# Patient Record
Sex: Male | Born: 1955 | Race: Black or African American | Hispanic: No | Marital: Married
Health system: Southern US, Community
[De-identification: ages and names within clinical notes are randomized; demographics above are authoritative.]

## PROBLEM LIST (undated history)

## (undated) DIAGNOSIS — I639 Cerebral infarction, unspecified: Secondary | ICD-10-CM

## (undated) DIAGNOSIS — D649 Anemia, unspecified: Secondary | ICD-10-CM

## (undated) DIAGNOSIS — Z720 Tobacco use: Secondary | ICD-10-CM

## (undated) DIAGNOSIS — E119 Type 2 diabetes mellitus without complications: Secondary | ICD-10-CM

## (undated) DIAGNOSIS — I509 Heart failure, unspecified: Secondary | ICD-10-CM

## (undated) DIAGNOSIS — N186 End stage renal disease: Secondary | ICD-10-CM

## (undated) DIAGNOSIS — E1143 Type 2 diabetes mellitus with diabetic autonomic (poly)neuropathy: Secondary | ICD-10-CM

## (undated) DIAGNOSIS — J90 Pleural effusion, not elsewhere classified: Secondary | ICD-10-CM

## (undated) DIAGNOSIS — G35 Multiple sclerosis: Secondary | ICD-10-CM

## (undated) HISTORY — PX: VIDEO ASSISTED THORACOSCOPY (VATS)/DECORTICATION: SHX6171

---

## 2019-01-28 ENCOUNTER — Inpatient Hospital Stay (HOSPITAL_COMMUNITY)
Admission: AD | Admit: 2019-01-28 | Discharge: 2019-02-17 | DRG: 186 | Disposition: A | Discharge status: Skilled Nursing Facility | Payer: Medicare PPO | Source: Other Acute Inpatient Hospital | Attending: Internal Medicine | Admitting: Internal Medicine

## 2019-01-28 ENCOUNTER — Encounter (HOSPITAL_COMMUNITY): Payer: Self-pay | Admitting: General Surgery

## 2019-01-28 ENCOUNTER — Inpatient Hospital Stay (HOSPITAL_COMMUNITY): Payer: Medicare PPO

## 2019-01-28 DIAGNOSIS — J948 Other specified pleural conditions: Secondary | ICD-10-CM | POA: Diagnosis present

## 2019-01-28 DIAGNOSIS — J9611 Chronic respiratory failure with hypoxia: Secondary | ICD-10-CM | POA: Diagnosis present

## 2019-01-28 DIAGNOSIS — I5032 Chronic diastolic (congestive) heart failure: Secondary | ICD-10-CM | POA: Diagnosis present

## 2019-01-28 DIAGNOSIS — R6510 Systemic inflammatory response syndrome (SIRS) of non-infectious origin without acute organ dysfunction: Secondary | ICD-10-CM | POA: Diagnosis present

## 2019-01-28 DIAGNOSIS — R4182 Altered mental status, unspecified: Secondary | ICD-10-CM | POA: Diagnosis not present

## 2019-01-28 DIAGNOSIS — Z79899 Other long term (current) drug therapy: Secondary | ICD-10-CM

## 2019-01-28 DIAGNOSIS — R933 Abnormal findings on diagnostic imaging of other parts of digestive tract: Secondary | ICD-10-CM

## 2019-01-28 DIAGNOSIS — K253 Acute gastric ulcer without hemorrhage or perforation: Secondary | ICD-10-CM

## 2019-01-28 DIAGNOSIS — E43 Unspecified severe protein-calorie malnutrition: Secondary | ICD-10-CM | POA: Insufficient documentation

## 2019-01-28 DIAGNOSIS — D509 Iron deficiency anemia, unspecified: Secondary | ICD-10-CM | POA: Diagnosis present

## 2019-01-28 DIAGNOSIS — I160 Hypertensive urgency: Secondary | ICD-10-CM | POA: Diagnosis not present

## 2019-01-28 DIAGNOSIS — K7460 Unspecified cirrhosis of liver: Secondary | ICD-10-CM | POA: Diagnosis present

## 2019-01-28 DIAGNOSIS — G35 Multiple sclerosis: Secondary | ICD-10-CM | POA: Diagnosis present

## 2019-01-28 DIAGNOSIS — J9601 Acute respiratory failure with hypoxia: Secondary | ICD-10-CM | POA: Diagnosis not present

## 2019-01-28 DIAGNOSIS — Z6821 Body mass index (BMI) 21.0-21.9, adult: Secondary | ICD-10-CM | POA: Diagnosis not present

## 2019-01-28 DIAGNOSIS — K5730 Diverticulosis of large intestine without perforation or abscess without bleeding: Secondary | ICD-10-CM | POA: Diagnosis present

## 2019-01-28 DIAGNOSIS — G9340 Encephalopathy, unspecified: Secondary | ICD-10-CM | POA: Diagnosis present

## 2019-01-28 DIAGNOSIS — N2581 Secondary hyperparathyroidism of renal origin: Secondary | ICD-10-CM | POA: Diagnosis present

## 2019-01-28 DIAGNOSIS — G9341 Metabolic encephalopathy: Secondary | ICD-10-CM | POA: Diagnosis present

## 2019-01-28 DIAGNOSIS — E1122 Type 2 diabetes mellitus with diabetic chronic kidney disease: Secondary | ICD-10-CM | POA: Diagnosis present

## 2019-01-28 DIAGNOSIS — J90 Pleural effusion, not elsewhere classified: Secondary | ICD-10-CM

## 2019-01-28 DIAGNOSIS — I132 Hypertensive heart and chronic kidney disease with heart failure and with stage 5 chronic kidney disease, or end stage renal disease: Secondary | ICD-10-CM | POA: Diagnosis present

## 2019-01-28 DIAGNOSIS — E785 Hyperlipidemia, unspecified: Secondary | ICD-10-CM | POA: Diagnosis present

## 2019-01-28 DIAGNOSIS — E876 Hypokalemia: Secondary | ICD-10-CM | POA: Diagnosis present

## 2019-01-28 DIAGNOSIS — K259 Gastric ulcer, unspecified as acute or chronic, without hemorrhage or perforation: Secondary | ICD-10-CM | POA: Diagnosis present

## 2019-01-28 DIAGNOSIS — D631 Anemia in chronic kidney disease: Secondary | ICD-10-CM | POA: Diagnosis present

## 2019-01-28 DIAGNOSIS — A419 Sepsis, unspecified organism: Principal | ICD-10-CM | POA: Diagnosis present

## 2019-01-28 DIAGNOSIS — I10 Essential (primary) hypertension: Secondary | ICD-10-CM | POA: Diagnosis not present

## 2019-01-28 DIAGNOSIS — F17200 Nicotine dependence, unspecified, uncomplicated: Secondary | ICD-10-CM | POA: Diagnosis present

## 2019-01-28 DIAGNOSIS — K3184 Gastroparesis: Secondary | ICD-10-CM | POA: Diagnosis present

## 2019-01-28 DIAGNOSIS — K219 Gastro-esophageal reflux disease without esophagitis: Secondary | ICD-10-CM | POA: Diagnosis present

## 2019-01-28 DIAGNOSIS — K56609 Unspecified intestinal obstruction, unspecified as to partial versus complete obstruction: Secondary | ICD-10-CM | POA: Diagnosis not present

## 2019-01-28 DIAGNOSIS — F0390 Unspecified dementia without behavioral disturbance: Secondary | ICD-10-CM | POA: Diagnosis present

## 2019-01-28 DIAGNOSIS — K551 Chronic vascular disorders of intestine: Secondary | ICD-10-CM | POA: Diagnosis not present

## 2019-01-28 DIAGNOSIS — E1143 Type 2 diabetes mellitus with diabetic autonomic (poly)neuropathy: Secondary | ICD-10-CM | POA: Diagnosis present

## 2019-01-28 DIAGNOSIS — Z20828 Contact with and (suspected) exposure to other viral communicable diseases: Secondary | ICD-10-CM | POA: Diagnosis present

## 2019-01-28 DIAGNOSIS — N186 End stage renal disease: Secondary | ICD-10-CM | POA: Diagnosis not present

## 2019-01-28 DIAGNOSIS — E162 Hypoglycemia, unspecified: Secondary | ICD-10-CM | POA: Diagnosis not present

## 2019-01-28 DIAGNOSIS — E11649 Type 2 diabetes mellitus with hypoglycemia without coma: Secondary | ICD-10-CM | POA: Diagnosis not present

## 2019-01-28 DIAGNOSIS — R112 Nausea with vomiting, unspecified: Secondary | ICD-10-CM | POA: Diagnosis not present

## 2019-01-28 DIAGNOSIS — F329 Major depressive disorder, single episode, unspecified: Secondary | ICD-10-CM | POA: Diagnosis present

## 2019-01-28 DIAGNOSIS — Z992 Dependence on renal dialysis: Secondary | ICD-10-CM

## 2019-01-28 DIAGNOSIS — I69354 Hemiplegia and hemiparesis following cerebral infarction affecting left non-dominant side: Secondary | ICD-10-CM | POA: Diagnosis not present

## 2019-01-28 DIAGNOSIS — Z888 Allergy status to other drugs, medicaments and biological substances status: Secondary | ICD-10-CM

## 2019-01-28 DIAGNOSIS — L8990 Pressure ulcer of unspecified site, unspecified stage: Secondary | ICD-10-CM | POA: Insufficient documentation

## 2019-01-28 DIAGNOSIS — R579 Shock, unspecified: Secondary | ICD-10-CM | POA: Diagnosis not present

## 2019-01-28 DIAGNOSIS — I959 Hypotension, unspecified: Secondary | ICD-10-CM | POA: Diagnosis not present

## 2019-01-28 DIAGNOSIS — Z794 Long term (current) use of insulin: Secondary | ICD-10-CM

## 2019-01-28 DIAGNOSIS — R5381 Other malaise: Secondary | ICD-10-CM | POA: Diagnosis present

## 2019-01-28 DIAGNOSIS — E875 Hyperkalemia: Secondary | ICD-10-CM | POA: Diagnosis present

## 2019-01-28 DIAGNOSIS — Z8711 Personal history of peptic ulcer disease: Secondary | ICD-10-CM

## 2019-01-28 DIAGNOSIS — Z880 Allergy status to penicillin: Secondary | ICD-10-CM

## 2019-01-28 DIAGNOSIS — Z539 Procedure and treatment not carried out, unspecified reason: Secondary | ICD-10-CM | POA: Diagnosis not present

## 2019-01-28 DIAGNOSIS — L89312 Pressure ulcer of right buttock, stage 2: Secondary | ICD-10-CM | POA: Diagnosis present

## 2019-01-28 DIAGNOSIS — E860 Dehydration: Secondary | ICD-10-CM | POA: Diagnosis present

## 2019-01-28 DIAGNOSIS — Z7189 Other specified counseling: Secondary | ICD-10-CM | POA: Diagnosis not present

## 2019-01-28 DIAGNOSIS — Z515 Encounter for palliative care: Secondary | ICD-10-CM | POA: Diagnosis not present

## 2019-01-28 HISTORY — DX: Type 2 diabetes mellitus with diabetic autonomic (poly)neuropathy: E11.43

## 2019-01-28 HISTORY — DX: Tobacco use: Z72.0

## 2019-01-28 HISTORY — DX: Type 2 diabetes mellitus without complications: E11.9

## 2019-01-28 HISTORY — DX: Multiple sclerosis: G35

## 2019-01-28 HISTORY — DX: Pleural effusion, not elsewhere classified: J90

## 2019-01-28 HISTORY — DX: End stage renal disease: N18.6

## 2019-01-28 HISTORY — DX: Cerebral infarction, unspecified: I63.9

## 2019-01-28 HISTORY — DX: Heart failure, unspecified: I50.9

## 2019-01-28 HISTORY — DX: Anemia, unspecified: D64.9

## 2019-01-28 LAB — CBC WITH DIFFERENTIAL/PLATELET
Abs Immature Granulocytes: 0.09 10*3/uL — ABNORMAL HIGH (ref 0.00–0.07)
Basophils Absolute: 0 10*3/uL (ref 0.0–0.1)
Basophils Relative: 0 %
Eosinophils Absolute: 0.3 10*3/uL (ref 0.0–0.5)
Eosinophils Relative: 2 %
HCT: 35 % — ABNORMAL LOW (ref 39.0–52.0)
Hemoglobin: 11 g/dL — ABNORMAL LOW (ref 13.0–17.0)
Immature Granulocytes: 1 %
Lymphocytes Relative: 5 %
Lymphs Abs: 0.7 10*3/uL (ref 0.7–4.0)
MCH: 24.6 pg — ABNORMAL LOW (ref 26.0–34.0)
MCHC: 31.4 g/dL (ref 30.0–36.0)
MCV: 78.3 fL — ABNORMAL LOW (ref 80.0–100.0)
Monocytes Absolute: 1.8 10*3/uL — ABNORMAL HIGH (ref 0.1–1.0)
Monocytes Relative: 11 %
Neutro Abs: 12.6 10*3/uL — ABNORMAL HIGH (ref 1.7–7.7)
Neutrophils Relative %: 81 %
Platelets: 263 10*3/uL (ref 150–400)
RBC: 4.47 MIL/uL (ref 4.22–5.81)
RDW: 13.3 % (ref 11.5–15.5)
WBC: 15.5 10*3/uL — ABNORMAL HIGH (ref 4.0–10.5)
nRBC: 0.1 % (ref 0.0–0.2)

## 2019-01-28 LAB — GLUCOSE, CAPILLARY
Glucose-Capillary: 129 mg/dL — ABNORMAL HIGH (ref 70–99)
Glucose-Capillary: 124 mg/dL — ABNORMAL HIGH (ref 70–99)
Glucose-Capillary: 104 mg/dL — ABNORMAL HIGH (ref 70–99)
Glucose-Capillary: 146 mg/dL — ABNORMAL HIGH (ref 70–99)
Glucose-Capillary: 162 mg/dL — ABNORMAL HIGH (ref 70–99)

## 2019-01-28 LAB — COMPREHENSIVE METABOLIC PANEL
ALT: 20 U/L (ref 0–44)
AST: 52 U/L — ABNORMAL HIGH (ref 15–41)
Albumin: 2.7 g/dL — ABNORMAL LOW (ref 3.5–5.0)
Alkaline Phosphatase: 68 U/L (ref 38–126)
Anion gap: 23 — ABNORMAL HIGH (ref 5–15)
BUN: 76 mg/dL — ABNORMAL HIGH (ref 8–23)
CO2: 28 mmol/L (ref 22–32)
Calcium: 9 mg/dL (ref 8.9–10.3)
Chloride: 85 mmol/L — ABNORMAL LOW (ref 98–111)
Creatinine, Ser: 9.24 mg/dL — ABNORMAL HIGH (ref 0.61–1.24)
GFR calc Af Amer: 6 mL/min — ABNORMAL LOW (ref >60)
GFR calc non Af Amer: 5 mL/min — ABNORMAL LOW (ref >60)
Glucose, Bld: 132 mg/dL — ABNORMAL HIGH (ref 70–99)
Potassium: 5.3 mmol/L — ABNORMAL HIGH (ref 3.5–5.1)
Sodium: 136 mmol/L (ref 135–145)
Total Bilirubin: 1.5 mg/dL — ABNORMAL HIGH (ref 0.3–1.2)
Total Protein: 7.1 g/dL (ref 6.5–8.1)

## 2019-01-28 LAB — CORTISOL: Cortisol, Plasma: 21.9 ug/dL

## 2019-01-28 LAB — MRSA PCR SCREENING: MRSA by PCR: NEGATIVE

## 2019-01-28 LAB — MAGNESIUM: Magnesium: 2.2 mg/dL (ref 1.7–2.4)

## 2019-01-28 LAB — SARS CORONAVIRUS 2 BY RT PCR (HOSPITAL ORDER, PERFORMED IN ~~LOC~~ HOSPITAL LAB): SARS Coronavirus 2: NEGATIVE

## 2019-01-28 LAB — ECHOCARDIOGRAM COMPLETE
Height: 71 in
Weight: 2024.7 oz

## 2019-01-28 LAB — PHOSPHORUS: Phosphorus: 8.8 mg/dL — ABNORMAL HIGH (ref 2.5–4.6)

## 2019-01-28 LAB — PROTIME-INR
INR: 1.6 — ABNORMAL HIGH (ref 0.8–1.2)
Prothrombin Time: 18.4 seconds — ABNORMAL HIGH (ref 11.4–15.2)

## 2019-01-28 LAB — PROCALCITONIN: Procalcitonin: 7.08 ng/mL

## 2019-01-28 LAB — VANCOMYCIN, RANDOM: Vancomycin Rm: 30

## 2019-01-28 LAB — LACTIC ACID, PLASMA: Lactic Acid, Venous: 1.1 mmol/L (ref 0.5–1.9)

## 2019-01-28 MED ORDER — INSULIN ASPART 100 UNIT/ML ~~LOC~~ SOLN
0.0000 [IU] | SUBCUTANEOUS | Status: DC
  Administered 2019-01-28: 2 [IU] via SUBCUTANEOUS
  Administered 2019-01-28 (×2): 1 [IU] via SUBCUTANEOUS
  Administered 2019-01-28: 2 [IU] via SUBCUTANEOUS
  Administered 2019-01-29 (×4): 1 [IU] via SUBCUTANEOUS
  Administered 2019-01-30: 2 [IU] via SUBCUTANEOUS
  Administered 2019-01-30 (×2): 1 [IU] via SUBCUTANEOUS
  Administered 2019-01-31 (×2): 2 [IU] via SUBCUTANEOUS
  Administered 2019-01-31: 3 [IU] via SUBCUTANEOUS
  Administered 2019-02-01 (×5): 1 [IU] via SUBCUTANEOUS
  Administered 2019-02-02: 3 [IU] via SUBCUTANEOUS
  Administered 2019-02-02: 1 [IU] via SUBCUTANEOUS
  Administered 2019-02-02: 2 [IU] via SUBCUTANEOUS
  Administered 2019-02-03: 5 [IU] via SUBCUTANEOUS
  Administered 2019-02-03: 2 [IU] via SUBCUTANEOUS
  Administered 2019-02-03: 1 [IU] via SUBCUTANEOUS
  Administered 2019-02-04: 3 [IU] via SUBCUTANEOUS
  Administered 2019-02-04 – 2019-02-05 (×2): 1 [IU] via SUBCUTANEOUS
  Administered 2019-02-05: 2 [IU] via SUBCUTANEOUS
  Administered 2019-02-05: 3 [IU] via SUBCUTANEOUS
  Administered 2019-02-06 – 2019-02-07 (×4): 2 [IU] via SUBCUTANEOUS
  Administered 2019-02-07: 1 [IU] via SUBCUTANEOUS
  Administered 2019-02-07: 2 [IU] via SUBCUTANEOUS
  Administered 2019-02-08: 1 [IU] via SUBCUTANEOUS
  Administered 2019-02-08: 2 [IU] via SUBCUTANEOUS
  Administered 2019-02-08 – 2019-02-09 (×2): 1 [IU] via SUBCUTANEOUS
  Administered 2019-02-09 – 2019-02-10 (×4): 2 [IU] via SUBCUTANEOUS
  Administered 2019-02-10 (×2): 1 [IU] via SUBCUTANEOUS
  Administered 2019-02-10: 2 [IU] via SUBCUTANEOUS
  Administered 2019-02-10: 1 [IU] via SUBCUTANEOUS
  Administered 2019-02-11 – 2019-02-12 (×4): 2 [IU] via SUBCUTANEOUS
  Administered 2019-02-12: 1 [IU] via SUBCUTANEOUS
  Administered 2019-02-12: 2 [IU] via SUBCUTANEOUS
  Administered 2019-02-13 (×2): 1 [IU] via SUBCUTANEOUS
  Administered 2019-02-13: 2 [IU] via SUBCUTANEOUS
  Administered 2019-02-13: 1 [IU] via SUBCUTANEOUS
  Administered 2019-02-14: 2 [IU] via SUBCUTANEOUS
  Administered 2019-02-14: 1 [IU] via SUBCUTANEOUS

## 2019-01-28 MED ORDER — ACETAMINOPHEN 650 MG RE SUPP: 650.0000 mg | Freq: Four times a day (QID) | RECTAL | Status: DC | PRN

## 2019-01-28 MED ORDER — CHLORHEXIDINE GLUCONATE CLOTH 2 % EX PADS
6.0000 | MEDICATED_PAD | Freq: Every day | CUTANEOUS | Status: DC
  Administered 2019-01-29 – 2019-02-07 (×9): 6 via TOPICAL

## 2019-01-28 MED ORDER — PANTOPRAZOLE SODIUM 40 MG IV SOLR
40.0000 mg | INTRAVENOUS | Status: DC
  Administered 2019-01-28 – 2019-01-31 (×4): 40 mg via INTRAVENOUS
  Filled 2019-01-28 (×5): qty 40

## 2019-01-28 MED ORDER — VANCOMYCIN VARIABLE DOSE PER UNSTABLE RENAL FUNCTION (PHARMACIST DOSING): Status: DC

## 2019-01-28 MED ORDER — DEXTROSE-NACL 5-0.9 % IV SOLN
INTRAVENOUS | Status: DC
  Administered 2019-01-28: 1 mL via INTRAVENOUS
  Administered 2019-01-28 – 2019-01-29 (×3): via INTRAVENOUS

## 2019-01-28 MED ORDER — HEPARIN SODIUM (PORCINE) 5000 UNIT/ML IJ SOLN
5000.0000 [IU] | Freq: Three times a day (TID) | INTRAMUSCULAR | Status: DC
  Administered 2019-01-28 – 2019-02-17 (×60): 5000 [IU] via SUBCUTANEOUS
  Filled 2019-01-28 (×59): qty 1

## 2019-01-28 MED ORDER — PROMETHAZINE HCL 25 MG/ML IJ SOLN: 12.5000 mg | Freq: Four times a day (QID) | INTRAMUSCULAR | Status: DC | PRN

## 2019-01-28 MED ORDER — CHLORHEXIDINE GLUCONATE CLOTH 2 % EX PADS
6.0000 | MEDICATED_PAD | Freq: Every day | CUTANEOUS | Status: DC
  Administered 2019-01-29: 6 via TOPICAL

## 2019-01-28 MED ORDER — ORAL CARE MOUTH RINSE
15.0000 mL | Freq: Two times a day (BID) | OROMUCOSAL | Status: DC
  Administered 2019-01-28 – 2019-02-16 (×35): 15 mL via OROMUCOSAL

## 2019-01-28 MED ORDER — ONDANSETRON HCL 4 MG/2ML IJ SOLN
4.0000 mg | Freq: Four times a day (QID) | INTRAMUSCULAR | Status: DC | PRN
  Administered 2019-02-17: 4 mg via INTRAVENOUS
  Filled 2019-01-28: qty 2

## 2019-01-28 MED ORDER — METOCLOPRAMIDE HCL 5 MG/ML IJ SOLN
5.0000 mg | Freq: Four times a day (QID) | INTRAMUSCULAR | Status: DC
  Administered 2019-01-28 – 2019-01-31 (×12): 5 mg via INTRAVENOUS
  Filled 2019-01-28 (×11): qty 2

## 2019-01-28 MED ORDER — LEVOFLOXACIN IN D5W 500 MG/100ML IV SOLN
500.0000 mg | INTRAVENOUS | Status: AC
  Administered 2019-01-28 – 2019-02-03 (×4): 500 mg via INTRAVENOUS
  Filled 2019-01-28 (×5): qty 100

## 2019-01-28 MED ORDER — IOHEXOL 300 MG/ML  SOLN
100.0000 mL | Freq: Once | INTRAMUSCULAR | Status: AC | PRN
  Administered 2019-01-28: 100 mL via INTRAVENOUS

## 2019-01-28 MED ORDER — NYSTATIN 100000 UNIT/ML MT SUSP
5.0000 mL | Freq: Four times a day (QID) | OROMUCOSAL | Status: DC
  Administered 2019-01-28 – 2019-02-17 (×70): 500000 [IU] via ORAL
  Filled 2019-01-28 (×70): qty 5

## 2019-01-28 NOTE — Progress Notes (Signed)
Initial Nutrition Assessment  DOCUMENTATION CODES:   Underweight, Severe malnutrition in context of chronic illness  INTERVENTION:   - If SMA is confirmed, recommend placement of a Cortrak tube with tube tip terminating in the jejunum/at the Ligament of Treitz and initiation of enteral nutrition  - If unable to start enteral nutrition, recommend TPN as pt is severely malnourished  Recommend: - Vital 1.5 @ 55 ml/hr (1320 ml/day)  Tube feeding regimen provides 1980 kcal, 89 grams of protein, and 1008 ml of H2O (100% of needs).  NUTRITION DIAGNOSIS:   Severe Malnutrition related to chronic illness (ESRD on HD, CHF) as evidenced by severe muscle depletion, severe fat depletion.  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 PMH of ESRD on HD, DM, diabetic gastroparesis, GERD, HTN, CHF, MS, CVA with residual left-sided weakness, HLD, anemia.   Discussed pt with RN and during ICU rounds.  Pt currently with NG tube which is clamped at this time.  Per Vascular Surgery note, treatment for SMA would be an attempt at post-pyloric feeding and general surgery evaluation for possible gastrojejunostomy or duodenojejunostomy.  Reviewed Surgery note. CT A/P has been ordered to evaluate for SMA.  Pt lethargic at RD time of visit and was not able to provide any history. Per chart review, pt experiences emesis 3-4 times monthly PTA and had been experiencing emesis at outside hospital.  No weight history in chart.  BP (cuff): 94/44 MAP (cuff):  60  Medications reviewed and include: SSI q 4 hours, Reglan 5 mg q 6 hours, Protonix IVF: D5 in NS @ 75 ml/hr (provides 306 kcal daily from dextrose)  Labs reviewed: potassium 5.3, phosphorus 8.8 CBG's: 104, 124, 129  NUTRITION - FOCUSED PHYSICAL EXAM:    Most Recent Value  Orbital Region  Severe depletion  Upper Arm Region  Severe depletion  Thoracic and Lumbar Region  Severe depletion  Buccal Region  Moderate depletion  Temple Region  Severe depletion  Clavicle Bone Region  Severe depletion  Clavicle and Acromion Bone Region  Severe depletion  Scapular Bone Region  Severe depletion  Dorsal Hand  Moderate depletion  Patellar Region  Moderate depletion  Anterior Thigh Region  Severe depletion  Posterior Calf Region  Severe depletion  Edema (RD Assessment)  None  Hair  Reviewed  Eyes  Reviewed  Mouth  Reviewed  Skin  Reviewed  Nails  Reviewed       Diet Order:   Diet Order            Diet NPO time specified  Diet effective now              EDUCATION NEEDS:   Not appropriate for education at this time  Skin:  Skin Assessment: Skin Integrity Issues: Stage II: right buttocks  Last BM:  no documented BM  Height:   Ht Readings from Last 1 Encounters:  01/28/19 5\' 11"  (1.803 m)    Weight:   Wt Readings from Last 1 Encounters:  01/28/19 57.4 kg    Ideal Body Weight:  78.2 kg  BMI:  Body mass index is 17.65 kg/m.  Estimated Nutritional Needs:   Kcal:  1800-2000  Protein:  80-95 grams  Fluid:  >/= 1.8 L    Gaynell Face, MS, RD, LDN Inpatient Clinical Dietitian Pager: [phone] Weekend/After Hours: [phone]

## 2019-01-28 NOTE — Progress Notes (Signed)
Pt transported to/from CT without complications.   Pt comfortable & resting in bed.

## 2019-01-28 NOTE — Progress Notes (Signed)
eLink Physician-Brief Progress Note Patient Name: Cole Nelson DOB: 03/07/1956 MRN: [id number]   Date of Service  01/28/2019  HPI/Events of Note  A 63 year old male transferred to the ICU because of acute respiratory failure.   eICU Interventions  Respiratory failure secondary to lung mass and recurrent pleural effusion.  Evaluate the mass further.  Notified critical care nurse practitioner Kennieth Rad.      Intervention Category Major Interventions: Respiratory failure - evaluation and management Intermediate Interventions: Communication with other healthcare providers and/or family  Mady Gemma 01/28/2019, 12:15 AM

## 2019-01-28 NOTE — Progress Notes (Signed)
Pharmacy Antibiotic Note  Cole Nelson is a 63 y.o. male admitted on 01/28/2019 with PNA from OSH. Pharmacy has been consulted for levofloxacin and vancomycin dosing. Pt has hx ESRD on HD TTS, unclear last HD session.  Plans noted for HD on 7/24 -pre HD Vancomycin level elevated at 30 mcg/ml -.WBC= 15, PCT= 7   Plan: -Continue Levofloxacin 500mg  IV q48h  -No vancomycin now -Will plan to check a vancomycin level post HD on 7/24    Height: 5\' 11"  (180.3 cm) Weight: 126 lb 8.7 oz (57.4 kg) IBW/kg (Calculated) : 75.3  Temp (24hrs), Avg:98.8 F (37.1 C), Min:98.2 F (36.8 C), Max:99.5 F (37.5 C)  Recent Labs  Lab 01/28/19 0546 01/28/19 0604 01/28/19 0948  WBC 15.5*  --   --   CREATININE 9.24*  --   --   LATICACIDVEN  --   --  1.1  VANCORANDOM  --  30  --     Estimated Creatinine Clearance: 6.7 mL/min (A) (by C-G formula based on SCr of 9.24 mg/dL (H)).    Allergies  Allergen Reactions  . Baclofen     confusion  . Cefepime     Confusion, hives   . Penicillins       Thank you for allowing pharmacy to be a part of this patient's care.  Hildred Laser, PharmD Clinical Pharmacist **Pharmacist phone directory can now be found on [email or web address] ([email or web address]).  Listed under [place].

## 2019-01-28 NOTE — Consult Note (Signed)
Hospital Consult    Reason for Consult:  SMA syndrome Referring Physician:  ICU, critical care MRN #:  [id number]  History of Present Illness: This is a 63 y.o. male that presents as a transfer from [facility] where he has been hospitalized since 7/11 for chronic pleural effusion status post multiple VATS with possible concern for underlying neoplasia.  During his hospitalization has had reportedly recurrent bouts of nausea and vomiting.  He has NG tube in place at this time.  CT scan was obtained with concern for marked gastric distention and dilation of the second third portions of the duodenum.  There is concern for narrowing at the hiatus of the aorta and SMA.  Vascular surgery consulted for SMA syndrome.  On my evaluation patient reports at least 1 week of nausea and vomiting particularly worse when he tried to eat or drink anything.  He states he did not have any issues prior to his hospitalization.  He does report significant weight loss over the last week but cannot tell me what that would be.  Currently has an NG tube and denying any other abdominal pain at baseline when he is resting in his room.  He is on dialysis.  Denies any abdominal surgery.  PMH: ESRD on HD, left arm brachiocephalic AVF  PSH: VATS  Allergies  Allergen Reactions  . Baclofen     confusion  . Cefepime     Confusion, hives   . Penicillins     Prior to Admission medications   Not on File    Social History   Socioeconomic History  . Marital status: Married    Spouse name: Not on file  . Number of children: Not on file  . Years of education: Not on file  . Highest education level: Not on file  Occupational History  . Not on file  Social Needs  . Financial resource strain: Not on file  . Food insecurity    Worry: Not on file    Inability: Not on file  . Transportation needs    Medical: Not on file    Non-medical: Not on file  Tobacco Use  . Smoking status: Not on file  Substance  and Sexual Activity  . Alcohol use: Not on file  . Drug use: Not on file  . Sexual activity: Not on file  Lifestyle  . Physical activity    Days per week: Not on file    Minutes per session: Not on file  . Stress: Not on file  Relationships  . Social Herbalist on phone: Not on file    Gets together: Not on file    Attends religious service: Not on file    Active member of club or organization: Not on file    Attends meetings of clubs or organizations: Not on file    Relationship status: Not on file  . Intimate partner violence    Fear of current or ex partner: Not on file    Emotionally abused: Not on file    Physically abused: Not on file    Forced sexual activity: Not on file  Other Topics Concern  . Not on file  Social History Narrative  . Not on file     No family history on file.  ROS: [x]  Positive   [ ]  Negative   [ ]  All sytems reviewed and are negative  Cardiovascular: []  chest pain/pressure []  palpitations []  SOB lying flat []  DOE []  pain in  legs while walking []  pain in legs at rest []  pain in legs at night []  non-healing ulcers []  hx of DVT []  swelling in legs  Pulmonary: []  productive cough []  asthma/wheezing []  home O2  Neurologic: []  weakness in []  arms []  legs []  numbness in []  arms []  legs []  hx of CVA []  mini stroke [] difficulty speaking or slurred speech []  temporary loss of vision in one eye []  dizziness  Hematologic: []  hx of cancer []  bleeding problems []  problems with blood clotting easily  Endocrine:   []  diabetes []  thyroid disease  GI []  vomiting blood []  blood in stool xabdominal pain, vomiting GU: []  CKD/renal failure []  HD--[]  M/W/F or []  T/T/S []  burning with urination []  blood in urine  Psychiatric: []  anxiety []  depression  Musculoskeletal: []  arthritis []  joint pain  Integumentary: []  rashes []  ulcers  Constitutional: []  fever []  chills   Physical Examination  Vitals:   01/28/19  0700 01/28/19 0821  BP: (!) 114/52   Pulse: (!) 102   Resp: 18   Temp:  99.5 F (37.5 C)  SpO2: 100%    Body mass index is 17.65 kg/m.  General:  WDWN in NAD Gait: Not observed HENT: WNL, normocephalic, NG tube in place Pulmonary: normal non-labored breathing, without Rales, rhonchi,  wheezing Cardiac: regular, without  Murmurs, rubs or gallops Abdomen: soft, NT/ND, no masses; no rebound or guarding Skin: without rashes Vascular Exam/Pulses:  Right Left  Radial 2+ (normal) 2+ (normal)  Ulnar    Femoral 2+ (normal) 2+ (normal)  Popliteal    DP Not palpable Not palpable  PT Not palpable Not palpable   Extremities: without ischemic changes, without Gangrene , without cellulitis; without open wounds;  Musculoskeletal: no muscle wasting or atrophy  Neurologic: A&O X 3; Appropriate Affect ; SENSATION: normal; MOTOR FUNCTION:  moving all extremities equally. Speech is fluent/normal   CBC    Component Value Date/Time   WBC 15.5 (H) 01/28/2019 0546   RBC 4.47 01/28/2019 0546   HGB 11.0 (L) 01/28/2019 0546   HCT 35.0 (L) 01/28/2019 0546   PLT 263 01/28/2019 0546   MCV 78.3 (L) 01/28/2019 0546   MCH 24.6 (L) 01/28/2019 0546   MCHC 31.4 01/28/2019 0546   RDW 13.3 01/28/2019 0546   LYMPHSABS 0.7 01/28/2019 0546   MONOABS 1.8 (H) 01/28/2019 0546   EOSABS 0.3 01/28/2019 0546   BASOSABS 0.0 01/28/2019 0546    BMET    Component Value Date/Time   NA 136 01/28/2019 0546   K 5.3 (H) 01/28/2019 0546   CL 85 (L) 01/28/2019 0546   CO2 28 01/28/2019 0546   GLUCOSE 132 (H) 01/28/2019 0546   BUN 76 (H) 01/28/2019 0546   CREATININE 9.24 (H) 01/28/2019 0546   CALCIUM 9.0 01/28/2019 0546   GFRNONAA 5 (L) 01/28/2019 0546   GFRAA 6 (L) 01/28/2019 0546    COAGS: No results found for: INR, PROTIME   Non-Invasive Vascular Imaging:    There are no CT images in I-site.  There is no disc in the patient's chart from transfer.  There is a CT abdomen pelvis IV contrast report from  01/23/2019 done at [facility] that states market gastric distention with dilation of the second and proximal third portions of the duodenum with what appears to be narrowing at the level of the hiatus between the aorta and superior mesenteric artery.   ASSESSMENT/PLAN: This is a 63 y.o. male that presents as a transfer from [place] where he was admitted  with shortness of breath/hypoxia/chronic pleural effusions requiring multiple VATS and possible concern for underlying neoplasia.    Vascular surgery was called for SMA syndrome given CT findings at OSH and nausea/vomiting.  Discussed with patient as well as critical care that this is not typically a vascular surgery problem.  The treatment would be an attempt at transpyloric feeding with a transplyoric tube.  If he tolerates this he would benefit from general surgery evaluation and options would include gastrojejunostomy or duodenojejunostomy.  We do not stent the SMA or move the SMA as part of the treatment plan for SMA syndrome.  Marty Heck, MD [facility] Office: [phone] Pager: [phone]

## 2019-01-28 NOTE — H&P (Signed)
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  Transferred to [facility] for further tertiary evaluation and management.   History of present illness   HPI obtained from medical chart review as patient is encephalopathic.   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  At baseline lives with wife, independent of ADLs, and occasionally uses walker/ wheelchair for mobility.   Admitted on 7/11 to [facility] with acute shortness of breath and hypertensive urgency.  Found on admit to have complete opacification of left hemithorax.  Previous thoracentesis in September 02 2018 was transudative.  Underwent left thoracentesis on July 11 with 2300 ml of dark red fluid removed, found to be exudative with cytology negative for neoplasm.  Underwent left VATS on 7/13 with placement of chest tube found to have pleural pulsatile masses.  CT chest then performed which showed consolidation or mass along the pleural surface of the anterior lateral left upper lobe.  The case was discussed with cardiothoracic at [facility] with some question that this was acute consolidation verses rather than a mass and recommended further outpatient follow up after completion of antibiotic therapy.  Patient was continued on levofloxacin.    During his hospitalization,  patient with recurrent bouts of nausea and vomiting in which wife had reported that patient has been having 3 to 4 times a month over the last year.  CT abdomen obtained showed marked gastric distention with dilation of the 2nd and proximal 3rd portions of the duodenum possible narrowing at the level of the hiatus between the aorta and superior mesenteric artery consistent with SMA syndrome.  An nasal gastric tube was placed, pulled out by patient and since replaced.  No nasal jejunostomy tubes available there and has remained NPO. Additionally, patient with with SIRS and fever with worsening confusion vs delirum.  Vancomycin was added. Some recent bouts of hypotension improved after unclear amount of fluid boluses.  Given his chronic and acute issues, it was recommended he be transferred to a tertiary center for further evaluation and management.  PCCM accepted to ICU given recent hemodynamic instability.   Past Medical History  ESRD on TTS- iHD- LUE AVF, DM, tobacco abuse, diabetic gastroparesis, GERD, HTN, diastolic HF, multiple sclerosis, CVA w/residual left sided weakness, chronic left pleural effusion, hx cdiff, diabetic neuropathy, anemia, HLD, depression  Significant Hospital Events   7/11 Admitted to [facility] left thoracentesis  7/13 left VATS/ chest tube  7/19 left CT removed  7/23 transferred to [facility]  Consults:   Procedures:   Significant Diagnostic Tests:   Micro Data:  OSH micro see paperwork  7/23 BC x 2 >>  Antimicrobials:  levaquin >> 7/20 vancomycin >>  Interim history/subjective:  No complaints from patient except for sore throat  Objective   Blood pressure 127/65, pulse (!) 103, temperature 98.2 F (36.8 C), temperature source Axillary, resp. rate 18, SpO2 100 %.  No intake or output data in the 24 hours ending 01/28/19 0225 There were no vitals filed for this visit.  Examination: General:  Thin older appearing male in NAD HEENT: MM  pink/moist- heavy film coating to tongue-wipes off, pupils 4/reactive, anicteric, R NGT clamped Neuro: Awake, confused, oriented to self and hospital, MAE- generalized weakness CV: rr ir, no murmur, +2 pulses PULM: even/non-labored, lungs bilaterally diminished GI: flat, ND/ NT, +bs Extremities: warm/dry, no edema, LUE AVF +b/t Skin: no rashes, sacral pressure ulcer  Resolved Hospital Problem list    Assessment & Plan:   Chronic pleural effusions, s/p possible ex vacuo at left base s/p thoracentesis Chronic Hypoxic respiratory failure Possible pulsatile pleural mass vs consolidation  - s/p left thora 09/02/2018 and 01/16/2019 w/ CT placement 7/13- 7/19 P:  Supplemental O2 for goal sat 88-94% CXR now Aggressive pulm hygiene  Continue advair, singulair Consider TCTS eval vs repeat imaging w/completion of abx; some concern with vascularity of mass like lesion at OHS.    Fevers P:  Follow cultures from OSH Send BCx 2 here and UC (if able) CBC / trend WBC Continue levaquin (unclear when started)/ vanc empirically (will need to clarify MAR and culture results at OHS)   Hx HTN with recent hypotension episodes Diastolic HF- currently not volume overloaded  P: Holding home norvasc, imdur, coreg, clonidine, losartan, and hydralazine for now, monitor MAP and then restart some in am if stable Daily ASA, lipitor when MAR clarified    ESRD on TTS P:  Consult nephrology in am CMP now    Acute encephalopathy- unclear etiology of delirium vs other etiology/ ?sepsis P:  Monitor See above Pending MAR review  Recurrent nausea/ vomiting thought secondary to SMA syndrome P:  Continue NGT clamped NPO Consult general surgery in am for further recommendations   Chronic anemia P:  CBC now   DM P:  CBG q 4 SSI sensitive while NPO  Multiple sclerosis P:  Possibly on copaxone MWF- will need to clarify MAR  GERD P:  PPI   Best practice:  Diet: NPO Pain/Anxiety/Delirium  protocol (if indicated): n/a VAP protocol (if indicated): n/a DVT prophylaxis: heparin SQ GI prophylaxis: PPI Glucose control: SSI Mobility: PT/ OT Code Status: full  Family Communication: not called at night Disposition: ICU, likely can transfer out in am if stable  Labs   CBC: No results for input(s): WBC, NEUTROABS, HGB, HCT, MCV, PLT in the last 168 hours.  Basic Metabolic Panel: No results for input(s): NA, K, CL, CO2, GLUCOSE, BUN, CREATININE, CALCIUM, MG, PHOS in the last 168 hours. GFR: CrCl cannot be calculated (No successful lab value found.). No results for input(s): PROCALCITON, WBC, LATICACIDVEN in the last 168 hours.  Liver Function Tests: No results for input(s): AST, ALT, ALKPHOS, BILITOT, PROT, ALBUMIN in the last 168 hours. No results for input(s): LIPASE, AMYLASE in the last 168 hours. No results for input(s): AMMONIA in the last 168 hours.  ABG No results found for: PHART, PCO2ART, PO2ART, HCO3, TCO2, ACIDBASEDEF, O2SAT   Coagulation Profile: No results for input(s): INR, PROTIME in the last 168 hours.  Cardiac Enzymes: No results for input(s): CKTOTAL, CKMB, CKMBINDEX, TROPONINI in the last 168 hours.  HbA1C: No results found for: HGBA1C  CBG: No results for input(s): GLUCAP in the last 168 hours.  Review of Systems:   Limited given patient's confusion.  Currently only complaint is throat pain.   Past Medical History  He,  has no past medical history on  file.   Surgical History   unable  Social History    unable  Family History   His family history is not on file.   Allergies Allergies  Allergen Reactions   Baclofen     confusion   Cefepime     Confusion, hives    Penicillins      Home Medications  Prior to Admission medications   Not on File          Kennieth Rad, MSN, AGACNP-BC [facility] Pgr: [phone] or if no answer [phone] 01/28/2019, 3:32 AM

## 2019-01-28 NOTE — Progress Notes (Signed)
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  Transferred to [facility] for further tertiary evaluation and management.   Examination: General:  Lying in bed, no distress  HEENT: MMM, NG in place Neuro: opens eyes to verbal/physical stimulation, oriented x 2, follows commands  CV: Tachy, no MRG  PULM: Diminished breath sounds, no wheeze/crackles  GI: non-tender, hypoactive bowel sounds  Extremities: warm/dry, no edema, LUE AVF +b/t Skin: clean, dry, intact   Recurrent N/V concern for SMA syndrome, CT A/P with marked gastric distention and dilation of the 2nd and proximal 3rd portions of the duodenum possible narrowing at the level of the hiatus between the aorta and superior mesenteric artery consistent with SMA syndrome Plan -Maintain NPO -NG to LWS  -General Surgery and Vascular Consulted  -Continue scheduled Reglan and Protonix   Chronic Pleural Effusions s/p Left VATS, Concern for Pleural Mass vs Infectious Consolidation  -Pleural Fluid cytology with no evidence of neoplasia  Plan  -Continue Vancomycin/Levaquin >> Follow Culture Data > Plans to continue antibiotic course follow up with repeat imaging  -Currently on 3L [place], Titrate to Maintain Oxygen Saturation >92 -Pulmonary Hygiene   ESRD, HD TRS Plan -Consulted Nephrology   If patient remains stable will transfer to progressive care with [facility] this afternoon   Hayden Pedro, AGACNP-BC [facility] Pgr: [phone]

## 2019-01-28 NOTE — Plan of Care (Signed)
  Problem: Clinical Measurements: ?Goal: Will remain free from infection ?Outcome: Progressing ?Goal: Diagnostic test results will improve ?Outcome: Progressing ?Goal: Respiratory complications will improve ?Outcome: Progressing ?Goal: Cardiovascular complication will be avoided ?Outcome: Progressing ?  ?Problem: Coping: ?Goal: Level of anxiety will decrease ?Outcome: Progressing ?  ?Problem: Elimination: ?Goal: Will not experience complications related to urinary retention ?Outcome: Progressing ?  ?Problem: Pain Managment: ?Goal: General experience of comfort will improve ?Outcome: Progressing ?  ?Problem: Safety: ?Goal: Ability to remain free from injury will improve ?Outcome: Progressing ?  ?Problem: Skin Integrity: ?Goal: Risk for impaired skin integrity will decrease ?Outcome: Progressing ?  ?

## 2019-01-28 NOTE — Progress Notes (Addendum)
Pharmacy Antibiotic Note  Cole Nelson is a 63 y.o. male admitted on 01/28/2019 with PNA from OSH. Pharmacy has been consulted for levofloxacin and vancomycin dosing. Pt has hx ESRD on HD TTS, unclear last HD session.   Plan: -Levofloxacin 500mg  IV q48h  -Will hold vancomycin for now until RRT plans established - check stat vancomycin level   ADDENDUM: Vancomycin level elevated at 30 mcg/ml.  Plan: -Hold vancomycin for now, F/U HD plans      Temp (24hrs), Avg:98.2 F (36.8 C), Min:98.2 F (36.8 C), Max:98.2 F (36.8 C)  No results for input(s): WBC, CREATININE, LATICACIDVEN, VANCOTROUGH, VANCOPEAK, VANCORANDOM, GENTTROUGH, GENTPEAK, GENTRANDOM, TOBRATROUGH, TOBRAPEAK, TOBRARND, AMIKACINPEAK, AMIKACINTROU, AMIKACIN in the last 168 hours.  CrCl cannot be calculated (No successful lab value found.).    Allergies  Allergen Reactions  . Baclofen     confusion  . Cefepime     Confusion, hives   . Penicillins       Thank you for allowing pharmacy to be a part of this patient's care.   Arrie Senate, PharmD, BCPS Clinical Pharmacist Please check AMION for all [facility] Pharmacy numbers 01/28/2019

## 2019-01-28 NOTE — Consult Note (Signed)
Cole Nelson [birth year]  [id number].    Requesting MD: Dr. Londell Moh Chief Complaint/Reason for Consult: N/V ? SMA syndrome  HPI:  This is a 63 yo black male with significant medical history for which he has been in [place] for the last several weeks secondary to dyspnea secondary to a left pleural effusion.  All history is from the chart as the patient is unable to provide any significant history himself.  He ultimately underwent a VATS and was found to likely have a pulsatile mass on the left side.  He has had a chest tube and subsequently removed.  Apparently at some point during his stay, he developed N/V.  He had an NGT placed for one day and then removed.  N/V returned and another NGT was placed.  This remains in place upon arrival here but is not to suction.  He currently denies N/V.  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  In the chart, it states his wife has said he has had episodes of emesis 3-4x/month for the last year.  He was transferred here to our facility for further evaluation of both problems.  ROS: ROS: unable to obtain secondary to AMS, encephalopathy  History reviewed. No pertinent family history.  Past Medical History:  Diagnosis Date  . Anemia   . CHF (congestive heart failure) ([place])   . CVA (cerebral vascular accident) ([place])   . Diabetes mellitus without complication ([place])   . ESRD (end stage renal disease) on dialysis ([place])   . Gastroparesis due to DM ([place])   . Multiple sclerosis ([place])   . Recurrent left pleural effusion   . Tobacco abuse     Past Surgical History:  Procedure Laterality Date  . VIDEO ASSISTED THORACOSCOPY (VATS)/DECORTICATION      Social History:  has no history on file for tobacco, alcohol, and drug.  Allergies:  Allergies  Allergen Reactions  . Baclofen     confusion  . Cefepime     Confusion, hives    . Penicillins     No medications prior to admission.     Physical Exam: Blood pressure (!) 108/40, pulse (!) 106, temperature 99 F (37.2 C), temperature source Axillary, resp. rate 18, height 5\' 11"  (1.803 m), weight 57.4 kg, SpO2 100 %. General: sleepy, lethargic, frail appearing black male who is laying in bed in NAD HEENT: head is normocephalic, atraumatic.  Sclera are noninjected.  PERRL.  Ears and nose without any masses or lesions.  NGT in place but clamped off.  Mouth is pink  Heart: regular, rate, and rhythm, but mildly tachy.  Normal s1,s2. No obvious murmurs, gallops, or rubs noted.  Palpable radial and pedal pulses bilaterally Lungs: CTAB but bilaterally diminished, no wheezes, rhonchi, or rales noted.  Respiratory effort nonlabored Abd: soft, NT, ND, +BS, no masses, hernias, or organomegaly Skin: warm and dry with no masses, lesions, or rashes Psych: lethargic.  Difficult to fully assess mentation as he mostly falls back to sleep   Results for orders placed or performed during the hospital encounter of 01/28/19 (from the past 48 hour(s))  MRSA PCR Screening     Status: None   Collection Time: 01/28/19 12:22 AM   Specimen: Nasopharyngeal  Result Value Ref Range   MRSA by PCR NEGATIVE NEGATIVE    Comment:        The GeneXpert  MRSA Assay (FDA approved for NASAL specimens only), is one component of a comprehensive MRSA colonization surveillance program. It is not intended to diagnose MRSA infection nor to guide or monitor treatment for MRSA infections. Performed at [facility], [place] [address] [place], [place] [address]   Glucose, capillary     Status: Abnormal   Collection Time: 01/28/19  2:30 AM  Result Value Ref Range   Glucose-Capillary 129 (H) 70 - 99 mg/dL  Comprehensive metabolic panel     Status: Abnormal   Collection Time: 01/28/19  5:46 AM  Result Value Ref Range   Sodium 136 135 - 145 mmol/L   Potassium 5.3 (H) 3.5 - 5.1 mmol/L   Chloride 85  (L) 98 - 111 mmol/L   CO2 28 22 - 32 mmol/L   Glucose, Bld 132 (H) 70 - 99 mg/dL   BUN 76 (H) 8 - 23 mg/dL   Creatinine, Ser 9.24 (H) 0.61 - 1.24 mg/dL   Calcium 9.0 8.9 - 10.3 mg/dL   Total Protein 7.1 6.5 - 8.1 g/dL   Albumin 2.7 (L) 3.5 - 5.0 g/dL   AST 52 (H) 15 - 41 U/L   ALT 20 0 - 44 U/L   Alkaline Phosphatase 68 38 - 126 U/L   Total Bilirubin 1.5 (H) 0.3 - 1.2 mg/dL   GFR calc non Af Amer 5 (L) >60 mL/min   GFR calc Af Amer 6 (L) >60 mL/min   Anion gap 23 (H) 5 - 15    Comment: RESULT CHECKED Performed at [facility], [place] [address], [place], [place] [address]   Magnesium     Status: None   Collection Time: 01/28/19  5:46 AM  Result Value Ref Range   Magnesium 2.2 1.7 - 2.4 mg/dL    Comment: Performed at [facility] [address], [place], [place] [address]  Phosphorus     Status: Abnormal   Collection Time: 01/28/19  5:46 AM  Result Value Ref Range   Phosphorus 8.8 (H) 2.5 - 4.6 mg/dL    Comment: Performed at [facility] [address] [place], [place] [address]  Procalcitonin     Status: None   Collection Time: 01/28/19  5:46 AM  Result Value Ref Range   Procalcitonin 7.08 ng/mL    Comment:        Interpretation: PCT > 2 ng/mL: Systemic infection (sepsis) is likely, unless other causes are known. (NOTE)       Sepsis PCT Algorithm           Lower Respiratory Tract                                      Infection PCT Algorithm    ----------------------------     ----------------------------         PCT < 0.25 ng/mL                PCT < 0.10 ng/mL         Strongly encourage             Strongly discourage   discontinuation of antibiotics    initiation of antibiotics    ----------------------------     -----------------------------       PCT 0.25 - 0.50 ng/mL            PCT 0.10 - 0.25 ng/mL  OR       >80% decrease in PCT            Discourage initiation of                                            antibiotics      Encourage  discontinuation           of antibiotics    ----------------------------     -----------------------------         PCT >= 0.50 ng/mL              PCT 0.26 - 0.50 ng/mL               AND       <80% decrease in PCT              Encourage initiation of                                             antibiotics       Encourage continuation           of antibiotics    ----------------------------     -----------------------------        PCT >= 0.50 ng/mL                  PCT > 0.50 ng/mL               AND         increase in PCT                  Strongly encourage                                      initiation of antibiotics    Strongly encourage escalation           of antibiotics                                     -----------------------------                                           PCT <= 0.25 ng/mL                                                 OR                                        > 80% decrease in PCT                                     Discontinue / Do not initiate  antibiotics Performed at [facility], [place] [address], [place], [place] [address]   CBC WITH DIFFERENTIAL     Status: Abnormal   Collection Time: 01/28/19  5:46 AM  Result Value Ref Range   WBC 15.5 (H) 4.0 - 10.5 K/uL   RBC 4.47 4.22 - 5.81 MIL/uL   Hemoglobin 11.0 (L) 13.0 - 17.0 g/dL   HCT 35.0 (L) 39.0 - 52.0 %   MCV 78.3 (L) 80.0 - 100.0 fL   MCH 24.6 (L) 26.0 - 34.0 pg   MCHC 31.4 30.0 - 36.0 g/dL   RDW 13.3 11.5 - 15.5 %   Platelets 263 150 - 400 K/uL   nRBC 0.1 0.0 - 0.2 %   Neutrophils Relative % 81 %   Neutro Abs 12.6 (H) 1.7 - 7.7 K/uL   Lymphocytes Relative 5 %   Lymphs Abs 0.7 0.7 - 4.0 K/uL   Monocytes Relative 11 %   Monocytes Absolute 1.8 (H) 0.1 - 1.0 K/uL   Eosinophils Relative 2 %   Eosinophils Absolute 0.3 0.0 - 0.5 K/uL   Basophils Relative 0 %   Basophils Absolute 0.0 0.0 - 0.1 K/uL   Immature Granulocytes 1 %   Abs Immature  Granulocytes 0.09 (H) 0.00 - 0.07 K/uL    Comment: Performed at [facility], [address], [place], [place] [address]  Cortisol     Status: None   Collection Time: 01/28/19  6:04 AM  Result Value Ref Range   Cortisol, Plasma 21.9 ug/dL    Comment: (NOTE) AM    6.7 - 22.6 ug/dL PM   <10.0       ug/dL Performed at [facility] [address], [place], [place] [address]   Vancomycin, random     Status: None   Collection Time: 01/28/19  6:04 AM  Result Value Ref Range   Vancomycin Rm 30     Comment:        Random Vancomycin therapeutic range is dependent on dosage and time of specimen collection. A peak range is 20.0-40.0 ug/mL A trough range is 5.0-15.0 ug/mL        Performed at [facility] [address], [place],  [address]   Glucose, capillary     Status: Abnormal   Collection Time: 01/28/19  8:18 AM  Result Value Ref Range   Glucose-Capillary 124 (H) 70 - 99 mg/dL  SARS Coronavirus 2 (CEPHEID - Performed in [place] hospital lab), Hosp Order     Status: None   Collection Time: 01/28/19  8:58 AM   Specimen: Nasopharyngeal Swab  Result Value Ref Range   SARS Coronavirus 2 NEGATIVE NEGATIVE    Comment: (NOTE) If result is NEGATIVE SARS-CoV-2 target nucleic acids are NOT DETECTED. The SARS-CoV-2 RNA is generally detectable in upper and lower  respiratory specimens during the acute phase of infection. The lowest  concentration of SARS-CoV-2 viral copies this assay can detect is 250  copies / mL. A negative result does not preclude SARS-CoV-2 infection  and should not be used as the sole basis for treatment or other  patient management decisions.  A negative result may occur with  improper specimen collection / handling, submission of specimen other  than nasopharyngeal swab, presence of viral mutation(s) within the  areas targeted by this assay, and inadequate number of viral copies  (<250 copies / mL). A negative result must be combined  with clinical  observations, patient history, and epidemiological  information. If result is POSITIVE SARS-CoV-2 target nucleic acids are DETECTED. The SARS-CoV-2 RNA is generally detectable in upper and lower  respiratory specimens dur ing the acute phase of infection.  Positive  results are indicative of active infection with SARS-CoV-2.  Clinical  correlation with patient history and other diagnostic information is  necessary to determine patient infection status.  Positive results do  not rule out bacterial infection or co-infection with other viruses. If result is PRESUMPTIVE POSTIVE SARS-CoV-2 nucleic acids MAY BE PRESENT.   A presumptive positive result was obtained on the submitted specimen  and confirmed on repeat testing.  While 2019 novel coronavirus  (SARS-CoV-2) nucleic acids may be present in the submitted sample  additional confirmatory testing may be necessary for epidemiological  and / or clinical management purposes  to differentiate between  SARS-CoV-2 and other Sarbecovirus currently known to infect humans.  If clinically indicated additional testing with an alternate test  methodology [phone]) is advised. The SARS-CoV-2 RNA is generally  detectable in upper and lower respiratory sp ecimens during the acute  phase of infection. The expected result is Negative. Fact Sheet for Patients:  StrictlyIdeas.no Fact Sheet for Healthcare Providers: BankingDealers.co.za This test is not yet approved or cleared by the [place] FDA and has been authorized for detection and/or diagnosis of SARS-CoV-2 by FDA under an Emergency Use Authorization (EUA).  This EUA will remain in effect (meaning this test can be used) for the duration of the COVID-19 declaration under Section 564(b)(1) of the Act, 21 U.S.C. section 360bbb-3(b)(1), unless the authorization is terminated or revoked sooner. Performed at [facility], [place] [address], [place], [place] [address]   Protime-INR     Status: Abnormal   Collection Time: 01/28/19  9:48 AM  Result Value Ref Range   Prothrombin Time 18.4 (H) 11.4 - 15.2 seconds   INR 1.6 (H) 0.8 - 1.2    Comment: (NOTE) INR goal varies based on device and disease states. Performed at [facility], [place] [address] [place], [place] [address]   Lactic acid, plasma     Status: None   Collection Time: 01/28/19  9:48 AM  Result Value Ref Range   Lactic Acid, Venous 1.1 0.5 - 1.9 mmol/L    Comment: Performed at [facility] [address] [place], [place] [address]  Glucose, capillary     Status: Abnormal   Collection Time: 01/28/19 11:59 AM  Result Value Ref Range   Glucose-Capillary 104 (H) 70 - 99 mg/dL   Dg Chest Port 1 View  Result Date: 01/28/2019 CLINICAL DATA:  Pleural effusion and left basilar pneumothorax EXAM: PORTABLE CHEST 1 VIEW COMPARISON:  None available FINDINGS: Enteric tube tip at the stomach with side port at the GE junction. Gas collection at the left base. There is history of recent VATs with no comparison imaging available in this patient who is a recent transfer. Mild left-sided lung opacity apically. The right lung is clear. Normal heart size. Left axillary stenting. Mild left axillary soft tissue emphysema IMPRESSION: 1. Moderate gas collection at the left base in this patient with history of basal pneumothorax. 2. Nonspecific left pulmonary opacity. Electronically Signed   By: Monte Fantasia M.D.   On: 01/28/2019 08:05      Assessment/Plan MMP  N/V, ? SMA syndrome The patient has had a CT scan at [facility] questioning possible SMA syndrome.  We do not have any imaging here to review.  CT A/P will  be ordered so we can have imaging to review.  If it does appear he may have SMA syndrome appearance, we will likely start with a GI consult for an endoscopy to rule out an intramural source of obstruction prior to proceeding with a potential bypass.   The other issue is he may have a pulmonary malignancy and other medical issues that make him a poor candidate for surgery at this time.  We will proceed with work up and may further recommendations as more information is obtained.   FEN - NPO/NGT clamped fow now VTE - heparin ID - vanc/levaquin  Henreitta Cea, [facility] 01/28/2019, 12:34 PM Pager: [phone]

## 2019-01-28 NOTE — Progress Notes (Signed)
Assisted tele visit to patient with daughter.  Neyland Pettengill Anderson, RN   

## 2019-01-28 NOTE — Consult Note (Addendum)
Reason for Consult: Continuity of ESRD care Referring Physician: Baltazar Apo, MD (CCM)  HPI:  62 year old African-American man with past medical history significant for congestive heart failure, CVA with left-sided hemiparesis, diabetes mellitus, multiple sclerosis, baseline dementia (per his wife) and end-stage renal disease on hemodialysis on a TTS schedule at the [facility] unit in [place].  He was transferred to [facility] from [place] with tertiary evaluation and management of chronic left pleural effusion status post VATS with questionable pulsating mass and concerns of SMA syndrome for which he has an NG tube for gastric decompression.  Earlier during his hospitalization at [facility], he had hypertensive urgency however more recently following recurrent episodes of nausea/vomiting, has been found to be hypotensive for which he was admitted to the ICU.  Unfortunately the patient is not able to contribute to his history and information is obtained from review of chart and calling his wife.    Dialysis Prescription: TTS, Optiflux 180, EDW 77kg, 3hr 21min, BFR 450/DFR 800, 2K/2.5Ca, Epogen 10,000 units TIW, Hectorol 20mcg TIW, ONS, LUA AVF 15G, Standard Sodium, Heparin 2000units load and 700units/hr  Past Medical History:  Diagnosis Date  . Anemia   . CHF (congestive heart failure) ([place])   . CVA (cerebral vascular accident) ([place])   . Diabetes mellitus without complication ([place])   . ESRD (end stage renal disease) on dialysis ([place])   . Gastroparesis due to DM ([place])   . Multiple sclerosis ([place])   . Recurrent left pleural effusion   . Tobacco abuse     Past Surgical History:  Procedure Laterality Date  . VIDEO ASSISTED THORACOSCOPY (VATS)/DECORTICATION      History reviewed. No pertinent family history.  Social History:  has no history on file for tobacco, alcohol, and drug.  Allergies:  Allergies  Allergen Reactions  . Baclofen     confusion  . Cefepime     Confusion,  hives   . Penicillins     Medications:  Scheduled: . Chlorhexidine Gluconate Cloth  6 each Topical Daily  . heparin  5,000 Units Subcutaneous Q8H  . insulin aspart  0-9 Units Subcutaneous Q4H  . mouth rinse  15 mL Mouth Rinse BID  . metoCLOPramide (REGLAN) injection  5 mg Intravenous Q6H  . pantoprazole (PROTONIX) IV  40 mg Intravenous Q24H  . vancomycin variable dose per unstable renal function (pharmacist dosing)   Does not apply See admin instructions    BMP Latest Ref Rng & Units 01/28/2019  Glucose 70 - 99 mg/dL 132(H)  BUN 8 - 23 mg/dL 76(H)  Creatinine 0.61 - 1.24 mg/dL 9.24(H)  Sodium 135 - 145 mmol/L 136  Potassium 3.5 - 5.1 mmol/L 5.3(H)  Chloride 98 - 111 mmol/L 85(L)  CO2 22 - 32 mmol/L 28  Calcium 8.9 - 10.3 mg/dL 9.0   CBC    Component Value Date/Time   WBC 15.5 (H) 01/28/2019 0546   RBC 4.47 01/28/2019 0546   HGB 11.0 (L) 01/28/2019 0546   HCT 35.0 (L) 01/28/2019 0546   PLT 263 01/28/2019 0546   MCV 78.3 (L) 01/28/2019 0546   MCH 24.6 (L) 01/28/2019 0546   MCHC 31.4 01/28/2019 0546   RDW 13.3 01/28/2019 0546   LYMPHSABS 0.7 01/28/2019 0546   MONOABS 1.8 (H) 01/28/2019 0546   EOSABS 0.3 01/28/2019 0546   BASOSABS 0.0 01/28/2019 0546     Dg Chest Port 1 View  Result Date: 01/28/2019 CLINICAL DATA:  Pleural effusion and left basilar pneumothorax EXAM: PORTABLE CHEST 1 VIEW COMPARISON:  None available FINDINGS: Enteric tube tip at the stomach with side port at the GE junction. Gas collection at the left base. There is history of recent VATs with no comparison imaging available in this patient who is a recent transfer. Mild left-sided lung opacity apically. The right lung is clear. Normal heart size. Left axillary stenting. Mild left axillary soft tissue emphysema IMPRESSION: 1. Moderate gas collection at the left base in this patient with history of basal pneumothorax. 2. Nonspecific left pulmonary opacity. Electronically Signed   By: Monte Fantasia M.D.   On:  01/28/2019 08:05    Review of Systems  Unable to perform ROS: Mental acuity   Blood pressure (!) 94/44, pulse (!) 104, temperature 99 F (37.2 C), temperature source Axillary, resp. rate 17, height 5\' 11"  (1.803 m), weight 57.4 kg, SpO2 100 %. Physical Exam  Nursing note and vitals reviewed. Constitutional: He appears well-developed. No distress.  HENT:  Head: Normocephalic and atraumatic.  Mouth/Throat: Oropharynx is clear and moist.  Eyes: Pupils are equal, round, and reactive to light. Conjunctivae are normal. No scleral icterus.  Neck: Normal range of motion. Neck supple. No JVD present.  Cardiovascular: Regular rhythm.  Murmur heard. 2/6 HSM  Respiratory: Effort normal.  Decreased breath sounds over bases-poor inspiratory effort  GI: Soft. Bowel sounds are normal. There is no abdominal tenderness. There is no rebound.  NG tube in situ  Musculoskeletal:        General: No edema.     Comments: Left brachiocephalic fistula-palpable thrill and bruit  Neurological: He is alert.  Skin: Skin is warm and dry.    Assessment/Plan: 1.  End-stage renal disease on hemodialysis: Usually on a Tuesday/Thursday/Saturday schedule.  Mildly hypotensive today with borderline hyperkalemia of 5.3.  Will evaluate for the possibility of dialysis today or tomorrow based on staffing/ability to run here in ICU.  His volume status is euvolemic to possibly hypovolemic from recent compromised oral intake.  Agree with maintenance IV fluids 50-75 cc an hour based on NG tube output. 2.  Hypotension: We will obtain records to review usual outpatient blood pressures at dialysis to determine safety for continued hemodialysis today versus postpone until tomorrow. 3.  Hyperkalemia: Mild, without oral intake and will manage with dialysis. 4.  Anemia of chronic kidney disease: Hemoglobin/hematocrit within acceptable range, no overt loss. 5.  Secondary hyperparathyroidism: Significant hyperphosphatemia noted with  hypercalcemia when corrected for his albuminemia.  Reevaluate use of binders based on disposition with NG tube/diet. 6.  Protein calorie malnutrition: Awaiting additional recommendations by surgery to determine source of nutrition. 7.  SMA syndrome with recurrent nausea/vomiting 8.  Recurrent pleural effusion   Taryn Shellhammer K. 01/28/2019, 1:10 PM

## 2019-01-28 NOTE — Progress Notes (Signed)
Patient pulled out NGT.  Attempted x2 to reinsert.

## 2019-01-28 NOTE — Progress Notes (Signed)
  Echocardiogram 2D Echocardiogram has been performed.  Cole Nelson 01/28/2019, 9:53 AM

## 2019-01-29 DIAGNOSIS — R933 Abnormal findings on diagnostic imaging of other parts of digestive tract: Secondary | ICD-10-CM

## 2019-01-29 DIAGNOSIS — R112 Nausea with vomiting, unspecified: Secondary | ICD-10-CM

## 2019-01-29 DIAGNOSIS — E43 Unspecified severe protein-calorie malnutrition: Secondary | ICD-10-CM | POA: Insufficient documentation

## 2019-01-29 LAB — BASIC METABOLIC PANEL
Anion gap: 21 — ABNORMAL HIGH (ref 5–15)
BUN: 84 mg/dL — ABNORMAL HIGH (ref 8–23)
CO2: 28 mmol/L (ref 22–32)
Calcium: 9 mg/dL (ref 8.9–10.3)
Chloride: 86 mmol/L — ABNORMAL LOW (ref 98–111)
Creatinine, Ser: 10.08 mg/dL — ABNORMAL HIGH (ref 0.61–1.24)
GFR calc Af Amer: 6 mL/min — ABNORMAL LOW (ref >60)
GFR calc non Af Amer: 5 mL/min — ABNORMAL LOW (ref >60)
Glucose, Bld: 84 mg/dL (ref 70–99)
Potassium: 3.1 mmol/L — ABNORMAL LOW (ref 3.5–5.1)
Sodium: 135 mmol/L (ref 135–145)

## 2019-01-29 LAB — CBC
HCT: 25.6 % — ABNORMAL LOW (ref 39.0–52.0)
Hemoglobin: 8.2 g/dL — ABNORMAL LOW (ref 13.0–17.0)
MCH: 24.7 pg — ABNORMAL LOW (ref 26.0–34.0)
MCHC: 32 g/dL (ref 30.0–36.0)
MCV: 77.1 fL — ABNORMAL LOW (ref 80.0–100.0)
Platelets: 311 10*3/uL (ref 150–400)
RBC: 3.32 MIL/uL — ABNORMAL LOW (ref 4.22–5.81)
RDW: 13.2 % (ref 11.5–15.5)
WBC: 15.2 10*3/uL — ABNORMAL HIGH (ref 4.0–10.5)
nRBC: 0 % (ref 0.0–0.2)

## 2019-01-29 LAB — PHOSPHORUS: Phosphorus: 6.8 mg/dL — ABNORMAL HIGH (ref 2.5–4.6)

## 2019-01-29 LAB — GLUCOSE, CAPILLARY
Glucose-Capillary: 125 mg/dL — ABNORMAL HIGH (ref 70–99)
Glucose-Capillary: 134 mg/dL — ABNORMAL HIGH (ref 70–99)
Glucose-Capillary: 149 mg/dL — ABNORMAL HIGH (ref 70–99)
Glucose-Capillary: 150 mg/dL — ABNORMAL HIGH (ref 70–99)
Glucose-Capillary: 81 mg/dL (ref 70–99)
Glucose-Capillary: 89 mg/dL (ref 70–99)

## 2019-01-29 LAB — HIV ANTIBODY (ROUTINE TESTING W REFLEX): HIV Screen 4th Generation wRfx: NONREACTIVE

## 2019-01-29 LAB — MAGNESIUM: Magnesium: 2.1 mg/dL (ref 1.7–2.4)

## 2019-01-29 MED ORDER — POTASSIUM CHLORIDE 10 MEQ/100ML IV SOLN
10.0000 meq | Freq: Once | INTRAVENOUS | Status: AC
  Administered 2019-01-29: 10 meq via INTRAVENOUS
  Filled 2019-01-29: qty 100

## 2019-01-29 MED ORDER — PRO-STAT SUGAR FREE PO LIQD
30.0000 mL | Freq: Two times a day (BID) | ORAL | Status: DC
  Administered 2019-01-29 – 2019-02-16 (×37): 30 mL via ORAL
  Filled 2019-01-29 (×37): qty 30

## 2019-01-29 MED ORDER — BOOST / RESOURCE BREEZE PO LIQD CUSTOM
1.0000 | Freq: Three times a day (TID) | ORAL | Status: DC
  Administered 2019-01-29 – 2019-02-03 (×14): 1 via ORAL
  Filled 2019-01-29 (×13): qty 1

## 2019-01-29 MED ORDER — SODIUM CHLORIDE 0.9 % IV BOLUS: 500.0000 mL | Freq: Once | INTRAVENOUS | Status: DC

## 2019-01-29 MED ORDER — SODIUM CHLORIDE 0.9 % IV BOLUS: 1000.0000 mL | Freq: Once | INTRAVENOUS | Status: AC

## 2019-01-29 NOTE — H&P (View-Only) (Signed)
[facility] Consult: 11:50 AM 01/29/2019  LOS: 1 day    Referring Provider: Dr Tarri Abernethy CCM resident.    Primary Care Physician:  Sammuel Bailiff.  Primary Gastroenterologist: Unassigned patient transferred from [facility].  General Surgeon, Dr Sherryl Manges, is "GI" there.   Wife is [name] Leggitt cell phone [phone].  Home phone [phone]. Daughter Sunny Schlein phone [phone], she is a physical therapist.    Reason for Consultation: Nausea, vomiting.   HPI: Malike Foglio is a 63 y.o. male.  Hx CVA.  CHF.  Multiple sclerosis.    Multiple recurrences of hemorrhagic pleural effusion, transudate of effusion on thoracentesis 08/2018.  ESRD on dialysis TTS.  IDDM.  Diabetic neuropathy.  Diastolic heart failure Anemia.  C. difficile colitis.  GERD.   S/p Fundoplication  ~9244.  Ccolonoscopy and EGD ~ 06/2018 vs 07/2018, unremarkable per wife.   Hospitalized starting 7/11 through 7/23 at [facility] for hypoxia, shortness of breath, chronic pleural effusion.Thoracentesis with exudative effusion, cytology negative for cancer.  VATS, chest tube placement 7/13.  Postop CT chest revealed consolidation versus mass at the left upper lobe pleura.  After discussing case with specialist at [facility] it was felt this was probably acute consolidation rather than a mass. Hospitalization complicated by Sirs, fever, confusion/delirium, hypotension.  For at least a year patient has been having episodes of nausea and vomiting 3 or 4 times a month lasting 1/2 to all day, then resolves.  This got worse when he was in the hospital.  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    Due to the complexity of care and question of SMA syndrome, the patient was transferred to [place] repeated on 7/23 revealing large, left hydropneumothorax.  Mild compression at the third duodenum incompletely evaluated due to decompression by NG tube.  Incidental diverticulosis and a small right pleural effusion Patient has been seen by both vascular and general surgery.  Vascular surgery deferred any interventions to general surgery.  Dr.Tsuei is not impressed that pt has SMA syndrome, suggests pt undergo EGD/Enteroscopy to r/o mucosal dz/obstruction.   Labs here revealed initial potassium of 5.3, 3.1 today. Abnormal BUN and creatinine as expected in ESRD patient. Microcytic anemia.  Hemoglobins 8.2, MCV 77.1.  Hgb 01/20/19: 10.4. PT/INR 18.4/1.6. T bili 1.5, alkaline phosphatase 68, AST/ALT 52/20. COVID 19 negative Echocardiogram with LVEF greater than 65%.  Additional GI symptoms include chronic constipation with bowel movements every 1 to 2 weeks.  Takes Amitiza infrequently because when he does, it causes loose stools and incontinence.  Rare minor bleeding per rectum after a bowel movement.  Appetite variable.  Sometimes it is good sometimes it is poor.  He is gone from a size 42 size 32 inch waist in the last year. GI meds at home include omeprazole 20 mg daily, ferrous sulfate 325 mg daily dicyclomine and Phenergan prn, Amitiza prn.  Receives Epogen at hemodialysis  center.    Past Medical History:  Diagnosis Date   Anemia    CHF (congestive heart failure) (HCC)    CVA (cerebral vascular accident) ([place])    Diabetes mellitus without complication ([place])    ESRD (end stage renal disease) on dialysis ([place])    Gastroparesis due to DM ([place])    Multiple sclerosis (HCC)    Recurrent left pleural effusion    Tobacco abuse     Past Surgical History:  Procedure Laterality Date   VIDEO ASSISTED THORACOSCOPY (VATS)/DECORTICATION       Prior to Admission medications   Not on File    Scheduled Meds:  Chlorhexidine Gluconate Cloth  6 each Topical Daily   Chlorhexidine Gluconate Cloth  6 each Topical Q0600   feeding supplement  1 Container Oral TID BM   feeding supplement (PRO-STAT SUGAR FREE 64)  30 mL Oral BID   heparin  5,000 Units Subcutaneous Q8H   insulin aspart  0-9 Units Subcutaneous Q4H   mouth rinse  15 mL Mouth Rinse BID   metoCLOPramide (REGLAN) injection  5 mg Intravenous Q6H   nystatin  5 mL Oral QID   pantoprazole (PROTONIX) IV  40 mg Intravenous Q24H   vancomycin variable dose per unstable renal function (pharmacist dosing)   Does not apply See admin instructions   Infusions:  dextrose 5 % and 0.9% NaCl 75 mL/hr at 01/29/19 0957   levofloxacin (LEVAQUIN) IV Stopped (01/28/19 0517)   PRN Meds: acetaminophen, ondansetron (ZOFRAN) IV, promethazine   Allergies as of 01/27/2019   (Not on File)    History reviewed. No pertinent family history.  Social History   Socioeconomic History   Marital status: Married    Spouse name: Not on file   Number of children: Not on file   Years of education: Not on file   Highest education level: Not on file  Occupational History   Not on file  Social Needs   Financial resource strain: Not on file   Food insecurity    Worry: Not on file    Inability: Not on file   Transportation needs    Medical: Not on file    Non-medical: Not on file  Tobacco Use   Smoking status: Not on file  Substance and Sexual Activity   Alcohol use: Not on file   Drug use: Not on file   Sexual activity: Not on file  Lifestyle   Physical activity    Days per week: Not on file    Minutes per session: Not on file   Stress: Not on file  Relationships   Social connections    Talks on phone: Not on file    Gets together: Not on file    Attends religious service: Not on file    Active member of club or organization: Not on file    Attends  meetings of clubs or organizations: Not on file    Relationship status: Not on file   Intimate partner violence    Fear of current or ex partner: Not on file    Emotionally abused: Not on file    Physically abused: Not on file    Forced sexual activity: Not on file  Other Topics Concern   Not on file  Social History Narrative   Not on file    REVIEW OF SYSTEMS: Constitutional: Weakness ENT:  No nose bleeds Pulm: Per HPI. CV:  No palpitations, no LE edema.  No chest pain GU:  No hematuria,  no frequency.  Oliguric but still makes urine. GI: See HPI. Heme: No bleeding from the skin, mouth, nose. Transfusions: No previous transfusions. Neuro:  No headaches, no peripheral tingling or numbness.  Ambulates with walker and wheelchair at home Derm:  No itching, no rash or sores.  Endocrine:  No sweats or chills.  No polyuria or dysuria Immunization: Not queried. Travel:  None beyond local counties in last few months.    PHYSICAL EXAM: Vital signs in last 24 hours: Vitals:   01/29/19 0800 01/29/19 0900  BP: (!) 154/58 (!) 115/56  Pulse: (!) 102 99  Resp: 15 17  Temp:    SpO2: 96% 96%   Wt Readings from Last 3 Encounters:  01/29/19 57.7 kg    General: Frail, chronically ill looking AAM, resting comfortably in bed. Head: No signs of head trauma.  No facial asymmetry. Eyes: No scleral icterus, no conjunctival pallor.  Ears: No obvious hearing deficit. Nose: No congestion, some dried crusty discharge at the opening of the nares. Mouth: No teeth.  Oral mucosa pink, moist, clear.  Tongue midline. Neck: No JVD, no masses, no thyromegaly. Lungs: Coarse breath sounds bilaterally.  No labored breathing. Heart: RRR.  Normal S1, S2.  2/6 systolic murmur Abdomen: Soft, not tender or distended..   Rectal: Not performed. Musc/Skeltl: Muscular wasting.  No contracture deformities. Extremities: No CCE.  AV fistula in the left upper extremity. Neurologic: Left-sided paresis.  No  tremors.  Alert.  Poor historian.  Oriented to year. Skin: No rash, no sores.  Skin warm and dry   Intake/Output from previous day: 07/23 0701 - 07/24 0700 In: 2053.5 [I.V.:1353.5; NG/GT:600; IV Piggyback:100] Out: 75 [Emesis/NG output:75] Intake/Output this shift: Total I/O In: 175 [I.V.:75.1; IV Piggyback:100] Out: 125 [Emesis/NG output:125]  LAB RESULTS: Recent Labs    01/28/19 0546 01/29/19 0309  WBC 15.5* 15.2*  HGB 11.0* 8.2*  HCT 35.0* 25.6*  PLT 263 311   BMET Lab Results  Component Value Date   NA 135 01/29/2019   NA 136 01/28/2019   K 3.1 (L) 01/29/2019   K 5.3 (H) 01/28/2019   CL 86 (L) 01/29/2019   CL 85 (L) 01/28/2019   CO2 28 01/29/2019   CO2 28 01/28/2019   GLUCOSE 84 01/29/2019   GLUCOSE 132 (H) 01/28/2019   BUN 84 (H) 01/29/2019   BUN 76 (H) 01/28/2019   CREATININE 10.08 (H) 01/29/2019   CREATININE 9.24 (H) 01/28/2019   CALCIUM 9.0 01/29/2019   CALCIUM 9.0 01/28/2019   LFT Recent Labs    01/28/19 0546  PROT 7.1  ALBUMIN 2.7*  AST 52*  ALT 20  ALKPHOS 68  BILITOT 1.5*   PT/INR Lab Results  Component Value Date   INR 1.6 (H) 01/28/2019    RADIOLOGY STUDIES: Ct Abdomen Pelvis W Contrast  Result Date: 01/28/2019 CLINICAL DATA:  Abdominal pain, history of possible SMA syndrome on outside CT. EXAM: CT ABDOMEN AND PELVIS WITH CONTRAST TECHNIQUE: Multidetector CT imaging of the abdomen and pelvis was performed using the standard protocol following bolus administration of intravenous contrast. CONTRAST:  163mL OMNIPAQUE IOHEXOL 300 MG/ML  SOLN COMPARISON:  Plain film from earlier in the same day. FINDINGS: Lower chest: Right lung base demonstrates small right pleural effusion with basilar atelectasis. There is a large left basilar hydropneumothorax similar to that seen on recent chest x-ray. By history this is of a chronic nature. Hepatobiliary: Vicarious excretion of contrast is noted within the gallbladder. The liver is within normal limits.  Pancreas: Unremarkable. No pancreatic ductal dilatation or surrounding inflammatory changes. Spleen: Normal in size without focal abnormality. Adrenals/Urinary Tract: Adrenal glands are within normal limits. Kidneys demonstrates scattered cystic changes bilaterally without obstructive change. No definitive renal calculi are seen. The bladder is decompressed. Stomach/Bowel: Diverticular change of the colon is noted without evidence of diverticulitis. No obstructive or inflammatory changes are seen. The appendix is not well visualized. No inflammatory changes are identified to suggest appendicitis. The stomach is decompressed by a nasogastric catheter. The space between the SMA and aorta is mildly narrowed with some compression upon the third portion of the duodenum although no proximal dilatation is seen due to the gastric drainage catheter. The angle of the SMA and aorta appears within normal limits. Vascular/Lymphatic: Aortic atherosclerosis. No enlarged abdominal or pelvic lymph nodes. Reproductive: Prostate is unremarkable. Other: No abdominal wall hernia or abnormality. No abdominopelvic ascites. Musculoskeletal: Degenerative changes of the lumbar spine IMPRESSION: Large left hydropneumothorax which by history is chronic in nature. Normal appearing angle between the aorta and superior mesenteric artery although there is some mild compression upon the third portion of the duodenum on the axial images. The degree of obstruction is incompletely evaluated due to decompression by the nasogastric catheter. Comparison with the previous images would be helpful if they could be made available. Diverticulosis without diverticulitis. Small right-sided pleural effusion. Electronically Signed   By: Inez Catalina M.D.   On: 01/28/2019 21:04   Dg Chest Port 1 View  Result Date: 01/28/2019 CLINICAL DATA:  Pleural effusion and left basilar pneumothorax EXAM: PORTABLE CHEST 1 VIEW COMPARISON:  None available FINDINGS: Enteric  tube tip at the stomach with side port at the GE junction. Gas collection at the left base. There is history of recent VATs with no comparison imaging available in this patient who is a recent transfer. Mild left-sided lung opacity apically. The right lung is clear. Normal heart size. Left axillary stenting. Mild left axillary soft tissue emphysema IMPRESSION: 1. Moderate gas collection at the left base in this patient with history of basal pneumothorax. 2. Nonspecific left pulmonary opacity. Electronically Signed   By: Monte Fantasia M.D.   On: 01/28/2019 08:05    IMPRESSION:   *   Acute worsening of nausea, vomiting since hospitalization with left pleural effusion. 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   Carries diagnosis of diabetic gastroparesis and GERD.  Fundoplication by Dr Juliann Mule in [place] ~ 2017/18  *     Chronic constipation.  Has had colonoscopy along with EGD within the last 9 months  *     Anemia of chronic disease.  On Epogen at hemodialysis.  *    S/p 01/18/2019 VATS to address left pleural effusion  *     ESRD.  Undergoes dialysis TTS though headed to HD now on Friday.    *      Microcytic anemia.  *   Hypokalemia.  *       Protein calorie malnutrition.  RD evaluation today recommends supplements with boost TID and pro-stat twice daily.  Current diet is clear liquids.  *      DM2.     *    Multiple sclerosis.   PLAN:     *   Enteroscopy tomorrow?   Dr Hilarie Fredrickson to decide.  Will place orders in depot.    *   Continue the current Reglan 5 mg IV q  6 hours, Protonix 40 mg IV/24 hours   Azucena Freed  01/29/2019, 11:50 AM Phone [phone]

## 2019-01-29 NOTE — Progress Notes (Signed)
Transfer Note:   Traveling Method:bed Transferring Unit: Mental Orientation: alert but confused  Telemetry: yes  Assessment: complete  Skin: see assessment IV: RUA  Pain:  Tubes: none Safety Measures: Safety Fall Prevention Plan has been given, discussed and signed Admission: Completed 15M Orientation: Patient has been orientated to the room, unit and staff.  Family:  Transferring Incident:   Orders have been reviewed and implemented. Will continue to monitor the patient. Call light has been placed within reach and bed alarm has been activated.  Shela Commons, RN

## 2019-01-29 NOTE — Progress Notes (Signed)
NAME:  Cole Nelson, MRN:  [id number], DOB:  Mar 09, 1956, LOS: 1 ADMISSION DATE:  01/28/2019, CONSULTATION DATE:  01/27/2019 REFERRING MD:  [facility], CHIEF COMPLAINT:  Pleural effusions/ possible SMA syndrome  Brief History   63 year old male hospitalized at [facility] since 7/11 for SOB, hypoxia, chronic pleural effusion s/p left VATs with questionable pulsating mass and ongoing N/V with concern for SMA syndrome, and recent worsening SIRS with encephalopathy.  Transferred to [facility] for further tertiary evaluation and management.   History of present illness   HPI obtained from medical chart review as patient is encephalopathic.   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  At baseline lives with wife, independent of ADLs, and occasionally uses walker/ wheelchair for mobility.   Admitted on 7/11 to [facility] with acute shortness of breath and hypertensive urgency.  Found on admit to have complete opacification of left hemithorax.  Previous thoracentesis in September 02 2018 was transudative.  Underwent left thoracentesis on July 11 with 2300 ml of dark red fluid removed, found to be exudative with cytology negative for neoplasm.  Underwent left VATS on 7/13 with placement of chest tube found to have pleural pulsatile masses.  CT chest then performed which showed consolidation or mass along the pleural surface of the anterior lateral left upper lobe.  The case was discussed with cardiothoracic at [facility] with some question that this was acute consolidation verses rather than a mass and recommended further outpatient follow up after completion of antibiotic therapy.  Patient was continued on levofloxacin.    During his hospitalization,  patient with recurrent bouts of nausea and vomiting in which wife had reported that patient has been having 3 to 4 times a month over the last year.  CT abdomen obtained showed marked gastric distention with dilation of the 2nd and proximal 3rd portions of the duodenum possible narrowing at the level of the hiatus between the aorta and superior mesenteric artery consistent with SMA syndrome.  An nasal gastric tube was placed, pulled out by patient and since replaced.  No nasal jejunostomy tubes available there and has remained NPO. Additionally, patient with with SIRS and fever with worsening confusion vs delirum.  Vancomycin was added. Some recent bouts of hypotension improved after unclear amount of fluid boluses.  Given his chronic and acute issues, it was recommended he be transferred to a tertiary center for further evaluation and management.  PCCM accepted to ICU given recent hemodynamic instability.   Past Medical History  ESRD on TTS- iHD- LUE AVF, DM, tobacco abuse, diabetic gastroparesis, GERD, HTN, diastolic HF, multiple sclerosis, CVA w/residual left sided weakness, chronic left pleural effusion, hx cdiff, diabetic neuropathy, anemia, HLD, depression  Significant Hospital Events   7/11 Admitted to [facility] left thoracentesis  7/13 left VATS/ chest tube  7/19 left CT removed  7/23 transferred to [facility]  Consults:   Procedures:   Significant Diagnostic Tests:   Micro Data:  OSH micro see paperwork  7/23 BC x 2 >>  Antimicrobials:  levaquin >> 7/20 vancomycin >>  Interim history/subjective:  No complaints from patient except for sore throat  Objective   Blood pressure (!) 119/57, pulse 98, temperature 99.5 F (37.5 C), temperature source Oral, resp. rate 17, height 5\' 11"  (1.803 m), weight 57.7  kg, SpO2 98 %.        Intake/Output Summary (Last 24 hours) at 01/29/2019 0846 Last data filed at 01/29/2019 0700 Gross per 24 hour  Intake 2053.53 ml  Output 75 ml   Net 1978.53 ml   Filed Weights   01/28/19 0600 01/29/19 0500  Weight: 57.4 kg 57.7 kg    Examination: General:  Thin older appearing male in NAD HEENT: MM pink/moist- heavy film coating to tongue-wipes off, pupils 4/reactive, anicteric, R NGT clamped Neuro: Awake, confused, oriented to self and hospital, MAE- generalized weakness CV: rr ir, no murmur, +2 pulses PULM: even/non-labored, lungs bilaterally diminished GI: flat, ND/ NT, +bs Extremities: warm/dry, no edema, LUE AVF +b/t Skin: no rashes, sacral pressure ulcer  Resolved Hospital Problem list    Assessment & Plan:   Chronic Pleural Effusions s/p Left VATS, Concern for Pleural Mass vs Infectious Consolidation  -Pleural Fluid cytology with no evidence of neoplasia  Plan  -Continue Vancomycin/Levaquin >> Follow Culture Data > Plans to continue antibiotic course follow up with repeat imaging  -Currently on 3L [place], Titrate to Maintain Oxygen Saturation >92 -Pulmonary Hygiene   SBO, concern for SMA syndrome  CT without clear obstruction  BGT in place P:  - Appreciate surgical consult. Do not feel this is secondary to SMA syndrome. Recommended GI consult  - Will consult GI today  - NGT on suction. Would do intermittent suction. Could discontinue as it is only for symptom relief.  Follow cultures from OSH  Hx HTN with recent hypotension episodes Diastolic HF- currently not volume overloaded  Echo with hyperdynamic LVEF  P: - Continue to hold home antihypertensives  - Clinically looks very dehydrated and with hyperdynamic LVEF on echo would give him some fluid and try to keep even when he goes for HD.   ESRD on TTS P:  - HD per nephrology   Acute encephalopathy- unclear etiology of delirium vs other etiology/ ?sepsis P:  Monitor See above Pending MAR review  Chronic anemia P:  CBC now  DM P:  CBG q 4 SSI sensitive while NPO  Multiple sclerosis P:  Possibly on copaxone MWF- will need to clarify MAR   GERD P:  PPI   Best practice:  Diet: NPO Pain/Anxiety/Delirium protocol (if indicated): n/a VAP protocol (if indicated): n/a DVT prophylaxis: heparin SQ GI prophylaxis: PPI Glucose control: SSI Mobility: PT/ OT Code Status: full  Family Communication: Will call to update Disposition: Transfer to progressive   Labs   CBC: Recent Labs  Lab 01/28/19 0546 01/29/19 0309  WBC 15.5* 15.2*  NEUTROABS 12.6*  --   HGB 11.0* 8.2*  HCT 35.0* 25.6*  MCV 78.3* 77.1*  PLT 263 073   Basic Metabolic Panel: Recent Labs  Lab 01/28/19 0546 01/29/19 0309  NA 136 135  K 5.3* 3.1*  CL 85* 86*  CO2 28 28  GLUCOSE 132* 84  BUN 76* 84*  CREATININE 9.24* 10.08*  CALCIUM 9.0 9.0  MG 2.2 2.1  PHOS 8.8* 6.8*   GFR: Estimated Creatinine Clearance: 6.2 mL/min (A) (by C-G formula based on SCr of 10.08 mg/dL (H)). Recent Labs  Lab 01/28/19 0546 01/28/19 0948 01/29/19 0309  PROCALCITON 7.08  --   --   WBC 15.5*  --  15.2*  LATICACIDVEN  --  1.1  --    Liver Function Tests: Recent Labs  Lab 01/28/19 0546  AST 52*  ALT 20  ALKPHOS 68  BILITOT 1.5*  PROT 7.1  ALBUMIN 2.7*  No results for input(s): LIPASE, AMYLASE in the last 168 hours. No results for input(s): AMMONIA in the last 168 hours.  ABG No results found for: PHART, PCO2ART, PO2ART, HCO3, TCO2, ACIDBASEDEF, O2SAT   Coagulation Profile: Recent Labs  Lab 01/28/19 0948  INR 1.6*   Cardiac Enzymes: No results for input(s): CKTOTAL, CKMB, CKMBINDEX, TROPONINI in the last 168 hours.  HbA1C: No results found for: HGBA1C  CBG: Recent Labs  Lab 01/28/19 1638 01/28/19 2017 01/29/19 0033 01/29/19 0308 01/29/19 0804  GLUCAP 146* 162* 149* 89 125*   Review of Systems:   Limited given patient's confusion.  Currently only complaint is throat pain.   Past Medical History  He,  has a past medical history of Anemia, CHF (congestive heart failure) ([place]), CVA (cerebral vascular accident) ([place]), Diabetes mellitus  without complication ([place]), ESRD (end stage renal disease) on dialysis ([place]), Gastroparesis due to DM ([place]), Multiple sclerosis ([place]), Recurrent left pleural effusion, and Tobacco abuse.   Surgical History   unable  Social History    unable  Family History   His family history is not on file.   Allergies Allergies  Allergen Reactions  . Baclofen     confusion  . Cefepime     Confusion, hives   . Penicillins     Home Medications  Prior to Admission medications   Not on File        Ina Homes, MD  [facility] PGY3  Pager: [phone]

## 2019-01-29 NOTE — Progress Notes (Signed)
[place] Progress Note Patient Name: Cole Nelson DOB: May 16, 1956 MRN: [id number]   Date of Service  01/29/2019  HPI/Events of Note  K+ 3.1  eICU Interventions  KCL 10 meq iv x 1        Forestine Macho U Ovid Witman 01/29/2019, 4:44 AM

## 2019-01-29 NOTE — Progress Notes (Signed)
Nutrition Follow-up  RD working remotely.  DOCUMENTATION CODES:   Underweight, Severe malnutrition in context of chronic illness  INTERVENTION:   - Boost Breeze po TID, each supplement provides 250 kcal and 9 grams of protein  - Pro-stat 30 ml BID, each supplement provides 100 kcal and 15 grams of protein  NUTRITION DIAGNOSIS:   Severe Malnutrition related to chronic illness (ESRD on HD, CHF) as evidenced by severe muscle depletion, severe fat depletion.  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 PMH of ESRD on HD, DM, diabetic gastroparesis, GERD, HTN, CHF, MS, CVA with residual left-sided weakness, HLD, anemia.  7/24 - NG tube d/c, clear liquids  Per Surgery note, CT scan does not show any evidence of significant obstruction. NGT d/c and clear liquids started. RD will order oral nutrition supplements to aid pt in meeting nutritional needs while on clear liquids.  Spoke with CCM MD regarding Cortrak order. Per MD, pt does not need a Cortrak at this time.  Recommendation is for GI evaluation and possible EGD to rule out obstruction.  Pt to receive HD today.  Medications reviewed and include: SSI q 4 hours, Reglan 5 mg q 6 hours, Protonix IVF: D5 in NS @ 75 ml/hr  Labs reviewed: potassium 3.1, phosphorus 6.8, hemoglobin 8.2 CBG's: 89-162 x 24 hours  I/O's: +2.0 L since admit  Diet Order:    Diet Order            Diet clear liquid Room service appropriate? Yes; Fluid consistency: Thin  Diet effective now              EDUCATION NEEDS:   Not appropriate for education at this time  Skin:  Skin Assessment: Skin Integrity Issues: Stage II: right buttocks  Last BM:  no documented BM  Height:   Ht Readings from Last 1 Encounters:  01/28/19 5\' 11"  (1.803 m)    Weight:   Wt Readings from Last 1 Encounters:  01/29/19 57.7 kg    Ideal Body Weight:  78.2 kg  BMI:  Body mass index is 17.74 kg/m.  Estimated Nutritional Needs:   Kcal:  1800-2000  Protein:  80-95 grams  Fluid:  >/= 1.8 L    Gaynell Face, MS, RD, LDN Inpatient Clinical Dietitian Pager: [phone] Weekend/After Hours: [phone]

## 2019-01-29 NOTE — Progress Notes (Signed)
Patient ID: Cole Nelson, male   DOB: [birth year], 63 y.o.   MRN: [id number]       Subjective: Patient with no abdominal pain.  Minimal NGT output.  Objective: Vital signs in last 24 hours: Temp:  [98.2 F (36.8 C)-99.5 F (37.5 C)] 99.5 F (37.5 C) (07/24 0300) Pulse Rate:  [91-109] 98 (07/24 0700) Resp:  [13-34] 17 (07/24 0700) BP: (90-153)/(40-68) 119/57 (07/24 0700) SpO2:  [89 %-100 %] 98 % (07/24 0700) Weight:  [57.7 kg] 57.7 kg (07/24 0500) Last BM Date: (PTA)  Intake/Output from previous day: 07/23 0701 - 07/24 0700 In: 2053.5 [I.V.:1353.5; NG/GT:600; IV Piggyback:100] Out: 75 [Emesis/NG output:75] Intake/Output this shift: No intake/output data recorded.  PE: Abd: soft, NT, very skinny, ND, NGT with minimal 200cc of output since being placed on suction yesterday.  Lab Results:  Recent Labs    01/28/19 0546 01/29/19 0309  WBC 15.5* 15.2*  HGB 11.0* 8.2*  HCT 35.0* 25.6*  PLT 263 311   BMET Recent Labs    01/28/19 0546 01/29/19 0309  NA 136 135  K 5.3* 3.1*  CL 85* 86*  CO2 28 28  GLUCOSE 132* 84  BUN 76* 84*  CREATININE 9.24* 10.08*  CALCIUM 9.0 9.0   PT/INR Recent Labs    01/28/19 0948  LABPROT 18.4*  INR 1.6*   CMP     Component Value Date/Time   NA 135 01/29/2019 0309   K 3.1 (L) 01/29/2019 0309   CL 86 (L) 01/29/2019 0309   CO2 28 01/29/2019 0309   GLUCOSE 84 01/29/2019 0309   BUN 84 (H) 01/29/2019 0309   CREATININE 10.08 (H) 01/29/2019 0309   CALCIUM 9.0 01/29/2019 0309   PROT 7.1 01/28/2019 0546   ALBUMIN 2.7 (L) 01/28/2019 0546   AST 52 (H) 01/28/2019 0546   ALT 20 01/28/2019 0546   ALKPHOS 68 01/28/2019 0546   BILITOT 1.5 (H) 01/28/2019 0546   GFRNONAA 5 (L) 01/29/2019 0309   GFRAA 6 (L) 01/29/2019 0309   Lipase  No results found for: LIPASE     Studies/Results: Ct Abdomen Pelvis W Contrast  Result Date: 01/28/2019 CLINICAL DATA:  Abdominal pain, history of possible SMA syndrome on outside CT. EXAM: CT ABDOMEN  AND PELVIS WITH CONTRAST TECHNIQUE: Multidetector CT imaging of the abdomen and pelvis was performed using the standard protocol following bolus administration of intravenous contrast. CONTRAST:  155mL OMNIPAQUE IOHEXOL 300 MG/ML  SOLN COMPARISON:  Plain film from earlier in the same day. FINDINGS: Lower chest: Right lung base demonstrates small right pleural effusion with basilar atelectasis. There is a large left basilar hydropneumothorax similar to that seen on recent chest x-ray. By history this is of a chronic nature. Hepatobiliary: Vicarious excretion of contrast is noted within the gallbladder. The liver is within normal limits. Pancreas: Unremarkable. No pancreatic ductal dilatation or surrounding inflammatory changes. Spleen: Normal in size without focal abnormality. Adrenals/Urinary Tract: Adrenal glands are within normal limits. Kidneys demonstrates scattered cystic changes bilaterally without obstructive change. No definitive renal calculi are seen. The bladder is decompressed. Stomach/Bowel: Diverticular change of the colon is noted without evidence of diverticulitis. No obstructive or inflammatory changes are seen. The appendix is not well visualized. No inflammatory changes are identified to suggest appendicitis. The stomach is decompressed by a nasogastric catheter. The space between the SMA and aorta is mildly narrowed with some compression upon the third portion of the duodenum although no proximal dilatation is seen due to the gastric drainage catheter. The angle  of the SMA and aorta appears within normal limits. Vascular/Lymphatic: Aortic atherosclerosis. No enlarged abdominal or pelvic lymph nodes. Reproductive: Prostate is unremarkable. Other: No abdominal wall hernia or abnormality. No abdominopelvic ascites. Musculoskeletal: Degenerative changes of the lumbar spine IMPRESSION: Large left hydropneumothorax which by history is chronic in nature. Normal appearing angle between the aorta and  superior mesenteric artery although there is some mild compression upon the third portion of the duodenum on the axial images. The degree of obstruction is incompletely evaluated due to decompression by the nasogastric catheter. Comparison with the previous images would be helpful if they could be made available. Diverticulosis without diverticulitis. Small right-sided pleural effusion. Electronically Signed   By: Inez Catalina M.D.   On: 01/28/2019 21:04   Dg Chest Port 1 View  Result Date: 01/28/2019 CLINICAL DATA:  Pleural effusion and left basilar pneumothorax EXAM: PORTABLE CHEST 1 VIEW COMPARISON:  None available FINDINGS: Enteric tube tip at the stomach with side port at the GE junction. Gas collection at the left base. There is history of recent VATs with no comparison imaging available in this patient who is a recent transfer. Mild left-sided lung opacity apically. The right lung is clear. Normal heart size. Left axillary stenting. Mild left axillary soft tissue emphysema IMPRESSION: 1. Moderate gas collection at the left base in this patient with history of basal pneumothorax. 2. Nonspecific left pulmonary opacity. Electronically Signed   By: Monte Fantasia M.D.   On: 01/28/2019 08:05    Anti-infectives: Anti-infectives (From admission, onward)   Start     Dose/Rate Route Frequency Ordered Stop   01/28/19 0330  levofloxacin (LEVAQUIN) IVPB 500 mg     500 mg 100 mL/hr over 60 Minutes Intravenous Every 48 hours 01/28/19 0323     01/28/19 0323  vancomycin variable dose per unstable renal function (pharmacist dosing)      Does not apply See admin instructions 01/28/19 0323         Assessment/Plan MMP  N/V, ? SMA syndrome -CT scan does not show any evidence of significant obstruction.  NGT was in place at the time but had not been on suction all day so decompression seen was likely baseline. - no overt evidence for acute obstruction at this time.   -DC NGT and give clear liquids    FEN - CLD VTE - heparin ID - vanc/levaquin   LOS: 1 day    Henreitta Cea , [facility] 01/29/2019, 8:25 AM Pager: [phone]

## 2019-01-29 NOTE — Consult Note (Addendum)
[facility] Consult: 11:50 AM 01/29/2019  LOS: 1 day    Referring Provider: Dr Tarri Abernethy CCM resident.    Primary Care Physician:  Sammuel Bailiff.  Primary Gastroenterologist: Unassigned patient transferred from [facility].  General Surgeon, Dr Sherryl Manges, is "GI" there.   Wife is [name] Files cell phone [phone].  Home phone [phone]. Daughter Sunny Schlein phone [phone], she is a physical therapist.    Reason for Consultation: Nausea, vomiting.   HPI: Cole Nelson is a 63 y.o. male.  Hx CVA.  CHF.  Multiple sclerosis.    Multiple recurrences of hemorrhagic pleural effusion, transudate of effusion on thoracentesis 08/2018.  ESRD on dialysis TTS.  IDDM.  Diabetic neuropathy.  Diastolic heart failure Anemia.  C. difficile colitis.  GERD.   S/p Fundoplication  ~1017.  Ccolonoscopy and EGD ~ 06/2018 vs 07/2018, unremarkable per wife.   Hospitalized starting 7/11 through 7/23 at [facility] for hypoxia, shortness of breath, chronic pleural effusion.Thoracentesis with exudative effusion, cytology negative for cancer.  VATS, chest tube placement 7/13.  Postop CT chest revealed consolidation versus mass at the left upper lobe pleura.  After discussing case with specialist at [facility] it was felt this was probably acute consolidation rather than a mass. Hospitalization complicated by Sirs, fever, confusion/delirium, hypotension.  For at least a year patient has been having episodes of nausea and vomiting 3 or 4 times a month lasting 1/2 to all day, then resolves.  This got worse when he was in the hospital.  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    Due to the complexity of care and question of SMA syndrome, the patient was transferred to [facility] repeated on 7/23 revealing large, left hydropneumothorax.  Mild compression at the third duodenum incompletely evaluated due to decompression by NG tube.  Incidental diverticulosis and a small right pleural effusion Patient has been seen by both vascular and general surgery.  Vascular surgery deferred any interventions to general surgery.  Dr.Tsuei is not impressed that pt has SMA syndrome, suggests pt undergo EGD/Enteroscopy to r/o mucosal dz/obstruction.   Labs here revealed initial potassium of 5.3, 3.1 today. Abnormal BUN and creatinine as expected in ESRD patient. Microcytic anemia.  Hemoglobins 8.2, MCV 77.1.  Hgb 01/20/19: 10.4. PT/INR 18.4/1.6. T bili 1.5, alkaline phosphatase 68, AST/ALT 52/20. COVID 19 negative Echocardiogram with LVEF greater than 65%.  Additional GI symptoms include chronic constipation with bowel movements every 1 to 2 weeks.  Takes Amitiza infrequently because when he does, it causes loose stools and incontinence.  Rare minor bleeding per rectum after a bowel movement.  Appetite variable.  Sometimes it is good sometimes it is poor.  He is gone from a size 42 size 32 inch waist in the last year. GI meds at home include omeprazole 20 mg daily, ferrous sulfate 325 mg daily dicyclomine and Phenergan prn, Amitiza prn.  Receives Epogen at hemodialysis  center.    Past Medical History:  Diagnosis Date   Anemia    CHF (congestive heart failure) (HCC)    CVA (cerebral vascular accident) ([place])    Diabetes mellitus without complication ([place])    ESRD (end stage renal disease) on dialysis ([place])    Gastroparesis due to DM ([place])    Multiple sclerosis (HCC)    Recurrent left pleural effusion    Tobacco abuse     Past Surgical History:  Procedure Laterality Date   VIDEO ASSISTED THORACOSCOPY (VATS)/DECORTICATION       Prior to Admission medications   Not on File    Scheduled Meds:  Chlorhexidine Gluconate Cloth  6 each Topical Daily   Chlorhexidine Gluconate Cloth  6 each Topical Q0600   feeding supplement  1 Container Oral TID BM   feeding supplement (PRO-STAT SUGAR FREE 64)  30 mL Oral BID   heparin  5,000 Units Subcutaneous Q8H   insulin aspart  0-9 Units Subcutaneous Q4H   mouth rinse  15 mL Mouth Rinse BID   metoCLOPramide (REGLAN) injection  5 mg Intravenous Q6H   nystatin  5 mL Oral QID   pantoprazole (PROTONIX) IV  40 mg Intravenous Q24H   vancomycin variable dose per unstable renal function (pharmacist dosing)   Does not apply See admin instructions   Infusions:  dextrose 5 % and 0.9% NaCl 75 mL/hr at 01/29/19 0957   levofloxacin (LEVAQUIN) IV Stopped (01/28/19 0517)   PRN Meds: acetaminophen, ondansetron (ZOFRAN) IV, promethazine   Allergies as of 01/27/2019   (Not on File)    History reviewed. No pertinent family history.  Social History   Socioeconomic History   Marital status: Married    Spouse name: Not on file   Number of children: Not on file   Years of education: Not on file   Highest education level: Not on file  Occupational History   Not on file  Social Needs   Financial resource strain: Not on file   Food insecurity    Worry: Not on file    Inability: Not on file   Transportation needs    Medical: Not on file    Non-medical: Not on file  Tobacco Use   Smoking status: Not on file  Substance and Sexual Activity   Alcohol use: Not on file   Drug use: Not on file   Sexual activity: Not on file  Lifestyle   Physical activity    Days per week: Not on file    Minutes per session: Not on file   Stress: Not on file  Relationships   Social connections    Talks on phone: Not on file    Gets together: Not on file    Attends religious service: Not on file    Active member of club or organization: Not on file    Attends  meetings of clubs or organizations: Not on file    Relationship status: Not on file   Intimate partner violence    Fear of current or ex partner: Not on file    Emotionally abused: Not on file    Physically abused: Not on file    Forced sexual activity: Not on file  Other Topics Concern   Not on file  Social History Narrative   Not on file    REVIEW OF SYSTEMS: Constitutional: Weakness ENT:  No nose bleeds Pulm: Per HPI. CV:  No palpitations, no LE edema.  No chest pain GU:  No hematuria,  no frequency.  Oliguric but still makes urine. GI: See HPI. Heme: No bleeding from the skin, mouth, nose. Transfusions: No previous transfusions. Neuro:  No headaches, no peripheral tingling or numbness.  Ambulates with walker and wheelchair at home Derm:  No itching, no rash or sores.  Endocrine:  No sweats or chills.  No polyuria or dysuria Immunization: Not queried. Travel:  None beyond local counties in last few months.    PHYSICAL EXAM: Vital signs in last 24 hours: Vitals:   01/29/19 0800 01/29/19 0900  BP: (!) 154/58 (!) 115/56  Pulse: (!) 102 99  Resp: 15 17  Temp:    SpO2: 96% 96%   Wt Readings from Last 3 Encounters:  01/29/19 57.7 kg    General: Frail, chronically ill looking AAM, resting comfortably in bed. Head: No signs of head trauma.  No facial asymmetry. Eyes: No scleral icterus, no conjunctival pallor.  Ears: No obvious hearing deficit. Nose: No congestion, some dried crusty discharge at the opening of the nares. Mouth: No teeth.  Oral mucosa pink, moist, clear.  Tongue midline. Neck: No JVD, no masses, no thyromegaly. Lungs: Coarse breath sounds bilaterally.  No labored breathing. Heart: RRR.  Normal S1, S2.  2/6 systolic murmur Abdomen: Soft, not tender or distended..   Rectal: Not performed. Musc/Skeltl: Muscular wasting.  No contracture deformities. Extremities: No CCE.  AV fistula in the left upper extremity. Neurologic: Left-sided paresis.  No  tremors.  Alert.  Poor historian.  Oriented to year. Skin: No rash, no sores.  Skin warm and dry   Intake/Output from previous day: 07/23 0701 - 07/24 0700 In: 2053.5 [I.V.:1353.5; NG/GT:600; IV Piggyback:100] Out: 75 [Emesis/NG output:75] Intake/Output this shift: Total I/O In: 175 [I.V.:75.1; IV Piggyback:100] Out: 125 [Emesis/NG output:125]  LAB RESULTS: Recent Labs    01/28/19 0546 01/29/19 0309  WBC 15.5* 15.2*  HGB 11.0* 8.2*  HCT 35.0* 25.6*  PLT 263 311   BMET Lab Results  Component Value Date   NA 135 01/29/2019   NA 136 01/28/2019   K 3.1 (L) 01/29/2019   K 5.3 (H) 01/28/2019   CL 86 (L) 01/29/2019   CL 85 (L) 01/28/2019   CO2 28 01/29/2019   CO2 28 01/28/2019   GLUCOSE 84 01/29/2019   GLUCOSE 132 (H) 01/28/2019   BUN 84 (H) 01/29/2019   BUN 76 (H) 01/28/2019   CREATININE 10.08 (H) 01/29/2019   CREATININE 9.24 (H) 01/28/2019   CALCIUM 9.0 01/29/2019   CALCIUM 9.0 01/28/2019   LFT Recent Labs    01/28/19 0546  PROT 7.1  ALBUMIN 2.7*  AST 52*  ALT 20  ALKPHOS 68  BILITOT 1.5*   PT/INR Lab Results  Component Value Date   INR 1.6 (H) 01/28/2019    RADIOLOGY STUDIES: Ct Abdomen Pelvis W Contrast  Result Date: 01/28/2019 CLINICAL DATA:  Abdominal pain, history of possible SMA syndrome on outside CT. EXAM: CT ABDOMEN AND PELVIS WITH CONTRAST TECHNIQUE: Multidetector CT imaging of the abdomen and pelvis was performed using the standard protocol following bolus administration of intravenous contrast. CONTRAST:  144mL OMNIPAQUE IOHEXOL 300 MG/ML  SOLN COMPARISON:  Plain film from earlier in the same day. FINDINGS: Lower chest: Right lung base demonstrates small right pleural effusion with basilar atelectasis. There is a large left basilar hydropneumothorax similar to that seen on recent chest x-ray. By history this is of a chronic nature. Hepatobiliary: Vicarious excretion of contrast is noted within the gallbladder. The liver is within normal limits.  Pancreas: Unremarkable. No pancreatic ductal dilatation or surrounding inflammatory changes. Spleen: Normal in size without focal abnormality. Adrenals/Urinary Tract: Adrenal glands are within normal limits. Kidneys demonstrates scattered cystic changes bilaterally without obstructive change. No definitive renal calculi are seen. The bladder is decompressed. Stomach/Bowel: Diverticular change of the colon is noted without evidence of diverticulitis. No obstructive or inflammatory changes are seen. The appendix is not well visualized. No inflammatory changes are identified to suggest appendicitis. The stomach is decompressed by a nasogastric catheter. The space between the SMA and aorta is mildly narrowed with some compression upon the third portion of the duodenum although no proximal dilatation is seen due to the gastric drainage catheter. The angle of the SMA and aorta appears within normal limits. Vascular/Lymphatic: Aortic atherosclerosis. No enlarged abdominal or pelvic lymph nodes. Reproductive: Prostate is unremarkable. Other: No abdominal wall hernia or abnormality. No abdominopelvic ascites. Musculoskeletal: Degenerative changes of the lumbar spine IMPRESSION: Large left hydropneumothorax which by history is chronic in nature. Normal appearing angle between the aorta and superior mesenteric artery although there is some mild compression upon the third portion of the duodenum on the axial images. The degree of obstruction is incompletely evaluated due to decompression by the nasogastric catheter. Comparison with the previous images would be helpful if they could be made available. Diverticulosis without diverticulitis. Small right-sided pleural effusion. Electronically Signed   By: Inez Catalina M.D.   On: 01/28/2019 21:04   Dg Chest Port 1 View  Result Date: 01/28/2019 CLINICAL DATA:  Pleural effusion and left basilar pneumothorax EXAM: PORTABLE CHEST 1 VIEW COMPARISON:  None available FINDINGS: Enteric  tube tip at the stomach with side port at the GE junction. Gas collection at the left base. There is history of recent VATs with no comparison imaging available in this patient who is a recent transfer. Mild left-sided lung opacity apically. The right lung is clear. Normal heart size. Left axillary stenting. Mild left axillary soft tissue emphysema IMPRESSION: 1. Moderate gas collection at the left base in this patient with history of basal pneumothorax. 2. Nonspecific left pulmonary opacity. Electronically Signed   By: Monte Fantasia M.D.   On: 01/28/2019 08:05    IMPRESSION:   *   Acute worsening of nausea, vomiting since hospitalization with left pleural effusion. 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   Carries diagnosis of diabetic gastroparesis and GERD.  Fundoplication by Dr Juliann Mule in [place] ~ 2017/18  *     Chronic constipation.  Has had colonoscopy along with EGD within the last 9 months  *     Anemia of chronic disease.  On Epogen at hemodialysis.  *    S/p 01/18/2019 VATS to address left pleural effusion  *     ESRD.  Undergoes dialysis TTS though headed to HD now on Friday.    *      Microcytic anemia.  *   Hypokalemia.  *       Protein calorie malnutrition.  RD evaluation today recommends supplements with boost TID and pro-stat twice daily.  Current diet is clear liquids.  *      DM2.     *    Multiple sclerosis.   PLAN:     *   Enteroscopy tomorrow?   Dr Hilarie Fredrickson to decide.  Will place orders in depot.    *   Continue the current Reglan 5 mg IV q  6 hours, Protonix 40 mg IV/24 hours   Azucena Freed  01/29/2019, 11:50 AM Phone [phone]

## 2019-01-29 NOTE — Progress Notes (Signed)
Admit: 01/28/2019 LOS: 1  23M ESRD, CHF, hx/o CVA L sided paresis, DM, ?dementia, admit after VATS for chronic L pleural effusion ? Pulsating mass and SMA syndrome  Subjective:  . CT A/P, SMA less likley; needs EGD . No interval complaints  . Gen surg approves startign PO  07/23 0701 - 07/24 0700 In: 2053.5 [I.V.:1353.5; NG/GT:600; IV Piggyback:100] Out: 75 [Emesis/NG output:75]  Filed Weights   01/28/19 0600 01/29/19 0500  Weight: 57.4 kg 57.7 kg    Scheduled Meds: . Chlorhexidine Gluconate Cloth  6 each Topical Daily  . Chlorhexidine Gluconate Cloth  6 each Topical Q0600  . heparin  5,000 Units Subcutaneous Q8H  . insulin aspart  0-9 Units Subcutaneous Q4H  . mouth rinse  15 mL Mouth Rinse BID  . metoCLOPramide (REGLAN) injection  5 mg Intravenous Q6H  . nystatin  5 mL Oral QID  . pantoprazole (PROTONIX) IV  40 mg Intravenous Q24H  . vancomycin variable dose per unstable renal function (pharmacist dosing)   Does not apply See admin instructions   Continuous Infusions: . dextrose 5 % and 0.9% NaCl Stopped (01/29/19 0800)  . levofloxacin (LEVAQUIN) IV Stopped (01/28/19 0517)   PRN Meds:.acetaminophen, ondansetron (ZOFRAN) IV, promethazine  Current Labs: reviewed    Physical Exam:  Blood pressure (!) 115/56, pulse 99, temperature 99.5 F (37.5 C), temperature source Oral, resp. rate 17, height 5\' 11"  (1.803 m), weight 57.7 kg, SpO2 96 %. Chronically ill appearing RRR nl s1s2 Coarse bs b/l No LEE LUE AVF +B/T EOMI L sided paresis  Dialysis Prescription: [facility], Optiflux 180, EDW 77kg, 3hr 60min, BFR 450/DFR 800, 2K/2.5Ca, Epogen 10,000 units TIW, Hectorol 52mcg TIW, ONS, LUA AVF 15G, Standard Sodium, Heparin 2000units load and 700units/hr  A 1. ESRD THS [facility] LUE AVF 2. Anemia, Hb down to 8 from 11? Repeat with HD 3. CKD BMD: follow as he resumes some PO 4. PCM 5. ?SMA syndrome, felt not liekly 6. Chronic pleural  effusion  P . HD today: use 4K for now . Rpt Hb with HD . Medication Issues; o Preferred narcotic agents for pain control are hydromorphone, fentanyl, and methadone. Morphine should not be used.  o Baclofen should be avoided o Avoid oral sodium phosphate and magnesium citrate based laxatives / bowel preps    Pearson Grippe MD 01/29/2019, 9:53 AM  Recent Labs  Lab 01/28/19 0546 01/29/19 0309  NA 136 135  K 5.3* 3.1*  CL 85* 86*  CO2 28 28  GLUCOSE 132* 84  BUN 76* 84*  CREATININE 9.24* 10.08*  CALCIUM 9.0 9.0  PHOS 8.8* 6.8*   Recent Labs  Lab 01/28/19 0546 01/29/19 0309  WBC 15.5* 15.2*  NEUTROABS 12.6*  --   HGB 11.0* 8.2*  HCT 35.0* 25.6*  MCV 78.3* 77.1*  PLT 263 311

## 2019-01-30 ENCOUNTER — Encounter (HOSPITAL_COMMUNITY)
Admission: AD | Disposition: A | Discharge status: Skilled Nursing Facility | Payer: Self-pay | Source: Other Acute Inpatient Hospital | Attending: Internal Medicine

## 2019-01-30 ENCOUNTER — Inpatient Hospital Stay (HOSPITAL_COMMUNITY): Payer: Medicare PPO | Admitting: Certified Registered Nurse Anesthetist

## 2019-01-30 ENCOUNTER — Encounter (HOSPITAL_COMMUNITY): Payer: Self-pay | Admitting: *Deleted

## 2019-01-30 DIAGNOSIS — E43 Unspecified severe protein-calorie malnutrition: Secondary | ICD-10-CM

## 2019-01-30 DIAGNOSIS — K56609 Unspecified intestinal obstruction, unspecified as to partial versus complete obstruction: Secondary | ICD-10-CM

## 2019-01-30 LAB — CBC WITH DIFFERENTIAL/PLATELET
Abs Immature Granulocytes: 0.1 10*3/uL — ABNORMAL HIGH (ref 0.00–0.07)
Basophils Absolute: 0 10*3/uL (ref 0.0–0.1)
Basophils Relative: 0 %
Eosinophils Absolute: 0.8 10*3/uL — ABNORMAL HIGH (ref 0.0–0.5)
Eosinophils Relative: 7 %
HCT: 25.1 % — ABNORMAL LOW (ref 39.0–52.0)
Hemoglobin: 8 g/dL — ABNORMAL LOW (ref 13.0–17.0)
Immature Granulocytes: 1 %
Lymphocytes Relative: 6 %
Lymphs Abs: 0.6 10*3/uL — ABNORMAL LOW (ref 0.7–4.0)
MCH: 24.8 pg — ABNORMAL LOW (ref 26.0–34.0)
MCHC: 31.9 g/dL (ref 30.0–36.0)
MCV: 77.7 fL — ABNORMAL LOW (ref 80.0–100.0)
Monocytes Absolute: 1.4 10*3/uL — ABNORMAL HIGH (ref 0.1–1.0)
Monocytes Relative: 13 %
Neutro Abs: 8.1 10*3/uL — ABNORMAL HIGH (ref 1.7–7.7)
Neutrophils Relative %: 73 %
Platelets: ADEQUATE 10*3/uL (ref 150–400)
RBC: 3.23 MIL/uL — ABNORMAL LOW (ref 4.22–5.81)
RDW: 13.3 % (ref 11.5–15.5)
WBC: 11 10*3/uL — ABNORMAL HIGH (ref 4.0–10.5)
nRBC: 0 % (ref 0.0–0.2)

## 2019-01-30 LAB — GLUCOSE, CAPILLARY
Glucose-Capillary: 116 mg/dL — ABNORMAL HIGH (ref 70–99)
Glucose-Capillary: 116 mg/dL — ABNORMAL HIGH (ref 70–99)
Glucose-Capillary: 138 mg/dL — ABNORMAL HIGH (ref 70–99)
Glucose-Capillary: 149 mg/dL — ABNORMAL HIGH (ref 70–99)
Glucose-Capillary: 170 mg/dL — ABNORMAL HIGH (ref 70–99)

## 2019-01-30 LAB — HEPATITIS B CORE ANTIBODY, TOTAL: Hep B Core Total Ab: NEGATIVE

## 2019-01-30 LAB — HEPATITIS B SURFACE ANTIBODY,QUALITATIVE: Hep B S Ab: REACTIVE

## 2019-01-30 LAB — HEPATITIS B SURFACE ANTIGEN: Hepatitis B Surface Ag: NEGATIVE

## 2019-01-30 SURGERY — CANCELLED PROCEDURE

## 2019-01-30 MED ORDER — MONTELUKAST SODIUM 10 MG PO TABS
10.0000 mg | ORAL_TABLET | Freq: Every day | ORAL | Status: DC
  Administered 2019-01-30 – 2019-02-17 (×19): 10 mg via ORAL
  Filled 2019-01-30 (×19): qty 1

## 2019-01-30 MED ORDER — AMLODIPINE BESYLATE 10 MG PO TABS: 10.0000 mg | ORAL_TABLET | Freq: Every day | ORAL | Status: DC

## 2019-01-30 MED ORDER — ASPIRIN 81 MG PO CHEW
81.0000 mg | CHEWABLE_TABLET | Freq: Every day | ORAL | Status: DC
  Administered 2019-01-30 – 2019-02-17 (×19): 81 mg via ORAL
  Filled 2019-01-30 (×19): qty 1

## 2019-01-30 MED ORDER — CARVEDILOL 25 MG PO TABS
25.0000 mg | ORAL_TABLET | Freq: Two times a day (BID) | ORAL | Status: DC
  Administered 2019-01-30 – 2019-02-17 (×35): 25 mg via ORAL
  Filled 2019-01-30 (×35): qty 1

## 2019-01-30 MED ORDER — FLUOXETINE HCL 20 MG PO CAPS
20.0000 mg | ORAL_CAPSULE | Freq: Every day | ORAL | Status: DC
  Administered 2019-01-30 – 2019-02-17 (×19): 20 mg via ORAL
  Filled 2019-01-30 (×19): qty 1

## 2019-01-30 MED ORDER — FLUTICASONE PROPIONATE 50 MCG/ACT NA SUSP
1.0000 | NASAL | Status: DC | PRN
  Filled 2019-01-30: qty 16

## 2019-01-30 MED ORDER — HYDRALAZINE HCL 20 MG/ML IJ SOLN: 10.0000 mg | Freq: Four times a day (QID) | INTRAMUSCULAR | Status: DC | PRN

## 2019-01-30 MED ORDER — BUDESONIDE 0.25 MG/2ML IN SUSP
0.2500 mg | Freq: Two times a day (BID) | RESPIRATORY_TRACT | Status: DC
  Administered 2019-01-30 – 2019-02-17 (×32): 0.25 mg via RESPIRATORY_TRACT
  Filled 2019-01-30 (×32): qty 2

## 2019-01-30 MED ORDER — AMLODIPINE BESYLATE 5 MG PO TABS
5.0000 mg | ORAL_TABLET | Freq: Two times a day (BID) | ORAL | Status: DC
  Administered 2019-01-30 – 2019-02-17 (×34): 5 mg via ORAL
  Filled 2019-01-30 (×34): qty 1

## 2019-01-30 MED ORDER — RENA-VITE PO TABS
1.0000 | ORAL_TABLET | Freq: Every day | ORAL | Status: DC
  Administered 2019-01-30 – 2019-02-16 (×18): 1 via ORAL
  Filled 2019-01-30 (×18): qty 1

## 2019-01-30 MED ORDER — FERROUS SULFATE 325 (65 FE) MG PO TABS
325.0000 mg | ORAL_TABLET | Freq: Every day | ORAL | Status: DC
  Administered 2019-01-30 – 2019-02-17 (×19): 325 mg via ORAL
  Filled 2019-01-30 (×19): qty 1

## 2019-01-30 MED ORDER — LABETALOL HCL 5 MG/ML IV SOLN
10.0000 mg | INTRAVENOUS | Status: DC | PRN
  Administered 2019-01-30: 10 mg via INTRAVENOUS
  Filled 2019-01-30: qty 4

## 2019-01-30 NOTE — Progress Notes (Signed)
Admit: 01/28/2019 LOS: 2  26M ESRD, CHF, hx/o CVA L sided paresis, DM, ?dementia, admit after VATS for chronic L pleural effusion ? Pulsating mass and SMA syndrome  Subjective:  . HD yesterday, no UF, tolerated well . No interval complaints  . EGD canceled today, acute increase in blood pressure . Tolerating p.o.  07/24 0701 - 07/25 0700 In: 1920.4 [I.V.:820.8; IV Piggyback:1099.5] Out: 125 [Emesis/NG output:125]  Filed Weights   01/29/19 1715 01/29/19 1955 01/30/19 0749  Weight: 57.4 kg 62.2 kg 62.2 kg    Scheduled Meds: . Chlorhexidine Gluconate Cloth  6 each Topical Daily  . Chlorhexidine Gluconate Cloth  6 each Topical Q0600  . feeding supplement  1 Container Oral TID BM  . feeding supplement (PRO-STAT SUGAR FREE 64)  30 mL Oral BID  . heparin  5,000 Units Subcutaneous Q8H  . insulin aspart  0-9 Units Subcutaneous Q4H  . mouth rinse  15 mL Mouth Rinse BID  . metoCLOPramide (REGLAN) injection  5 mg Intravenous Q6H  . nystatin  5 mL Oral QID  . pantoprazole (PROTONIX) IV  40 mg Intravenous Q24H  . vancomycin variable dose per unstable renal function (pharmacist dosing)   Does not apply See admin instructions   Continuous Infusions: . levofloxacin (LEVAQUIN) IV 500 mg (01/30/19 0412)   PRN Meds:.acetaminophen, labetalol, ondansetron (ZOFRAN) IV, promethazine  Current Labs: reviewed    Physical Exam:  Blood pressure (!) 190/88, pulse (!) 107, temperature 98.7 F (37.1 C), temperature source Oral, resp. rate 20, height 5\' 11"  (1.803 m), weight 62.2 kg, SpO2 100 %. Chronically ill appearing RRR nl s1s2 Coarse bs b/l No LEE LUE AVF +B/T EOMI L sided paresis  Dialysis Prescription: [facility], Optiflux 180, EDW ?77kg, 3hr 37min, BFR 450/DFR 800, 2K/2.5Ca, Epogen 10,000 units TIW, Hectorol 89mcg TIW, ONS, LUA AVF 15G, Standard Sodium, Heparin 2000units load and 700units/hr  A 1. ESRD THS [facility] LUE AVF 2. Anemia, Hb down to 8.0 3. CKD BMD:  follow as he resumes some PO 4. PCM 5. ?SMA syndrome, felt not liekly 6. Chronic pleural effusion  P . HD today, 3h to get back on schedule: use 4K for now, 1-2L UF . See how BP does with UF . Medication Issues; o Preferred narcotic agents for pain control are hydromorphone, fentanyl, and methadone. Morphine should not be used.  o Baclofen should be avoided o Avoid oral sodium phosphate and magnesium citrate based laxatives / bowel preps    Pearson Grippe MD 01/30/2019, 11:32 AM  Recent Labs  Lab 01/28/19 0546 01/29/19 0309 01/30/19 0511  NA 136 135 133*  K 5.3* 3.1* 3.4*  CL 85* 86* 94*  CO2 28 28 24   GLUCOSE 132* 84 120*  BUN 76* 84* 38*  CREATININE 9.24* 10.08* 5.93*  CALCIUM 9.0 9.0 8.5*  PHOS 8.8* 6.8*  --    Recent Labs  Lab 01/28/19 0546 01/29/19 0309 01/30/19 0511  WBC 15.5* 15.2* 11.0*  NEUTROABS 12.6*  --  8.1*  HGB 11.0* 8.2* 8.0*  HCT 35.0* 25.6* 25.1*  MCV 78.3* 77.1* 77.7*  PLT 263 311 PLATELET CLUMPS NOTED ON SMEAR, COUNT APPEARS ADEQUATE

## 2019-01-30 NOTE — Interval H&P Note (Signed)
History and Physical Interval Note: As discussed yesterday, the plan this morning is for small bowel enteroscopy to eval distal duodenum to rule out visible obstruction.  Also to eval n/v. The nature of the procedure, as well as the risks, benefits, and alternatives were carefully and thoroughly reviewed with the patient. Ample time for discussion and questions allowed. The patient understood, was satisfied, and agreed to proceed.   CBC Latest Ref Rng & Units 01/30/2019 01/29/2019 01/28/2019  WBC 4.0 - 10.5 K/uL 11.0(H) 15.2(H) 15.5(H)  Hemoglobin 13.0 - 17.0 g/dL 8.0(L) 8.2(L) 11.0(L)  Hematocrit 39.0 - 52.0 % 25.1(L) 25.6(L) 35.0(L)  Platelets 150 - 400 K/uL PLATELET CLUMPS NOTED ON SMEAR, COUNT APPEARS ADEQUATE 311 263   Lab Results  Component Value Date   INR 1.6 (H) 01/28/2019      01/30/2019 8:10 AM  Cole Nelson  has presented today for surgery, with the diagnosis of ? duodenal obstruction, SMA syndrome??.  The various methods of treatment have been discussed with the patient and family. After consideration of risks, benefits and other options for treatment, the patient has consented to  Procedure(s): ENTEROSCOPY (N/A) as a surgical intervention.  The patient's history has been reviewed, patient examined, no change in status, stable for surgery.  I have reviewed the patient's chart and labs.  Questions were answered to the patient's satisfaction.     Lajuan Lines Myra Weng

## 2019-01-30 NOTE — Anesthesia Preprocedure Evaluation (Deleted)
Anesthesia Evaluation    Airway        Dental   Pulmonary           Cardiovascular      Neuro/Psych CVA    GI/Hepatic   Endo/Other  diabetes  Renal/GU ESRF and DialysisRenal disease     Musculoskeletal   Abdominal   Peds  Hematology   Anesthesia Other Findings   1. The left ventricle has hyperdynamic systolic function, with an ejection fraction of >65%. The cavity size was normal. There is mild concentric left ventricular hypertrophy. Left ventricular diastolic Doppler parameters are consistent with impaired  relaxation. No evidence of left ventricular regional wall motion abnormalities.  2. The right ventricle has normal systolic function. The cavity was normal. There is no increase in right ventricular wall thickness. Right ventricular systolic pressure could not be assessed.  3. The tricuspid valve is grossly normal.  4. The aorta is normal in size and structure.  Reproductive/Obstetrics                             Anesthesia Physical Anesthesia Plan  ASA: IV  Anesthesia Plan: MAC   Post-op Pain Management:    Induction: Intravenous  PONV Risk Score and Plan: 1 and Treatment may vary due to age or medical condition and Propofol infusion  Airway Management Planned: Nasal Cannula  Additional Equipment: None  Intra-op Plan:   Post-operative Plan:   Informed Consent: I have reviewed the patients History and Physical, chart, labs and discussed the procedure including the risks, benefits and alternatives for the proposed anesthesia with the patient or authorized representative who has indicated his/her understanding and acceptance.     Dental advisory given  Plan Discussed with: CRNA and Surgeon  Anesthesia Plan Comments: (BP elevated pre procedurally with poor iv access, evaluating urgency of procedure and BP to determine with case proceeds. )        Anesthesia Quick  Evaluation

## 2019-01-30 NOTE — Progress Notes (Signed)
Upon entering room to do shift change report.Patient sitting up in bed eating [place] sandwich.This nurse asked day shift RN if diet order had been changed from clear liquids.Day shift RN stated,"No.The dialysis nurse gave patient sandwich." This nurse explained to patient that as of right now he could not have solid foods and that he needs to have further testing done before increasing diet order.Patient verbalized understanding and the rest of sandwich thrown away.

## 2019-01-30 NOTE — H&P (View-Only) (Signed)
Plan was for enteroscopy as noted this morning, however while waiting for procedure and evaluation by anesthesia, it was noted that he has become progressively more hypertensive.  Blood pressure currently 352 systolic. Dr. Ermalene Postin, with anesthesia, does not feel the procedure is safe to proceed with, which is understandable given hypertension. I discussed this with his hospital physician, Dr. Horris Latino.  She will evaluate and treat hypertension and tachycardia. Once blood pressures are more controlled we can proceed with enteroscopy which may be tomorrow or even Monday depending on response to hypertensive management.

## 2019-01-30 NOTE — Progress Notes (Signed)
PROGRESS NOTE  Cole Nelson GGY:[id number] DOB: 1955/07/14 DOA: 01/28/2019 PCP: Patient, No Pcp Per  HPI/Recap of past 24 hours: HPI from Critical care team 63 year old male with history of ESRD on TTS- iHD via LUE AVF, DM, tobacco abuse, diabetic gastroparesis, GERD, HTN, diastolic HF, multiple sclerosis, CVA w/residual left sided weakness, chronic left pleural effusion, hx cdiff, diabetic neuropathy, anemia, HLD, and depression, was admitted on 7/11 to [facility] with acute shortness of breath and hypertensive urgency.  Found on admit to have complete opacification of left hemithorax.  Previous thoracentesis in September 02 2018 was transudative.  Underwent left thoracentesis on 7/11 with 2300 ml of dark red fluid removed, found to be exudative with cytology negative for neoplasm. Underwent left VATS on 7/13 with placement of chest tube found to have pleural pulsatile masses. CT chest then performed which showed consolidation or mass along the pleural surface of the anterior lateral left upper lobe. The case was discussed with cardiothoracic at [facility] with some question that this was acute consolidation rather than a mass and recommended further outpatient follow up after completion of antibiotic therapy. Patient was continued on levofloxacin.    During his hospitalization, patient with recurrent bouts of nausea and vomiting in which wife had reported that patient has been having 3 to 4 times a month over the last year. CT abdomen obtained showed marked gastric distention with dilation of the 2nd and proximal 3rd portions of the duodenum possible narrowing at the level of the hiatus between the aorta and superior mesenteric artery consistent with SMA syndrome.  A nasal gastric tube was placed, and stomach eventually decompressed. A CT scan here 7/23 now shows that the stomach is decompressed. 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 GI consulted, and recommended an enteroscopy. PCCM consulted [facility], assumed care on 01/30/2019    Today, patient denies any new complaints.  Improved mentation.  Patient was supposed to have enteroscopy by GI today, but due to hypertensive crisis procedure was canceled.  Consulted pharmacy to do a med rec for patient, so asked to resume home meds.  Assessment/Plan: Active Problems:   Acute encephalopathy   Pressure injury of skin   Pleural effusion   End-stage renal disease on hemodialysis (HCC)   Protein-calorie malnutrition, severe  Acute on chronic pleural effusions s/p left VATS, concern for pleural mass vs infectious consolidation Afebrile, with resolving leukocytosis, currently off oxygen BC x2 NGTD Pleural Fluid cytology with no evidence of neoplasia  Continue renally dosed vancomycin, Levaquin Pulmonary Hygiene  Plan is to likely follow-up with cardiothoracic surgeons at [facility] for further management once stable  SBO, concern for SMA syndrome  Denies any nausea/vomiting/abdominal pain, tolerating full liquid diet CT showed no clear evidence of SMA syndrome General surgery on board, do not feel its SMA syndrome GI on board, plan for enteroscopy for further evaluation, once blood pressure is more controlled, likely 0/35/0093  HTN/Diastolic HF BP currently uncontrolled Continue appears euvolemic Echo with hyperdynamic LVEF  IV labetalol as needed Resume home antihypertensives, once med rec is completed  ESRD on TTS HD per nephrology   Anemia of chronic kidney disease No signs of obvious bleeding Daily CBC  Diabetes mellitus type 2 SSI, Accu-Cheks, hypoglycemic protocol Hold home regimen for now  Gastroparesis Continue metoclopramide  Multiple sclerosis Stable  GERD Continue PPI        Malnutrition Type:  Nutrition Problem: Severe Malnutrition Etiology: chronic illness(ESRD on HD,  CHF)   Malnutrition Characteristics:  Signs/Symptoms: severe muscle depletion, severe fat depletion   Nutrition Interventions:  Interventions: Boost Breeze, Prostat    Estimated body mass index is 19.13 kg/m as calculated from the following:   Height as of this encounter: 5\' 11"  (1.803 m).   Weight as of this encounter: 62.2 kg.     Code Status: Full  Family Communication: Discussed with patient  Disposition Plan: To be determined   Consultants:  PCCM  General surgery  GI  Nephrology  Procedures:  None  Antimicrobials:  Vancomycin  Levaquin  DVT prophylaxis: Heparin   Objective: Vitals:   01/29/19 1955 01/30/19 0410 01/30/19 0749 01/30/19 0930  BP: (!) 149/86 (!) 186/68 (!) 215/75 (!) 190/88  Pulse: (!) 54 (!) 104 (!) 109 (!) 107  Resp: 19 20 20 20   Temp: 98.2 F (36.8 C) 98.4 F (36.9 C) 98.4 F (36.9 C) 98.7 F (37.1 C)  TempSrc: Oral Oral Oral Oral  SpO2: 100% 96% 98% 100%  Weight: 62.2 kg  62.2 kg   Height:   5\' 11"  (1.803 m)     Intake/Output Summary (Last 24 hours) at 01/30/2019 1045 Last data filed at 01/30/2019 0900 Gross per 24 hour  Intake 1745.31 ml  Output 0 ml  Net 1745.31 ml   Filed Weights   01/29/19 1715 01/29/19 1955 01/30/19 0749  Weight: 57.4 kg 62.2 kg 62.2 kg    Exam:  General: NAD, cachectic, chronically ill-appearing  Cardiovascular: S1, S2 present  Respiratory: CTAB  Abdomen: Soft, nontender, nondistended, bowel sounds present  Musculoskeletal: No bilateral pedal edema noted  Skin: Normal  Psychiatry: Normal mood   Data Reviewed: CBC: Recent Labs  Lab 01/28/19 0546 01/29/19 0309 01/30/19 0511  WBC 15.5* 15.2* 11.0*  NEUTROABS 12.6*  --  8.1*  HGB 11.0* 8.2* 8.0*  HCT 35.0* 25.6* 25.1*  MCV 78.3* 77.1* 77.7*  PLT 263 311 PLATELET CLUMPS NOTED ON SMEAR, COUNT APPEARS ADEQUATE   Basic Metabolic Panel: Recent Labs  Lab 01/28/19 0546 01/29/19 0309 01/30/19 0511  NA 136 135 133*  K  5.3* 3.1* 3.4*  CL 85* 86* 94*  CO2 28 28 24   GLUCOSE 132* 84 120*  BUN 76* 84* 38*  CREATININE 9.24* 10.08* 5.93*  CALCIUM 9.0 9.0 8.5*  MG 2.2 2.1  --   PHOS 8.8* 6.8*  --    GFR: Estimated Creatinine Clearance: 11.4 mL/min (A) (by C-G formula based on SCr of 5.93 mg/dL (H)). Liver Function Tests: Recent Labs  Lab 01/28/19 0546 01/30/19 0511  AST 52* 31  ALT 20 14  ALKPHOS 68 62  BILITOT 1.5* 1.2  PROT 7.1 6.7  ALBUMIN 2.7* 2.4*   No results for input(s): LIPASE, AMYLASE in the last 168 hours. No results for input(s): AMMONIA in the last 168 hours. Coagulation Profile: Recent Labs  Lab 01/28/19 0948  INR 1.6*   Cardiac Enzymes: No results for input(s): CKTOTAL, CKMB, CKMBINDEX, TROPONINI in the last 168 hours. BNP (last 3 results) No results for input(s): PROBNP in the last 8760 hours. HbA1C: No results for input(s): HGBA1C in the last 72 hours. CBG: Recent Labs  Lab 01/29/19 1154 01/29/19 1814 01/29/19 2016 01/30/19 0009 01/30/19 0403  GLUCAP 81 134* 150* 149* 116*   Lipid Profile: No results for input(s): CHOL, HDL, LDLCALC, TRIG, CHOLHDL, LDLDIRECT in the last 72 hours. Thyroid Function Tests: No results for input(s): TSH, T4TOTAL, FREET4, T3FREE, THYROIDAB in  the last 72 hours. Anemia Panel: No results for input(s): VITAMINB12, FOLATE, FERRITIN, TIBC, IRON, RETICCTPCT in the last 72 hours. Urine analysis: No results found for: COLORURINE, APPEARANCEUR, LABSPEC, PHURINE, GLUCOSEU, HGBUR, BILIRUBINUR, KETONESUR, PROTEINUR, UROBILINOGEN, NITRITE, LEUKOCYTESUR Sepsis Labs: @LABRCNTIP (procalcitonin:4,lacticidven:4)  ) Recent Results (from the past 240 hour(s))  MRSA PCR Screening     Status: None   Collection Time: 01/28/19 12:22 AM   Specimen: Nasopharyngeal  Result Value Ref Range Status   MRSA by PCR NEGATIVE NEGATIVE Final    Comment:        The GeneXpert MRSA Assay (FDA approved for NASAL specimens only), is one component of a comprehensive  MRSA colonization surveillance program. It is not intended to diagnose MRSA infection nor to guide or monitor treatment for MRSA infections. Performed at [facility], [place] [address], [place], [place] [address]   Culture, blood (routine x 2)     Status: None (Preliminary result)   Collection Time: 01/28/19  3:18 AM   Specimen: BLOOD  Result Value Ref Range Status   Specimen Description BLOOD RIGHT ARM  Final   Special Requests   Final    BOTTLES DRAWN AEROBIC ONLY Blood Culture results may not be optimal due to an inadequate volume of blood received in culture bottles   Culture   Final    NO GROWTH 2 DAYS Performed at [facility], [place] [address], [place], [place] [address]    Report Status PENDING  Incomplete  Culture, blood (routine x 2)     Status: None (Preliminary result)   Collection Time: 01/28/19  3:20 AM   Specimen: BLOOD  Result Value Ref Range Status   Specimen Description BLOOD RIGHT ARM  Final   Special Requests   Final    BOTTLES DRAWN AEROBIC ONLY Blood Culture results may not be optimal due to an inadequate volume of blood received in culture bottles   Culture   Final    NO GROWTH 2 DAYS Performed at [facility], [place] [address], [place],  [address]    Report Status PENDING  Incomplete  SARS Coronavirus 2 (CEPHEID - Performed in [facility]), Hosp Order     Status: None   Collection Time: 01/28/19  8:58 AM   Specimen: Nasopharyngeal Swab  Result Value Ref Range Status   SARS Coronavirus 2 NEGATIVE NEGATIVE Final    Comment: (NOTE) If result is NEGATIVE SARS-CoV-2 target nucleic acids are NOT DETECTED. The SARS-CoV-2 RNA is generally detectable in upper and lower  respiratory specimens during the acute phase of infection. The lowest  concentration of SARS-CoV-2 viral copies this assay can detect is 250  copies / mL. A negative result does not preclude SARS-CoV-2 infection  and should not be used as the sole basis for  treatment or other  patient management decisions.  A negative result may occur with  improper specimen collection / handling, submission of specimen other  than nasopharyngeal swab, presence of viral mutation(s) within the  areas targeted by this assay, and inadequate number of viral copies  (<250 copies / mL). A negative result must be combined with clinical  observations, patient history, and epidemiological information. If result is POSITIVE SARS-CoV-2 target nucleic acids are DETECTED. The SARS-CoV-2 RNA is generally detectable in upper and lower  respiratory specimens dur ing the acute phase of infection.  Positive  results are indicative of active infection with SARS-CoV-2.  Clinical  correlation with patient history and other diagnostic  information is  necessary to determine patient infection status.  Positive results do  not rule out bacterial infection or co-infection with other viruses. If result is PRESUMPTIVE POSTIVE SARS-CoV-2 nucleic acids MAY BE PRESENT.   A presumptive positive result was obtained on the submitted specimen  and confirmed on repeat testing.  While 2019 novel coronavirus  (SARS-CoV-2) nucleic acids may be present in the submitted sample  additional confirmatory testing may be necessary for epidemiological  and / or clinical management purposes  to differentiate between  SARS-CoV-2 and other Sarbecovirus currently known to infect humans.  If clinically indicated additional testing with an alternate test  methodology [phone]) is advised. The SARS-CoV-2 RNA is generally  detectable in upper and lower respiratory sp ecimens during the acute  phase of infection. The expected result is Negative. Fact Sheet for Patients:  StrictlyIdeas.no Fact Sheet for Healthcare Providers: BankingDealers.co.za This test is not yet approved or cleared by the [place] FDA and has been authorized for detection and/or  diagnosis of SARS-CoV-2 by FDA under an Emergency Use Authorization (EUA).  This EUA will remain in effect (meaning this test can be used) for the duration of the COVID-19 declaration under Section 564(b)(1) of the Act, 21 U.S.C. section 360bbb-3(b)(1), unless the authorization is terminated or revoked sooner. Performed at [facility], [place] [address], [place], [place] [address]       Studies: No results found.  Scheduled Meds:  Chlorhexidine Gluconate Cloth  6 each Topical Daily   Chlorhexidine Gluconate Cloth  6 each Topical Q0600   feeding supplement  1 Container Oral TID BM   feeding supplement (PRO-STAT SUGAR FREE 64)  30 mL Oral BID   heparin  5,000 Units Subcutaneous Q8H   insulin aspart  0-9 Units Subcutaneous Q4H   mouth rinse  15 mL Mouth Rinse BID   metoCLOPramide (REGLAN) injection  5 mg Intravenous Q6H   nystatin  5 mL Oral QID   pantoprazole (PROTONIX) IV  40 mg Intravenous Q24H   vancomycin variable dose per unstable renal function (pharmacist dosing)   Does not apply See admin instructions    Continuous Infusions:  levofloxacin (LEVAQUIN) IV 500 mg (01/30/19 0412)     LOS: 2 days     Alma Friendly, MD [facility]  If 7PM-7AM, please contact night-coverage [email or web address] 01/30/2019, 10:45 AM

## 2019-01-30 NOTE — Progress Notes (Signed)
Plan was for enteroscopy as noted this morning, however while waiting for procedure and evaluation by anesthesia, it was noted that he has become progressively more hypertensive.  Blood pressure currently 290 systolic. Dr. Ermalene Postin, with anesthesia, does not feel the procedure is safe to proceed with, which is understandable given hypertension. I discussed this with his hospital physician, Dr. Horris Latino.  She will evaluate and treat hypertension and tachycardia. Once blood pressures are more controlled we can proceed with enteroscopy which may be tomorrow or even Monday depending on response to hypertensive management.

## 2019-01-30 NOTE — Progress Notes (Signed)
On call provider made aware. RN awaiting orders. RN will continue to monitor.     01/30/19 2355  Vitals  BP (!) 190/91  MAP (mmHg) 115  BP Location Right Arm

## 2019-01-31 ENCOUNTER — Encounter (HOSPITAL_COMMUNITY): Payer: Self-pay | Admitting: Internal Medicine

## 2019-01-31 ENCOUNTER — Encounter (HOSPITAL_COMMUNITY)
Admission: AD | Disposition: A | Discharge status: Skilled Nursing Facility | Payer: Self-pay | Source: Other Acute Inpatient Hospital | Attending: Internal Medicine

## 2019-01-31 ENCOUNTER — Inpatient Hospital Stay (HOSPITAL_COMMUNITY): Payer: Medicare PPO | Admitting: Certified Registered Nurse Anesthetist

## 2019-01-31 DIAGNOSIS — R933 Abnormal findings on diagnostic imaging of other parts of digestive tract: Secondary | ICD-10-CM

## 2019-01-31 DIAGNOSIS — K253 Acute gastric ulcer without hemorrhage or perforation: Secondary | ICD-10-CM

## 2019-01-31 DIAGNOSIS — R112 Nausea with vomiting, unspecified: Secondary | ICD-10-CM

## 2019-01-31 HISTORY — PX: ENTEROSCOPY: SHX5533

## 2019-01-31 HISTORY — PX: BIOPSY: SHX5522

## 2019-01-31 LAB — GLUCOSE, CAPILLARY
Glucose-Capillary: 109 mg/dL — ABNORMAL HIGH (ref 70–99)
Glucose-Capillary: 116 mg/dL — ABNORMAL HIGH (ref 70–99)
Glucose-Capillary: 140 mg/dL — ABNORMAL HIGH (ref 70–99)
Glucose-Capillary: 162 mg/dL — ABNORMAL HIGH (ref 70–99)
Glucose-Capillary: 181 mg/dL — ABNORMAL HIGH (ref 70–99)
Glucose-Capillary: 213 mg/dL — ABNORMAL HIGH (ref 70–99)
Glucose-Capillary: 81 mg/dL (ref 70–99)

## 2019-01-31 LAB — COMPREHENSIVE METABOLIC PANEL
ALT: 14 U/L (ref 0–44)
AST: 31 U/L (ref 15–41)
Albumin: 2.4 g/dL — ABNORMAL LOW (ref 3.5–5.0)
Alkaline Phosphatase: 62 U/L (ref 38–126)
Anion gap: 15 (ref 5–15)
BUN: 38 mg/dL — ABNORMAL HIGH (ref 8–23)
CO2: 24 mmol/L (ref 22–32)
Calcium: 8.5 mg/dL — ABNORMAL LOW (ref 8.9–10.3)
Chloride: 94 mmol/L — ABNORMAL LOW (ref 98–111)
Creatinine, Ser: 5.93 mg/dL — ABNORMAL HIGH (ref 0.61–1.24)
GFR calc Af Amer: 11 mL/min — ABNORMAL LOW (ref >60)
GFR calc non Af Amer: 9 mL/min — ABNORMAL LOW (ref >60)
Glucose, Bld: 120 mg/dL — ABNORMAL HIGH (ref 70–99)
Potassium: 3.4 mmol/L — ABNORMAL LOW (ref 3.5–5.1)
Sodium: 133 mmol/L — ABNORMAL LOW (ref 135–145)
Total Bilirubin: 1.2 mg/dL (ref 0.3–1.2)
Total Protein: 6.7 g/dL (ref 6.5–8.1)

## 2019-01-31 LAB — BASIC METABOLIC PANEL
Anion gap: 13 (ref 5–15)
BUN: 22 mg/dL (ref 8–23)
CO2: 25 mmol/L (ref 22–32)
Calcium: 8.7 mg/dL — ABNORMAL LOW (ref 8.9–10.3)
Chloride: 95 mmol/L — ABNORMAL LOW (ref 98–111)
Creatinine, Ser: 4.09 mg/dL — ABNORMAL HIGH (ref 0.61–1.24)
GFR calc Af Amer: 17 mL/min — ABNORMAL LOW (ref >60)
GFR calc non Af Amer: 15 mL/min — ABNORMAL LOW (ref >60)
Glucose, Bld: 100 mg/dL — ABNORMAL HIGH (ref 70–99)
Potassium: 3.9 mmol/L (ref 3.5–5.1)
Sodium: 133 mmol/L — ABNORMAL LOW (ref 135–145)

## 2019-01-31 LAB — CBC WITH DIFFERENTIAL/PLATELET
Abs Immature Granulocytes: 0.1 10*3/uL — ABNORMAL HIGH (ref 0.00–0.07)
Basophils Absolute: 0.1 10*3/uL (ref 0.0–0.1)
Basophils Relative: 1 %
Eosinophils Absolute: 0.7 10*3/uL — ABNORMAL HIGH (ref 0.0–0.5)
Eosinophils Relative: 7 %
HCT: 23.7 % — ABNORMAL LOW (ref 39.0–52.0)
Hemoglobin: 7.5 g/dL — ABNORMAL LOW (ref 13.0–17.0)
Immature Granulocytes: 1 %
Lymphocytes Relative: 8 %
Lymphs Abs: 0.8 10*3/uL (ref 0.7–4.0)
MCH: 25.2 pg — ABNORMAL LOW (ref 26.0–34.0)
MCHC: 31.6 g/dL (ref 30.0–36.0)
MCV: 79.5 fL — ABNORMAL LOW (ref 80.0–100.0)
Monocytes Absolute: 1.2 10*3/uL — ABNORMAL HIGH (ref 0.1–1.0)
Monocytes Relative: 12 %
Neutro Abs: 7.1 10*3/uL (ref 1.7–7.7)
Neutrophils Relative %: 71 %
Platelets: 267 10*3/uL (ref 150–400)
RBC: 2.98 MIL/uL — ABNORMAL LOW (ref 4.22–5.81)
RDW: 13.7 % (ref 11.5–15.5)
WBC: 9.9 10*3/uL (ref 4.0–10.5)
nRBC: 0 % (ref 0.0–0.2)

## 2019-01-31 SURGERY — ENTEROSCOPY
Anesthesia: Monitor Anesthesia Care

## 2019-01-31 MED ORDER — VANCOMYCIN HCL IN DEXTROSE 750-5 MG/150ML-% IV SOLN
750.0000 mg | INTRAVENOUS | Status: AC
  Administered 2019-02-02 – 2019-02-04 (×2): 750 mg via INTRAVENOUS
  Filled 2019-01-31 (×2): qty 150

## 2019-01-31 MED ORDER — PROPOFOL 10 MG/ML IV BOLUS
INTRAVENOUS | Status: DC | PRN
  Administered 2019-01-31 (×2): 20 mg via INTRAVENOUS
  Administered 2019-01-31 (×2): 10 mg via INTRAVENOUS
  Administered 2019-01-31: 20 mg via INTRAVENOUS
  Administered 2019-01-31: 10 mg via INTRAVENOUS
  Administered 2019-01-31: 20 mg via INTRAVENOUS

## 2019-01-31 MED ORDER — HYDRALAZINE HCL 20 MG/ML IJ SOLN
10.0000 mg | Freq: Four times a day (QID) | INTRAMUSCULAR | Status: DC | PRN
  Administered 2019-01-31: 10 mg via INTRAVENOUS
  Filled 2019-01-31: qty 1

## 2019-01-31 MED ORDER — PANTOPRAZOLE SODIUM 40 MG PO TBEC
40.0000 mg | DELAYED_RELEASE_TABLET | Freq: Every day | ORAL | Status: DC
  Administered 2019-01-31 – 2019-02-17 (×18): 40 mg via ORAL
  Filled 2019-01-31 (×18): qty 1

## 2019-01-31 MED ORDER — PHENYLEPHRINE 40 MCG/ML (10ML) SYRINGE FOR IV PUSH (FOR BLOOD PRESSURE SUPPORT)
PREFILLED_SYRINGE | INTRAVENOUS | Status: DC | PRN
  Administered 2019-01-31: 80 ug via INTRAVENOUS

## 2019-01-31 MED ORDER — HYDRALAZINE HCL 50 MG PO TABS
100.0000 mg | ORAL_TABLET | Freq: Three times a day (TID) | ORAL | Status: DC
  Administered 2019-01-31 – 2019-02-17 (×49): 100 mg via ORAL
  Filled 2019-01-31 (×50): qty 2

## 2019-01-31 MED ORDER — METOCLOPRAMIDE HCL 5 MG/ML IJ SOLN
10.0000 mg | Freq: Three times a day (TID) | INTRAMUSCULAR | Status: DC
  Administered 2019-01-31 – 2019-02-10 (×35): 10 mg via INTRAVENOUS
  Filled 2019-01-31 (×37): qty 2

## 2019-01-31 MED ORDER — SODIUM CHLORIDE 0.9 % IV SOLN
INTRAVENOUS | Status: DC | PRN
  Administered 2019-01-31: 10:00:00 via INTRAVENOUS

## 2019-01-31 MED ORDER — ONDANSETRON HCL 4 MG/2ML IJ SOLN
INTRAMUSCULAR | Status: DC | PRN
  Administered 2019-01-31: 4 mg via INTRAVENOUS

## 2019-01-31 MED ORDER — PROPOFOL 500 MG/50ML IV EMUL
INTRAVENOUS | Status: DC | PRN
  Administered 2019-01-31: 100 ug/kg/min via INTRAVENOUS

## 2019-01-31 MED ORDER — CLONIDINE HCL 0.3 MG PO TABS
0.3000 mg | ORAL_TABLET | Freq: Two times a day (BID) | ORAL | Status: DC
  Administered 2019-01-31 – 2019-02-17 (×32): 0.3 mg via ORAL
  Filled 2019-01-31 (×32): qty 1

## 2019-01-31 NOTE — Evaluation (Signed)
Physical Therapy Evaluation Patient Details Name: Cole Nelson MRN: [id number] DOB: October 11, 1955 Today's Date: 01/31/2019   History of Present Illness  63 year old male with history of ESRD on TTS- iHD via LUE AVF, DM, tobacco abuse, diabetic gastroparesis, GERD, HTN, diastolic HF, multiple sclerosis, CVA w/residual left sided weakness, chronic left pleural effusion, hx cdiff, diabetic neuropathy, anemia, HLD, and depression, was admitted on 7/11 to [facility] with acute shortness of breath and hypertensive urgency.  Found on admit to have complete opacification of left hemithorax. Acute on chronic pleural effusions s/p left VATS, concern for pleural mass vs infectious consolidation  Clinical Impression   Pt admitted with above diagnosis. Pt currently with functional limitations due to the deficits listed below (see PT Problem List). Pt reports he is typically ambulatory with RW; Lives with wife, who he reports helps him a lot; not the most reliable historian, so this will need to be confirmed; Presents to PT with generalized weakness, decr functional mobility, difficulty with problem solving; Moving and participating well with limited PT eval done today; Will continue to follow and watch for porgress; if slow progress, I feel it is worth considring CIR for rehab -- he participated well, has assist at home, and rather than dc'ing to another institution (I.e. SNF), I favor getting him home to familiar caregiver, routine and environment as soon as able; Have placed CIR screen for Admission Coord to weigh in;  Pt will benefit from skilled PT to increase their independence and safety with mobility to allow discharge to the venue listed below.       Follow Up Recommendations Home health PT;CIR    Equipment Recommendations  Other (comment)(to be determined)    Recommendations for Other Services OT consult(as ordered)     Precautions / Restrictions Precautions Precautions: Fall       Mobility  Bed Mobility Overal bed mobility: Needs Assistance Bed Mobility: Rolling Rolling: Min guard         General bed mobility comments: Rolled R and L multiple times for hygeine with instructional/directional cues; Performed multiple bridges whiel moving bed to stretcher to go to [employer]  Transfers                    Ambulation/Gait                Stairs            Wheelchair Mobility    Modified Rankin (Stroke Patients Only)       Balance                                             Pertinent Vitals/Pain Pain Assessment: No/denies pain    Home Living Family/patient expects to be discharged to:: Private residence Living Arrangements: Spouse/significant other Available Help at Discharge: Family;Available 24 hours/day Type of Home: House Home Access: Stairs to enter Entrance Stairs-Rails: (Don't know) Entrance Stairs-Number of Steps: 2 Home Layout: One level Home Equipment: Walker - 2 wheels      Prior Function           Comments: Going to EGD, so did not have the opportunity to ask patient     Hand Dominance   Dominant Hand: Right    Extremity/Trunk Assessment   Upper Extremity Assessment Upper Extremity Assessment: Defer to OT evaluation    Lower Extremity Assessment Lower Extremity  Assessment: Generalized weakness(Limited to bed eval; will continue to assess)       Communication   Communication: No difficulties  Cognition Arousal/Alertness: Awake/alert(though a little sleepy) Behavior During Therapy: WFL for tasks assessed/performed Overall Cognitive Status: Impaired/Different from baseline Area of Impairment: Orientation;Following commands;Safety/judgement;Problem solving                 Orientation Level: Disoriented to;Place;Time;Situation     Following Commands: Follows one step commands with increased time Safety/Judgement: Decreased awareness of safety;Decreased awareness of  deficits   Problem Solving: Slow processing;Difficulty sequencing;Requires verbal cues;Requires tactile cues        General Comments General comments (skin integrity, edema, etc.): replaced sacral dressing, which had been soiled with stool    Exercises     Assessment/Plan    PT Assessment Patient needs continued PT services  PT Problem List Decreased strength;Decreased range of motion;Decreased activity tolerance;Decreased balance;Decreased mobility;Decreased cognition;Decreased knowledge of use of DME;Decreased safety awareness;Decreased knowledge of precautions;Decreased coordination       PT Treatment Interventions DME instruction;Gait training;Stair training;Functional mobility training;Therapeutic activities;Therapeutic exercise;Balance training;Neuromuscular re-education;Cognitive remediation;Patient/family education    PT Goals (Current goals can be found in the Care Plan section)  Acute Rehab PT Goals Patient Stated Goal: did not state PT Goal Formulation: With patient Time For Goal Achievement: 02/14/19 Potential to Achieve Goals: Good    Frequency Min 3X/week   Barriers to discharge        Co-evaluation               AM-PAC PT "6 Clicks" Mobility  Outcome Measure Help needed turning from your back to your side while in a flat bed without using bedrails?: A Little Help needed moving from lying on your back to sitting on the side of a flat bed without using bedrails?: A Little Help needed moving to and from a bed to a chair (including a wheelchair)?: A Lot Help needed standing up from a chair using your arms (e.g., wheelchair or bedside chair)?: A Lot Help needed to walk in hospital room?: A Lot Help needed climbing 3-5 steps with a railing? : A Lot 6 Click Score: 14    End of Session   Activity Tolerance: Patient tolerated treatment well Patient left: Other (comment)(with staff en route to Endo) Nurse Communication: Other (comment)(Cleaned up and  enrouted to Endo) PT Visit Diagnosis: Other abnormalities of gait and mobility (R26.89);Muscle weakness (generalized) (M62.81)    Time: 7124-5809 PT Time Calculation (min) (ACUTE ONLY): 18 min   Charges:   PT Evaluation $PT Eval Moderate Complexity: 1 Mod          [name], [place]  Acute Rehabilitation Services Pager [phone] Office [phone]   [name] 01/31/2019, 12:48 PM

## 2019-01-31 NOTE — Progress Notes (Signed)
Rehab Admissions Coordinator Note:  Patient was screened by Cole Nelson for appropriateness for an Inpatient Acute Rehab Consult per PT recs. Limited eval. I await further progress with therapy to assess rehab needs. I will follow.  Cole Burke RN MSN 01/31/2019, 2:22 PM  I can be reached at [phone].

## 2019-01-31 NOTE — Anesthesia Preprocedure Evaluation (Addendum)
Anesthesia Evaluation  Patient identified by MRN, date of birth, ID band Patient awake    Reviewed: Allergy & Precautions, NPO status , Patient's Chart, lab work & pertinent test results  Airway Mallampati: II  TM Distance: >3 FB Neck ROM: Full    Dental  (+) Edentulous Upper, Edentulous Lower   Pulmonary Current Smoker,  Acute on chronic pleural effusions s/p left VATS   Pulmonary exam normal breath sounds clear to auscultation       Cardiovascular hypertension, Pt. on home beta blockers and Pt. on medications +CHF  Normal cardiovascular exam Rhythm:Regular Rate:Normal  ECHO:  1. The left ventricle has hyperdynamic systolic function, with an ejection fraction of >65%. The cavity size was normal. There is mild concentric left ventricular hypertrophy. Left ventricular diastolic Doppler parameters are consistent with impaired  relaxation. No evidence of left ventricular regional wall motion abnormalities.  2. The right ventricle has normal systolic function. The cavity was normal. There is no increase in right ventricular wall thickness. Right ventricular systolic pressure could not be assessed.  3. The tricuspid valve is grossly normal.  4. The aorta is normal in size and structure.   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  Gastroparesis   Endo/Other  diabetes, Insulin Dependent  Renal/GU ESRF and DialysisRenal disease     Musculoskeletal negative musculoskeletal ROS (+)   Abdominal   Peds  Hematology  (+) anemia , HLD   Anesthesia Other Findings Small bowel evaluation for obstruction  Reproductive/Obstetrics                            Anesthesia Physical Anesthesia Plan  ASA: IV  Anesthesia Plan: MAC   Post-op Pain Management:    Induction:  Intravenous  PONV Risk Score and Plan: 1 and Propofol infusion and Treatment may vary due to age or medical condition  Airway Management Planned: Nasal Cannula  Additional Equipment:   Intra-op Plan:   Post-operative Plan:   Informed Consent: I have reviewed the patients History and Physical, chart, labs and discussed the procedure including the risks, benefits and alternatives for the proposed anesthesia with the patient or authorized representative who has indicated his/her understanding and acceptance.       Plan Discussed with: CRNA  Anesthesia Plan Comments:        Anesthesia Quick Evaluation

## 2019-01-31 NOTE — Transfer of Care (Signed)
Immediate Anesthesia Transfer of Care Note  Patient: Cole Nelson  Procedure(s) Performed: ENTEROSCOPY (N/A ) BIOPSY  Patient Location: Endoscopy Unit  Anesthesia Type:MAC  Level of Consciousness: awake, alert  and oriented  Airway & Oxygen Therapy: Patient Spontanous Breathing  Post-op Assessment: Report given to RN and Post -op Vital signs reviewed and stable  Post vital signs: Reviewed and stable  Last Vitals:  Vitals Value Taken Time  BP    Temp    Pulse    Resp    SpO2      Last Pain:  Vitals:   01/31/19 0941  TempSrc: Oral  PainSc: 0-No pain      Patients Stated Pain Goal: 0 (77/03/40 3524)  Complications: No apparent anesthesia complications

## 2019-01-31 NOTE — Progress Notes (Signed)
Pharmacy Antibiotic Note  Cole Nelson is a 63 y.o. male admitted on 01/28/2019 with PNA from OSH. Pharmacy has been consulted for levofloxacin and vancomycin dosing. Pt has hx ESRD on HD TTS, last HD session was on 7/25 (4 hrs, at 450 BFR). -pre HD Vancomycin level elevated at 30 mcg/ml -.WBC= 9.9  Plan: - Continue Levofloxacin 500mg  IV q48h  - Start Vancomycin 750 mg IV qHD TTS - Obtain vanc levels as needed  Height: 5\' 11"  (180.3 cm) Weight: 141 lb 5 oz (64.1 kg) IBW/kg (Calculated) : 75.3  Temp (24hrs), Avg:98.9 F (37.2 C), Min:98.6 F (37 C), Max:99.3 F (37.4 C)  Recent Labs  Lab 01/28/19 0546 01/28/19 0604 01/28/19 0948 01/29/19 0309 01/30/19 0511 01/31/19 0747  WBC 15.5*  --   --  15.2* 11.0* 9.9  CREATININE 9.24*  --   --  10.08* 5.93* 4.09*  LATICACIDVEN  --   --  1.1  --   --   --   VANCORANDOM  --  30  --   --   --   --     Estimated Creatinine Clearance: 17 mL/min (A) (by C-G formula based on SCr of 4.09 mg/dL (H)).    Allergies  Allergen Reactions  . Baclofen     confusion  . Cefepime     Confusion, hives   . Insulins     Patient states his mind started going in and out.   Marland Kitchen Penicillins    Thank you for allowing pharmacy to be a part of this patient's care.  Acey Lav, PharmD  PGY1 Pharmacy Resident [employer] [phone]

## 2019-01-31 NOTE — Plan of Care (Signed)
  Problem: Nutrition: Goal: Adequate nutrition will be maintained Outcome: Progressing   

## 2019-01-31 NOTE — Op Note (Signed)
[facility] Patient Name: Coltyn Hanning Procedure Date : 01/31/2019 MRN: [id number] Attending MD: Jerene Bears , MD Date of Birth: 02-22-1956 CSN: [id number] Age: 63 Admit Type: Inpatient Procedure:                Small bowel enteroscopy Indications:              Nausea with vomiting, Abnormal CT of the GI tract                            (question of compression at third portion of                            duodenum near aorta and superior mesenteric artery) Providers:                Lajuan Lines. Hilarie Fredrickson, MD, Burtis Junes, RN, Elspeth Cho                            Tech., Technician, Clearnce Sorrel, CRNA Referring MD:             [facility] Medicines:                Monitored Anesthesia Care Complications:            No immediate complications. Estimated Blood Loss:     Estimated blood loss was minimal. Procedure:                Pre-Anesthesia Assessment:                           - Prior to the procedure, a History and Physical                            was performed, and patient medications and                            allergies were reviewed. The patient's tolerance of                            previous anesthesia was also reviewed. The risks                            and benefits of the procedure and the sedation                            options and risks were discussed with the patient.                            All questions were answered, and informed consent                            was obtained. Prior Anticoagulants: The patient has                            taken no previous anticoagulant or antiplatelet  agents. ASA Grade Assessment: III - A patient with                            severe systemic disease. After reviewing the risks                            and benefits, the patient was deemed in                            satisfactory condition to undergo the procedure.                           After obtaining  informed consent, the endoscope was                            passed under direct vision. Throughout the                            procedure, the patient's blood pressure, pulse, and                            oxygen saturations were monitored continuously. The                            [id number]-PH190L ([id number] Olympus ultra slim colonoscope                            was introduced through the mouth and advanced to                            the proximal jejunum. The small bowel enteroscopy                            was accomplished without difficulty. The patient                            tolerated the procedure well. Scope In: Scope Out: Findings:      The examined esophagus was normal.      A small amount of food (residue) was found in the gastric body. This was       able to be cleared with irrigation and lavage (consistent with known       gastroparesis)      A large stellate scar most likely from healed ulcer was found in the       gastric body. Adjacent mucosal findings in the gastric body includes       erosions (nonbleeding). This area was biopsied at the scar and surround       mucosa with a cold forceps for histology and to exclude H. Pylori.      Diffuse mild mucosal changes characterized by discoloration were found       in the entire duodenum. This has the appearance of melanosis. Biopsies       were taken with a cold forceps for histology. There was no evidence of       inflammation, narrowing, strictures or obstructions.  Diffuse mild mucosal changes characterized by discoloration were found       in the jejunum. This has the appearance of melanosis.      Exam of the jejunum was otherwise normal. Impression:               - Normal esophagus.                           - A small amount of food (residue) in the stomach.                            Consistent with gastroparesis.                           - Scar in the gastric body (likely from healed                             ulcer) with scattered gastric erosions. Biopsied.                           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 Biopsied.                           - No evidence of inflammation, stricture, narrowing                            or obstruction in the examined duodenum and                            proximal jejunum. Moderate Sedation:      N/A Recommendation:           - Return patient to hospital ward for ongoing care.                           - Advance diet as tolerated; goal diet is                            gastroparesis diet.                           - Await pathology results.                           - Would continue metoclopramide TID-AC and HS until  symptoms improve.                           - Daily PPI.                           - Call GI consult team if further questions. Procedure Code(s):        --- Professional ---                           [phone], Small intestinal endoscopy, enteroscopy                            beyond second portion of duodenum, not including                            ileum; with biopsy, single or multiple Diagnosis Code(s):        --- Professional ---                           K31.89, Other diseases of stomach and duodenum                           K63.89, Other specified diseases of intestine                           R11.2, Nausea with vomiting, unspecified                           R93.3, Abnormal findings on diagnostic imaging of                            other parts of digestive tract CPT copyright 2019 American Medical Association. All rights reserved. The codes documented in this report are preliminary and upon coder review may  be revised to meet current compliance requirements. Jerene Bears,  MD 01/31/2019 11:11:31 AM This report has been signed electronically. Number of Addenda: 0

## 2019-01-31 NOTE — Anesthesia Postprocedure Evaluation (Signed)
Anesthesia Post Note  Patient: Cole Nelson  Procedure(s) Performed: ENTEROSCOPY (N/A ) BIOPSY     Patient location during evaluation: PACU Anesthesia Type: MAC Level of consciousness: awake and alert Pain management: pain level controlled Vital Signs Assessment: post-procedure vital signs reviewed and stable Respiratory status: spontaneous breathing, nonlabored ventilation, respiratory function stable and patient connected to nasal cannula oxygen Cardiovascular status: stable and blood pressure returned to baseline Postop Assessment: no apparent nausea or vomiting Anesthetic complications: no    Last Vitals:  Vitals:   01/31/19 1204 01/31/19 1352  BP: (!) 181/89 (!) 163/72  Pulse: 100 97  Resp: 20   Temp: 36.8 C   SpO2: 96%     Last Pain:  Vitals:   01/31/19 1204  TempSrc: Oral  PainSc:                  Antero Derosia P Charan Prieto

## 2019-01-31 NOTE — Progress Notes (Signed)
Admit: 01/28/2019 LOS: 3  63M ESRD, CHF, hx/o CVA L sided paresis, DM, ?dementia, admit after VATS for chronic L pleural effusion ? Pulsating mass and SMA syndrome  Subjective:  . EGD this AM, no lesions at duodenum . No interval complaints  . Labile BPs . HD yesterday, 1.5L UF  07/25 0701 - 07/26 0700 In: 1160 [P.O.:1060; IV Piggyback:100] Out: 1500   Filed Weights   01/30/19 1430 01/30/19 1740 01/31/19 0300  Weight: 65.2 kg 63.8 kg 64.1 kg    Scheduled Meds: . amLODipine  5 mg Oral BID  . aspirin  81 mg Oral Daily  . budesonide  0.25 mg Nebulization BID  . carvedilol  25 mg Oral BID  . Chlorhexidine Gluconate Cloth  6 each Topical Daily  . Chlorhexidine Gluconate Cloth  6 each Topical Q0600  . feeding supplement  1 Container Oral TID BM  . feeding supplement (PRO-STAT SUGAR FREE 64)  30 mL Oral BID  . ferrous sulfate  325 mg Oral Daily  . FLUoxetine  20 mg Oral Daily  . heparin  5,000 Units Subcutaneous Q8H  . insulin aspart  0-9 Units Subcutaneous Q4H  . mouth rinse  15 mL Mouth Rinse BID  . metoCLOPramide (REGLAN) injection  10 mg Intravenous TID AC & HS  . montelukast  10 mg Oral Daily  . multivitamin  1 tablet Oral QHS  . nystatin  5 mL Oral QID  . pantoprazole  40 mg Oral Q0600  . vancomycin variable dose per unstable renal function (pharmacist dosing)   Does not apply See admin instructions   Continuous Infusions: . levofloxacin (LEVAQUIN) IV 500 mg (01/30/19 0412)  . [START ON 02/02/2019] vancomycin     PRN Meds:.acetaminophen, fluticasone, hydrALAZINE, labetalol, ondansetron (ZOFRAN) IV, promethazine  Current Labs: reviewed    Physical Exam:  Blood pressure (!) 181/89, pulse 100, temperature 98.2 F (36.8 C), temperature source Oral, resp. rate 20, height 5\' 11"  (1.803 m), weight 64.1 kg, SpO2 96 %. Chronically ill appearing RRR nl s1s2 Coarse bs b/l No LEE LUE AVF +B/T EOMI L sided paresis  Dialysis Prescription: [facility] TTS, Optiflux  180, EDW ?77kg, 3hr 95min, BFR 450/DFR 800, 2K/2.5Ca, Epogen 10,000 units TIW, Hectorol 51mcg TIW, ONS, LUA AVF 15G, Standard Sodium, Heparin 2000units load and 700units/hr  A 1. ESRD THS [facility] LUE AVF 2. Anemia,  3. CKD BMD: follow as he resumes some PO 4. PCM 5. ?SMA syndrome, felt not liekly 6. Chronic pleural effusion  P . Cont HD on THS schedule . Medication Issues; o Preferred narcotic agents for pain control are hydromorphone, fentanyl, and methadone. Morphine should not be used.  o Baclofen should be avoided o Avoid oral sodium phosphate and magnesium citrate based laxatives / bowel preps    Pearson Grippe MD 01/31/2019, 12:53 PM  Recent Labs  Lab 01/28/19 0546 01/29/19 0309 01/30/19 0511 01/31/19 0747  NA 136 135 133* 133*  K 5.3* 3.1* 3.4* 3.9  CL 85* 86* 94* 95*  CO2 28 28 24 25   GLUCOSE 132* 84 120* 100*  BUN 76* 84* 38* 22  CREATININE 9.24* 10.08* 5.93* 4.09*  CALCIUM 9.0 9.0 8.5* 8.7*  PHOS 8.8* 6.8*  --   --    Recent Labs  Lab 01/28/19 0546 01/29/19 0309 01/30/19 0511 01/31/19 0747  WBC 15.5* 15.2* 11.0* 9.9  NEUTROABS 12.6*  --  8.1* 7.1  HGB 11.0* 8.2* 8.0* 7.5*  HCT 35.0* 25.6* 25.1* 23.7*  MCV 78.3* 77.1* 77.7*  79.5*  PLT 263 311 PLATELET CLUMPS NOTED ON SMEAR, COUNT APPEARS ADEQUATE 267

## 2019-01-31 NOTE — Interval H&P Note (Signed)
History and Physical Interval Note: For enteroscopy today.  Was scheduled for yesterday but procedure delayed due to hypertension which is been managed at this point.  Blood pressures have improved though he remains slightly hypertensive. No complaints currently. The nature of the procedure, as well as the risks, benefits, and alternatives were carefully and thoroughly reviewed with the patient. Ample time for discussion and questions allowed. The patient understood, was satisfied, and agreed to proceed.   CBC Latest Ref Rng & Units 01/31/2019 01/30/2019 01/29/2019  WBC 4.0 - 10.5 K/uL 9.9 11.0(H) 15.2(H)  Hemoglobin 13.0 - 17.0 g/dL 7.5(L) 8.0(L) 8.2(L)  Hematocrit 39.0 - 52.0 % 23.7(L) 25.1(L) 25.6(L)  Platelets 150 - 400 K/uL 267 PLATELET CLUMPS NOTED ON SMEAR, COUNT APPEARS ADEQUATE 311      01/31/2019 9:56 AM  Cole Nelson  has presented today for surgery, with the diagnosis of small bowel evaluation for obstruction.  The various methods of treatment have been discussed with the patient and family. After consideration of risks, benefits and other options for treatment, the patient has consented to  Procedure(s): ENTEROSCOPY (N/A) as a surgical intervention.  The patient's history has been reviewed, patient examined, no change in status, stable for surgery.  I have reviewed the patient's chart and labs.  Questions were answered to the patient's satisfaction.     [name]

## 2019-01-31 NOTE — Progress Notes (Signed)
See enteroscopy procedure note for details  I spoke to the patient's wife by phone after his enteroscopy. We reviewed the findings and enteroscopy.  Time provided for questions and answers. We will await gastric biopsies to exclude H. Pylori Continue with metoclopramide for treatment of gastroparesis No overt narrowings or stenosis in the examined duodenum and proximal jejunum Advance diet and see how he does GI will be available, call with questions

## 2019-01-31 NOTE — Progress Notes (Signed)
PROGRESS NOTE  Cole Nelson WUJ:[id number] DOB: 27-Nov-1955 DOA: 01/28/2019 PCP: Patient, No Pcp Per  HPI/Recap of past 24 hours: HPI from Critical care team 63 year old male with history of ESRD on TTS- iHD via LUE AVF, DM, tobacco abuse, diabetic gastroparesis, GERD, HTN, diastolic HF, multiple sclerosis, CVA w/residual left sided weakness, chronic left pleural effusion, hx cdiff, diabetic neuropathy, anemia, HLD, and depression, was admitted on 7/11 to [facility] with acute shortness of breath and hypertensive urgency.  Found on admit to have complete opacification of left hemithorax.  Previous thoracentesis in September 02 2018 was transudative.  Underwent left thoracentesis on 7/11 with 2300 ml of dark red fluid removed, found to be exudative with cytology negative for neoplasm. Underwent left VATS on 7/13 with placement of chest tube found to have pleural pulsatile masses. CT chest then performed which showed consolidation or mass along the pleural surface of the anterior lateral left upper lobe. The case was discussed with cardiothoracic at [facility] with some question that this was acute consolidation rather than a mass and recommended further outpatient follow up after completion of antibiotic therapy. Patient was continued on levofloxacin.    During his hospitalization, patient with recurrent bouts of nausea and vomiting in which wife had reported that patient has been having 3 to 4 times a month over the last year. CT abdomen obtained showed marked gastric distention with dilation of the 2nd and proximal 3rd portions of the duodenum possible narrowing at the level of the hiatus between the aorta and superior mesenteric artery consistent with SMA syndrome.  A nasal gastric tube was placed, and stomach eventually decompressed. A CT scan here 7/23 now shows that the stomach is decompressed. 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 GI consulted, and recommended an enteroscopy. PCCM consulted [facility], assumed care on 01/30/2019      Today, patient denies any new complaints.  Assessment/Plan: Active Problems:   Acute encephalopathy   Pressure injury of skin   Pleural effusion   End-stage renal disease on hemodialysis (HCC)   Protein-calorie malnutrition, severe   Nausea and vomiting   Acute gastric erosion   Abnormal CT scan, small bowel  Acute on chronic pleural effusions s/p left VATS, concern for pleural mass vs infectious consolidation Afebrile, with resolving leukocytosis, currently off oxygen BC x2 NGTD Pleural Fluid cytology with no evidence of neoplasia  Continue renally dosed vancomycin, Levaquin Pulmonary Hygiene  Plan is to likely follow-up with cardiothoracic surgeons at [facility] for further management once stable  SBO, 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  Melanosis in the duodenum and jejunum, biopsied.  No evidence of inflammation, stricture, narrowing obstruction in the examined duodenum and proximal jejunum GI recommends gastroparesis diet, metoclopramide, daily PPI  Gastroparesis Continue metoclopramide  HTN/Diastolic HF Uncontrolled hypertension Continue appears euvolemic Echo with hyperdynamic LVEF  IV labetalol as needed Continue home meds: Amlodipine, Coreg, hydralazine, clonidine  ESRD on TTS HD per nephrology   Anemia of chronic kidney disease No signs of obvious bleeding Daily CBC  Diabetes mellitus type 2 SSI, Accu-Cheks, hypoglycemic protocol Hold home regimen for now  Multiple sclerosis Stable  GERD Continue PPI          Malnutrition Type:  Nutrition Problem: Severe Malnutrition Etiology: chronic illness(ESRD on HD, CHF)   Malnutrition Characteristics:  Signs/Symptoms: severe muscle depletion, severe fat depletion   Nutrition Interventions:  Interventions: Boost Breeze, Prostat    Estimated body mass index is 19.71 kg/m as calculated from the following:   Height as of this encounter: 5\' 11"  (1.803 m).   Weight as of this encounter: 64.1 kg.     Code Status: Full  Family Communication: Discussed with patient  Disposition Plan: To be determined   Consultants:  PCCM  General surgery  GI  Nephrology  Procedures:  None  Antimicrobials:  Vancomycin  Levaquin  DVT prophylaxis: Heparin   Objective: Vitals:   01/31/19 1115 01/31/19 1125 01/31/19 1204 01/31/19 1352  BP: (!) 195/55 (!) 202/61 (!) 181/89 (!) 163/72  Pulse: 100 100 100 97  Resp: 19 20 20    Temp:   98.2 F (36.8 C)   TempSrc:   Oral   SpO2: 94% 92% 96%   Weight:      Height:        Intake/Output Summary (Last 24 hours) at 01/31/2019 1541 Last data filed at 01/31/2019 1300 Gross per 24 hour  Intake 800 ml  Output 1500 ml  Net -700 ml   Filed Weights   01/30/19 1430 01/30/19 1740 01/31/19 0300  Weight: 65.2 kg 63.8 kg 64.1 kg    Exam:  General: NAD, cachectic, chronically ill-appearing  Cardiovascular: S1, S2 present  Respiratory: CTAB  Abdomen: Soft, nontender, nondistended, bowel sounds present  Musculoskeletal: No bilateral pedal edema noted  Skin: Normal  Psychiatry: Normal mood   Data Reviewed: CBC: Recent Labs  Lab 01/28/19 0546 01/29/19 0309 01/30/19 0511 01/31/19 0747  WBC 15.5* 15.2* 11.0* 9.9  NEUTROABS 12.6*  --  8.1* 7.1  HGB 11.0* 8.2* 8.0* 7.5*  HCT 35.0* 25.6* 25.1* 23.7*  MCV 78.3* 77.1* 77.7* 79.5*  PLT 263 311 PLATELET CLUMPS NOTED ON SMEAR, COUNT APPEARS ADEQUATE 076   Basic Metabolic Panel: Recent Labs  Lab 01/28/19 0546 01/29/19 0309  01/30/19 0511 01/31/19 0747  NA 136 135 133* 133*  K 5.3* 3.1* 3.4* 3.9  CL 85* 86* 94* 95*  CO2 28 28 24 25   GLUCOSE 132* 84 120* 100*  BUN 76* 84* 38* 22  CREATININE 9.24* 10.08* 5.93* 4.09*  CALCIUM 9.0 9.0 8.5* 8.7*  MG 2.2 2.1  --   --   PHOS 8.8* 6.8*  --   --    GFR: Estimated Creatinine Clearance: 17 mL/min (A) (by C-G formula based on SCr of 4.09 mg/dL (H)). Liver Function Tests: Recent Labs  Lab 01/28/19 0546 01/30/19 0511  AST 52* 31  ALT 20 14  ALKPHOS 68 62  BILITOT 1.5* 1.2  PROT 7.1 6.7  ALBUMIN 2.7* 2.4*   No results for input(s): LIPASE, AMYLASE in the last 168 hours. No results for input(s): AMMONIA in the last 168 hours. Coagulation Profile: Recent Labs  Lab 01/28/19 0948  INR 1.6*   Cardiac Enzymes: No results for input(s): CKTOTAL, CKMB, CKMBINDEX, TROPONINI in the last 168 hours. BNP (last 3 results) No results for input(s): PROBNP in the last 8760 hours. HbA1C: No results for input(s): HGBA1C in the last 72 hours. CBG: Recent Labs  Lab 01/30/19 2032 01/31/19 0031 01/31/19 0440 01/31/19 0819 01/31/19 1208  GLUCAP 116* 213* 81 109* 116*   Lipid Profile: No results for input(s): CHOL, HDL, LDLCALC, TRIG,  CHOLHDL, LDLDIRECT in the last 72 hours. Thyroid Function Tests: No results for input(s): TSH, T4TOTAL, FREET4, T3FREE, THYROIDAB in the last 72 hours. Anemia Panel: No results for input(s): VITAMINB12, FOLATE, FERRITIN, TIBC, IRON, RETICCTPCT in the last 72 hours. Urine analysis: No results found for: COLORURINE, APPEARANCEUR, LABSPEC, PHURINE, GLUCOSEU, HGBUR, BILIRUBINUR, KETONESUR, PROTEINUR, UROBILINOGEN, NITRITE, LEUKOCYTESUR Sepsis Labs: @LABRCNTIP (procalcitonin:4,lacticidven:4)  ) Recent Results (from the past 240 hour(s))  MRSA PCR Screening     Status: None   Collection Time: 01/28/19 12:22 AM   Specimen: Nasopharyngeal  Result Value Ref Range Status   MRSA by PCR NEGATIVE NEGATIVE Final    Comment:        The  GeneXpert MRSA Assay (FDA approved for NASAL specimens only), is one component of a comprehensive MRSA colonization surveillance program. It is not intended to diagnose MRSA infection nor to guide or monitor treatment for MRSA infections. Performed at [facility], [place] [address], [place], [place] [address]   Culture, blood (routine x 2)     Status: None (Preliminary result)   Collection Time: 01/28/19  3:18 AM   Specimen: BLOOD  Result Value Ref Range Status   Specimen Description BLOOD RIGHT ARM  Final   Special Requests   Final    BOTTLES DRAWN AEROBIC ONLY Blood Culture results may not be optimal due to an inadequate volume of blood received in culture bottles   Culture   Final    NO GROWTH 3 DAYS Performed at [facility], [place] [address], [place], [place] [address]    Report Status PENDING  Incomplete  Culture, blood (routine x 2)     Status: None (Preliminary result)   Collection Time: 01/28/19  3:20 AM   Specimen: BLOOD  Result Value Ref Range Status   Specimen Description BLOOD RIGHT ARM  Final   Special Requests   Final    BOTTLES DRAWN AEROBIC ONLY Blood Culture results may not be optimal due to an inadequate volume of blood received in culture bottles   Culture   Final    NO GROWTH 3 DAYS Performed at [facility], [place] [address] [place],  [address]    Report Status PENDING  Incomplete  SARS Coronavirus 2 (CEPHEID - Performed in [place] hospital lab), Hosp Order     Status: None   Collection Time: 01/28/19  8:58 AM   Specimen: Nasopharyngeal Swab  Result Value Ref Range Status   SARS Coronavirus 2 NEGATIVE NEGATIVE Final    Comment: (NOTE) If result is NEGATIVE SARS-CoV-2 target nucleic acids are NOT DETECTED. The SARS-CoV-2 RNA is generally detectable in upper and lower  respiratory specimens during the acute phase of infection. The lowest  concentration of SARS-CoV-2 viral copies this assay can detect is 250  copies / mL. A  negative result does not preclude SARS-CoV-2 infection  and should not be used as the sole basis for treatment or other  patient management decisions.  A negative result may occur with  improper specimen collection / handling, submission of specimen other  than nasopharyngeal swab, presence of viral mutation(s) within the  areas targeted by this assay, and inadequate number of viral copies  (<250 copies / mL). A negative result must be combined with clinical  observations, patient history, and epidemiological information. If result is POSITIVE SARS-CoV-2 target nucleic acids are DETECTED. The SARS-CoV-2 RNA is generally detectable in upper and lower  respiratory specimens dur ing the acute phase of infection.  Positive  results are indicative of active infection with SARS-CoV-2.  Clinical  correlation with patient history and other diagnostic information is  necessary to determine patient infection status.  Positive results do  not rule out bacterial infection or co-infection with other viruses. If result is PRESUMPTIVE POSTIVE SARS-CoV-2 nucleic acids MAY BE PRESENT.   A presumptive positive result was obtained on the submitted specimen  and confirmed on repeat testing.  While 2019 novel coronavirus  (SARS-CoV-2) nucleic acids may be present in the submitted sample  additional confirmatory testing may be necessary for epidemiological  and / or clinical management purposes  to differentiate between  SARS-CoV-2 and other Sarbecovirus currently known to infect humans.  If clinically indicated additional testing with an alternate test  methodology [phone]) is advised. The SARS-CoV-2 RNA is generally  detectable in upper and lower respiratory sp ecimens during the acute  phase of infection. The expected result is Negative. Fact Sheet for Patients:  StrictlyIdeas.no Fact Sheet for Healthcare Providers: BankingDealers.co.za This test is not  yet approved or cleared by the [place] FDA and has been authorized for detection and/or diagnosis of SARS-CoV-2 by FDA under an Emergency Use Authorization (EUA).  This EUA will remain in effect (meaning this test can be used) for the duration of the COVID-19 declaration under Section 564(b)(1) of the Act, 21 U.S.C. section 360bbb-3(b)(1), unless the authorization is terminated or revoked sooner. Performed at [facility], [place] [address], [place], [place] [address]       Studies: No results found.  Scheduled Meds:  amLODipine  5 mg Oral BID   aspirin  81 mg Oral Daily   budesonide  0.25 mg Nebulization BID   carvedilol  25 mg Oral BID   Chlorhexidine Gluconate Cloth  6 each Topical Daily   Chlorhexidine Gluconate Cloth  6 each Topical Q0600   feeding supplement  1 Container Oral TID BM   feeding supplement (PRO-STAT SUGAR FREE 64)  30 mL Oral BID   ferrous sulfate  325 mg Oral Daily   FLUoxetine  20 mg Oral Daily   heparin  5,000 Units Subcutaneous Q8H   insulin aspart  0-9 Units Subcutaneous Q4H   mouth rinse  15 mL Mouth Rinse BID   metoCLOPramide (REGLAN) injection  10 mg Intravenous TID AC & HS   montelukast  10 mg Oral Daily   multivitamin  1 tablet Oral QHS   nystatin  5 mL Oral QID   pantoprazole  40 mg Oral Q0600   vancomycin variable dose per unstable renal function (pharmacist dosing)   Does not apply See admin instructions    Continuous Infusions:  levofloxacin (LEVAQUIN) IV 500 mg (01/30/19 0412)   [START ON 02/02/2019] vancomycin       LOS: 3 days     Alma Friendly, MD [facility]  If 7PM-7AM, please contact night-coverage [email or web address] 01/31/2019, 3:41 PM

## 2019-02-01 ENCOUNTER — Inpatient Hospital Stay (HOSPITAL_COMMUNITY): Payer: Medicare PPO

## 2019-02-01 LAB — GLUCOSE, CAPILLARY
Glucose-Capillary: 121 mg/dL — ABNORMAL HIGH (ref 70–99)
Glucose-Capillary: 127 mg/dL — ABNORMAL HIGH (ref 70–99)
Glucose-Capillary: 131 mg/dL — ABNORMAL HIGH (ref 70–99)
Glucose-Capillary: 132 mg/dL — ABNORMAL HIGH (ref 70–99)
Glucose-Capillary: 134 mg/dL — ABNORMAL HIGH (ref 70–99)
Glucose-Capillary: 164 mg/dL — ABNORMAL HIGH (ref 70–99)
Glucose-Capillary: 169 mg/dL — ABNORMAL HIGH (ref 70–99)

## 2019-02-01 LAB — BASIC METABOLIC PANEL
Anion gap: 13 (ref 5–15)
BUN: 37 mg/dL — ABNORMAL HIGH (ref 8–23)
CO2: 23 mmol/L (ref 22–32)
Calcium: 8.6 mg/dL — ABNORMAL LOW (ref 8.9–10.3)
Chloride: 94 mmol/L — ABNORMAL LOW (ref 98–111)
Creatinine, Ser: 5.82 mg/dL — ABNORMAL HIGH (ref 0.61–1.24)
GFR calc Af Amer: 11 mL/min — ABNORMAL LOW (ref >60)
GFR calc non Af Amer: 10 mL/min — ABNORMAL LOW (ref >60)
Glucose, Bld: 219 mg/dL — ABNORMAL HIGH (ref 70–99)
Potassium: 4.1 mmol/L (ref 3.5–5.1)
Sodium: 130 mmol/L — ABNORMAL LOW (ref 135–145)

## 2019-02-01 LAB — CBC WITH DIFFERENTIAL/PLATELET
Abs Immature Granulocytes: 0.05 10*3/uL (ref 0.00–0.07)
Basophils Absolute: 0.1 10*3/uL (ref 0.0–0.1)
Basophils Relative: 1 %
Eosinophils Absolute: 0.5 10*3/uL (ref 0.0–0.5)
Eosinophils Relative: 8 %
HCT: 23.3 % — ABNORMAL LOW (ref 39.0–52.0)
Hemoglobin: 7.3 g/dL — ABNORMAL LOW (ref 13.0–17.0)
Immature Granulocytes: 1 %
Lymphocytes Relative: 12 %
Lymphs Abs: 0.9 10*3/uL (ref 0.7–4.0)
MCH: 24.9 pg — ABNORMAL LOW (ref 26.0–34.0)
MCHC: 31.3 g/dL (ref 30.0–36.0)
MCV: 79.5 fL — ABNORMAL LOW (ref 80.0–100.0)
Monocytes Absolute: 0.7 10*3/uL (ref 0.1–1.0)
Monocytes Relative: 11 %
Neutro Abs: 4.7 10*3/uL (ref 1.7–7.7)
Neutrophils Relative %: 67 %
Platelets: 265 10*3/uL (ref 150–400)
RBC: 2.93 MIL/uL — ABNORMAL LOW (ref 4.22–5.81)
RDW: 14.1 % (ref 11.5–15.5)
WBC: 6.9 10*3/uL (ref 4.0–10.5)
nRBC: 0 % (ref 0.0–0.2)

## 2019-02-01 MED ORDER — SODIUM CHLORIDE 0.9 % IV SOLN
INTRAVENOUS | Status: DC | PRN
  Administered 2019-02-01: 250 mL via INTRAVENOUS

## 2019-02-01 MED ORDER — IPRATROPIUM-ALBUTEROL 0.5-2.5 (3) MG/3ML IN SOLN: 3.0000 mL | Freq: Four times a day (QID) | RESPIRATORY_TRACT | Status: DC

## 2019-02-01 MED ORDER — VANCOMYCIN HCL IN DEXTROSE 750-5 MG/150ML-% IV SOLN
750.0000 mg | Freq: Once | INTRAVENOUS | Status: AC
  Administered 2019-02-01: 750 mg via INTRAVENOUS
  Filled 2019-02-01: qty 150

## 2019-02-01 MED ORDER — IPRATROPIUM-ALBUTEROL 0.5-2.5 (3) MG/3ML IN SOLN: 3.0000 mL | RESPIRATORY_TRACT | Status: DC | PRN

## 2019-02-01 MED ORDER — DARBEPOETIN ALFA 60 MCG/0.3ML IJ SOSY
60.0000 ug | PREFILLED_SYRINGE | INTRAMUSCULAR | Status: DC
  Administered 2019-02-02 – 2019-02-16 (×3): 60 ug via INTRAVENOUS
  Filled 2019-02-01 (×2): qty 0.3

## 2019-02-01 NOTE — Care Management Important Message (Signed)
Important Message  Patient Details  Name: Cole Nelson MRN: [id number] Date of Birth: [birth year]   Medicare Important Message Given:  Yes     Orbie Pyo 02/01/2019, 2:56 PM

## 2019-02-01 NOTE — Final Consult Note (Signed)
Consultant Final Sign-Off Note    Assessment/Final recommendations  Cole Nelson is a 63 y.o. 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 Currently on CM diet. Tolerating No indication for surgery. We will sign off.   Wound care (if applicable):    Diet at discharge: per primary team   Activity at discharge: per primary team   Follow-up appointment: As needed   Pending results:  Unresulted Labs (From admission, onward)    Start     Ordered   01/31/19 0500  CBC with Differential/Platelet  Daily,   R    Question:  Specimen collection method  Answer:  Lab=Lab collect   01/30/19 1145   01/31/19 1740  Basic metabolic panel  Daily,   R    Question:  Specimen collection method  Answer:  Lab=Lab collect   01/30/19 1145   01/28/19 0044  Urine culture  Once,   R     01/28/19 0044           Medication recommendations:   Other recommendations:    Thank you for allowing [place] to participate in the care of your patient!  Please consult [place] again if you have further needs for your patient.  Barth Kirks [facility] 02/01/2019 11:55 AM    Subjective   Doing well. Tolerating diet without N/V.   Objective  Vital signs in last 24 hours: Temp:  [98.2 F (36.8 C)-98.8 F (37.1 C)] 98.6 F (37 C) (07/27 1128) Pulse Rate:  [85-111] 85 (07/27 1128) Resp:  [16-20] 20 (07/27 1128) BP: (130-181)/(66-89) 151/78 (07/27 1128) SpO2:  [94 %-97 %] 94 % (07/27 1128) Weight:  [64.7 kg] 64.7 kg (07/27 0225)  Gen: Awake and alert, NAD Lungs: Normal rate and effrot Abd: Soft, ND, NT, +BS   Pertinent labs and Studies: Recent Labs    01/30/19 0511 01/31/19 0747 02/01/19 0719  WBC 11.0* 9.9 6.9  HGB 8.0* 7.5* 7.3*  HCT 25.1* 23.7* 23.3*   BMET Recent Labs     01/31/19 0747 02/01/19 0719  NA 133* 130*  K 3.9 4.1  CL 95* 94*  CO2 25 23  GLUCOSE 100* 219*  BUN 22 37*  CREATININE 4.09* 5.82*  CALCIUM 8.7* 8.6*   No results for input(s): LABURIN in the last 72 hours. Results for orders placed or performed during the hospital encounter of 01/28/19  MRSA PCR Screening     Status: None   Collection Time: 01/28/19 12:22 AM   Specimen: Nasopharyngeal  Result Value Ref Range Status   MRSA by PCR NEGATIVE NEGATIVE Final    Comment:        The GeneXpert MRSA Assay (FDA approved for NASAL specimens only), is one component of a comprehensive MRSA colonization surveillance program. It is not intended to diagnose MRSA infection nor to guide or monitor treatment for MRSA infections. Performed at [facility], [place] [address], [place], [place] [address]   Culture, blood (routine x 2)     Status: None (Preliminary result)   Collection Time: 01/28/19  3:18 AM   Specimen: BLOOD  Result Value Ref Range Status   Specimen Description BLOOD RIGHT ARM  Final   Special Requests   Final    BOTTLES DRAWN AEROBIC ONLY Blood Culture results may not be optimal due to an inadequate volume  of blood received in culture bottles   Culture   Final    NO GROWTH 4 DAYS Performed at [facility], [place] [address], [place], [place] [address]    Report Status PENDING  Incomplete  Culture, blood (routine x 2)     Status: None (Preliminary result)   Collection Time: 01/28/19  3:20 AM   Specimen: BLOOD  Result Value Ref Range Status   Specimen Description BLOOD RIGHT ARM  Final   Special Requests   Final    BOTTLES DRAWN AEROBIC ONLY Blood Culture results may not be optimal due to an inadequate volume of blood received in culture bottles   Culture   Final    NO GROWTH 4 DAYS Performed at [facility], [place] [address] [place], [place] [address]    Report Status PENDING  Incomplete  SARS Coronavirus 2 (CEPHEID - Performed in [place] hospital  lab), Hosp Order     Status: None   Collection Time: 01/28/19  8:58 AM   Specimen: Nasopharyngeal Swab  Result Value Ref Range Status   SARS Coronavirus 2 NEGATIVE NEGATIVE Final    Comment: (NOTE) If result is NEGATIVE SARS-CoV-2 target nucleic acids are NOT DETECTED. The SARS-CoV-2 RNA is generally detectable in upper and lower  respiratory specimens during the acute phase of infection. The lowest  concentration of SARS-CoV-2 viral copies this assay can detect is 250  copies / mL. A negative result does not preclude SARS-CoV-2 infection  and should not be used as the sole basis for treatment or other  patient management decisions.  A negative result may occur with  improper specimen collection / handling, submission of specimen other  than nasopharyngeal swab, presence of viral mutation(s) within the  areas targeted by this assay, and inadequate number of viral copies  (<250 copies / mL). A negative result must be combined with clinical  observations, patient history, and epidemiological information. If result is POSITIVE SARS-CoV-2 target nucleic acids are DETECTED. The SARS-CoV-2 RNA is generally detectable in upper and lower  respiratory specimens dur ing the acute phase of infection.  Positive  results are indicative of active infection with SARS-CoV-2.  Clinical  correlation with patient history and other diagnostic information is  necessary to determine patient infection status.  Positive results do  not rule out bacterial infection or co-infection with other viruses. If result is PRESUMPTIVE POSTIVE SARS-CoV-2 nucleic acids MAY BE PRESENT.   A presumptive positive result was obtained on the submitted specimen  and confirmed on repeat testing.  While 2019 novel coronavirus  (SARS-CoV-2) nucleic acids may be present in the submitted sample  additional confirmatory testing may be necessary for epidemiological  and / or clinical management purposes  to differentiate between   SARS-CoV-2 and other Sarbecovirus currently known to infect humans.  If clinically indicated additional testing with an alternate test  methodology [phone]) is advised. The SARS-CoV-2 RNA is generally  detectable in upper and lower respiratory sp ecimens during the acute  phase of infection. The expected result is Negative. Fact Sheet for Patients:  StrictlyIdeas.no Fact Sheet for Healthcare Providers: BankingDealers.co.za This test is not yet approved or cleared by the [place] FDA and has been authorized for detection and/or diagnosis of SARS-CoV-2 by FDA under an Emergency Use Authorization (EUA).  This EUA will remain in effect (meaning this test can be used) for the duration of the COVID-19 declaration under Section 564(b)(1) of the Act, 21 U.S.C. section 360bbb-3(b)(1), unless the  authorization is terminated or revoked sooner. Performed at [facility], [place] [address], [place], [place] [address]     Imaging: No results found.

## 2019-02-01 NOTE — Evaluation (Signed)
Occupational Therapy Evaluation Patient Details Name: Cole Nelson MRN: [id number] DOB: 12-Aug-1955 Today's Date: 02/01/2019    History of Present Illness 63 year old male with history of ESRD on TTS- iHD via LUE AVF, DM, tobacco abuse, diabetic gastroparesis, GERD, HTN, diastolic HF, multiple sclerosis, CVA w/residual left sided weakness, chronic left pleural effusion, hx cdiff, diabetic neuropathy, anemia, HLD, and depression, was admitted on 7/11 to [facility] with acute shortness of breath and hypertensive urgency.  Found on admit to have complete opacification of left hemithorax. Acute on chronic pleural effusions s/p left VATS, concern for pleural mass vs infectious consolidation   Clinical Impression   Pt admitted with above. He demonstrates the below listed deficits and will benefit from continued OT to maximize safety and independence with BADLs.  Pt presents with impaired cognition, decreased activity tolerance, generalized weakness and decreased balance.  He currently requires min - max A for ADLs.  He lives with wife, and reports he was mod I - min A PTA. If his activity tolerance improves, feel he would benefit from CIR. Will follow acutely.       Follow Up Recommendations  CIR    Equipment Recommendations  3 in 1 bedside commode    Recommendations for Other Services Rehab consult     Precautions / Restrictions Precautions Precautions: Fall      Mobility Bed Mobility Overal bed mobility: Needs Assistance Bed Mobility: Supine to Sit;Sit to Supine     Supine to sit: Mod assist Sit to supine: Mod assist   General bed mobility comments: assist to lift trunk from bed and assist to lift LEs back onto bed   Transfers Overall transfer level: Needs assistance Equipment used: Rolling walker (2 wheeled) Transfers: Sit to/from [facility] Sit to Stand: Mod assist         General transfer comment: assist to power up into standing and assist  to side step up EOB     Balance Overall balance assessment: Needs assistance Sitting-balance support: Feet supported Sitting balance-Leahy Scale: Fair     Standing balance support: Bilateral upper extremity supported Standing balance-Leahy Scale: Poor Standing balance comment: reliant on Bil. UE support                            ADL either performed or assessed with clinical judgement   ADL Overall ADL's : Needs assistance/impaired Eating/Feeding: Modified independent;Bed level   Grooming: Wash/dry hands;Wash/dry face;Oral care;Sitting;Set up   Upper Body Bathing: Set up;Sitting   Lower Body Bathing: Moderate assistance;Sit to/from stand   Upper Body Dressing : Minimal assistance;Sitting   Lower Body Dressing: Moderate assistance;Sit to/from stand Lower Body Dressing Details (indicate cue type and reason): difficulty accessing Rt foot due to fatigue  Toilet Transfer: Moderate assistance;Stand-pivot;BSC;RW   Toileting- Clothing Manipulation and Hygiene: Total assistance;Sit to/from stand Toileting - Clothing Manipulation Details (indicate cue type and reason): assisted with clean up due to incontinence      Functional mobility during ADLs: Moderate assistance;Rolling walker       Vision         Perception     Praxis      Pertinent Vitals/Pain Pain Assessment: No/denies pain     Hand Dominance Right   Extremity/Trunk Assessment Upper Extremity Assessment Upper Extremity Assessment: Generalized weakness;LUE deficits/detail LUE Deficits / Details: Pt with mild weakness Lt UE from previous CVA    Lower Extremity Assessment Lower Extremity Assessment: Defer to PT evaluation  Communication Communication Communication: No difficulties   Cognition Arousal/Alertness: Awake/alert(though a little sleepy) Behavior During Therapy: WFL for tasks assessed/performed Overall Cognitive Status: Impaired/Different from baseline Area of Impairment:  Attention;Safety/judgement;Awareness;Problem solving                   Current Attention Level: Sustained   Following Commands: Follows one step commands with increased time Safety/Judgement: Decreased awareness of safety;Decreased awareness of deficits Awareness: Intellectual Problem Solving: Slow processing;Difficulty sequencing;Requires verbal cues;Requires tactile cues General Comments: Pt incontinent of large amount of stool with no awareness    General Comments       Exercises     Shoulder Instructions      Home Living Family/patient expects to be discharged to:: Private residence Living Arrangements: Spouse/significant other Available Help at Discharge: Family;Available 24 hours/day Type of Home: House Home Access: Stairs to enter [employer] of Steps: 2 Entrance Stairs-Rails: (Don't know) Home Layout: One level     Bathroom Shower/Tub: Walk-in shower(to be determined)         Home Equipment: Walker - 2 wheels;Shower seat          Prior Functioning/Environment Level of Independence: Independent with assistive device(s)        Comments: He does not drive, and takes transportation to HD. Pt reports he is mostly mod I using RW.  Wife has to assist with LB ADLs on occasion when he is fatigued         OT Problem List: Decreased strength;Decreased activity tolerance;Impaired balance (sitting and/or standing);Decreased cognition;Decreased safety awareness;Decreased knowledge of use of DME or AE      OT Treatment/Interventions: Self-care/ADL training;DME and/or AE instruction;Therapeutic activities;Patient/family education;Balance training;Therapeutic exercise;Cognitive remediation/compensation    OT Goals(Current goals can be found in the care plan section) Acute Rehab OT Goals Patient Stated Goal: to get stronger  OT Goal Formulation: With patient Time For Goal Achievement: 02/15/19 Potential to Achieve Goals: Good  OT Frequency: Min  2X/week   Barriers to D/C: Decreased caregiver support          Co-evaluation              AM-PAC OT "6 Clicks" Daily Activity     Outcome Measure Help from another person eating meals?: None Help from another person taking care of personal grooming?: A Lot Help from another person toileting, which includes using toliet, bedpan, or urinal?: A Lot Help from another person bathing (including washing, rinsing, drying)?: A Lot Help from another person to put on and taking off regular upper body clothing?: A Lot Help from another person to put on and taking off regular lower body clothing?: A Lot 6 Click Score: 14   End of Session Equipment Utilized During Treatment: Rolling walker;Gait belt Nurse Communication: Mobility status  Activity Tolerance: Patient limited by fatigue Patient left: in bed;with call bell/phone within reach;with bed alarm set  OT Visit Diagnosis: Unsteadiness on feet (R26.81)                Time: 1709-1730 OT Time Calculation (min): 21 min Charges:  OT General Charges $OT Visit: 1 Visit OT Evaluation $OT Eval Moderate Complexity: 1 Mod  Lucille Passy, OTR/L Acute Rehabilitation Services Pager [phone] Office [phone]   Lucille Passy M 02/01/2019, 7:28 PM

## 2019-02-01 NOTE — Progress Notes (Signed)
PROGRESS NOTE  Cole Nelson UTM:[id number] DOB: 05-29-1956 DOA: 01/28/2019 PCP: Patient, No Pcp Per  HPI/Recap of past 24 hours: HPI from Critical care team 63 year old male with history of ESRD on TTS- iHD via LUE AVF, DM, tobacco abuse, diabetic gastroparesis, GERD, HTN, diastolic HF, multiple sclerosis, CVA w/residual left sided weakness, chronic left pleural effusion, hx cdiff, diabetic neuropathy, anemia, HLD, and depression, was admitted on 7/11 to [facility] with acute shortness of breath and hypertensive urgency.  Found on admit to have complete opacification of left hemithorax.  Previous thoracentesis in September 02 2018 was transudative.  Underwent left thoracentesis on 7/11 with 2300 ml of dark red fluid removed, found to be exudative with cytology negative for neoplasm. Underwent left VATS on 7/13 with placement of chest tube found to have pleural pulsatile masses. CT chest then performed which showed consolidation or mass along the pleural surface of the anterior lateral left upper lobe. The case was discussed with cardiothoracic at [facility] with some question that this was acute consolidation rather than a mass and recommended further outpatient follow up after completion of antibiotic therapy. Patient was continued on levofloxacin.    During his hospitalization, patient with recurrent bouts of nausea and vomiting in which wife had reported that patient has been having 3 to 4 times a month over the last year. CT abdomen obtained showed marked gastric distention with dilation of the 2nd and proximal 3rd portions of the duodenum possible narrowing at the level of the hiatus between the aorta and superior mesenteric artery consistent with SMA syndrome.  A nasal gastric tube was placed, and stomach eventually decompressed. A CT scan here 7/23 now shows that the stomach is decompressed. 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 GI consulted, and recommended an enteroscopy. PCCM consulted [facility], assumed care on 01/30/2019      Today, patient reported feeling tired, denies any worsening SOB, chest pain, abdominal pain, N/V/D, fever/chills.  Assessment/Plan: Active Problems:   Acute encephalopathy   Pressure injury of skin   Pleural effusion   End-stage renal disease on hemodialysis (HCC)   Protein-calorie malnutrition, severe   Nausea and vomiting   Acute gastric erosion   Abnormal CT scan, small bowel  Acute on chronic pleural effusions s/p left VATS, concern for pleural mass vs infectious consolidation Afebrile, with resolved leukocytosis, currently off oxygen BC x2 NGTD Pleural Fluid cytology with no evidence of neoplasia  Continue renally dosed vancomycin, Levaquin Pulmonary Hygiene  Plan is to likely follow-up with cardiothoracic surgeons at [facility] for further management once stable  SBO, 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  Melanosis in the duodenum and jejunum, biopsied.  No evidence of inflammation, stricture, narrowing obstruction in the examined duodenum and proximal jejunum GI recommends gastroparesis diet, metoclopramide, daily PPI  Gastroparesis Continue metoclopramide  HTN/Diastolic HF Continue appears euvolemic Echo with hyperdynamic LVEF  IV labetalol as needed Continue home meds: Amlodipine, Coreg, hydralazine, clonidine  ESRD on TTS HD per nephrology   Anemia of chronic kidney disease No signs of obvious bleeding Daily CBC  Diabetes mellitus type 2 SSI, Accu-Cheks, hypoglycemic protocol Hold home regimen  for now   Multiple sclerosis Stable  GERD Continue PPI        Malnutrition Type:  Nutrition Problem: Severe Malnutrition Etiology: chronic illness(ESRD on HD, CHF)   Malnutrition Characteristics:  Signs/Symptoms: severe muscle depletion, severe fat depletion   Nutrition Interventions:  Interventions: Boost Breeze, Prostat    Estimated body mass index is 19.89 kg/m as calculated from the following:   Height as of this encounter: 5\' 11"  (1.803 m).   Weight as of this encounter: 64.7 kg.     Code Status: Full  Family Communication: Discussed with patient  Disposition Plan: To be determined   Consultants:  PCCM  General surgery  GI  Nephrology  Procedures:  None  Antimicrobials:  Vancomycin  Levaquin  DVT prophylaxis: Heparin   Objective: Vitals:   02/01/19 0401 02/01/19 0804 02/01/19 0821 02/01/19 1128  BP: 137/66  130/66 (!) 151/78  Pulse: 93  91 85  Resp: 20  19 20   Temp: 98.3 F (36.8 C)  98.4 F (36.9 C) 98.6 F (37 C)  TempSrc: Oral  Oral Oral  SpO2: 95% 96% 95% 94%  Weight:      Height:        Intake/Output Summary (Last 24 hours) at 02/01/2019 1418 Last data filed at 02/01/2019 0825 Gross per 24 hour  Intake 562.67 ml  Output 0 ml  Net 562.67 ml   Filed Weights   01/30/19 1740 01/31/19 0300 02/01/19 0225  Weight: 63.8 kg 64.1 kg 64.7 kg    Exam:  General: NAD, cachectic, chronically ill-appearing  Cardiovascular: S1, S2 present  Respiratory: CTAB  Abdomen: Soft, nontender, nondistended, bowel sounds present  Musculoskeletal: No bilateral pedal edema noted  Skin: Normal  Psychiatry: Normal mood   Data Reviewed: CBC: Recent Labs  Lab 01/28/19 0546 01/29/19 0309 01/30/19 0511 01/31/19 0747 02/01/19 0719  WBC 15.5* 15.2* 11.0* 9.9 6.9  NEUTROABS 12.6*  --  8.1* 7.1 4.7  HGB 11.0* 8.2* 8.0* 7.5* 7.3*  HCT 35.0* 25.6* 25.1* 23.7* 23.3*  MCV 78.3* 77.1* 77.7* 79.5* 79.5*  PLT 263 311 PLATELET CLUMPS NOTED ON  SMEAR, COUNT APPEARS ADEQUATE 267 876   Basic Metabolic Panel: Recent Labs  Lab 01/28/19 0546 01/29/19 0309 01/30/19 0511 01/31/19 0747 02/01/19 0719  NA 136 135 133* 133* 130*  K 5.3* 3.1* 3.4* 3.9 4.1  CL 85* 86* 94* 95* 94*  CO2 28 28 24 25 23   GLUCOSE 132* 84 120* 100* 219*  BUN 76* 84* 38* 22 37*  CREATININE 9.24* 10.08* 5.93* 4.09* 5.82*  CALCIUM 9.0 9.0 8.5* 8.7* 8.6*  MG 2.2 2.1  --   --   --   PHOS 8.8* 6.8*  --   --   --    GFR: Estimated Creatinine Clearance: 12 mL/min (A) (by C-G formula based on SCr of 5.82 mg/dL (H)). Liver Function Tests: Recent Labs  Lab 01/28/19 0546 01/30/19 0511  AST 52* 31  ALT 20 14  ALKPHOS 68 62  BILITOT 1.5* 1.2  PROT 7.1 6.7  ALBUMIN 2.7* 2.4*   No results for input(s): LIPASE, AMYLASE in the last 168 hours. No results for input(s): AMMONIA in the last 168 hours. Coagulation Profile: Recent Labs  Lab 01/28/19 0948  INR 1.6*   Cardiac Enzymes: No results for input(s): CKTOTAL, CKMB, CKMBINDEX, TROPONINI in the last 168 hours. BNP (last 3 results) No results for input(s): PROBNP in the last 8760 hours. HbA1C: No results for input(s): HGBA1C in the last 72 hours. CBG:  Recent Labs  Lab 01/31/19 2040 01/31/19 2348 02/01/19 0358 02/01/19 0733 02/01/19 1143  GLUCAP 181* 140* 131* 134* 169*   Lipid Profile: No results for input(s): CHOL, HDL, LDLCALC, TRIG, CHOLHDL, LDLDIRECT in the last 72 hours. Thyroid Function Tests: No results for input(s): TSH, T4TOTAL, FREET4, T3FREE, THYROIDAB in the last 72 hours. Anemia Panel: No results for input(s): VITAMINB12, FOLATE, FERRITIN, TIBC, IRON, RETICCTPCT in the last 72 hours. Urine analysis: No results found for: COLORURINE, APPEARANCEUR, LABSPEC, PHURINE, GLUCOSEU, HGBUR, BILIRUBINUR, KETONESUR, PROTEINUR, UROBILINOGEN, NITRITE, LEUKOCYTESUR Sepsis Labs: @LABRCNTIP (procalcitonin:4,lacticidven:4)  ) Recent Results (from the past 240 hour(s))  MRSA PCR Screening      Status: None   Collection Time: 01/28/19 12:22 AM   Specimen: Nasopharyngeal  Result Value Ref Range Status   MRSA by PCR NEGATIVE NEGATIVE Final    Comment:        The GeneXpert MRSA Assay (FDA approved for NASAL specimens only), is one component of a comprehensive MRSA colonization surveillance program. It is not intended to diagnose MRSA infection nor to guide or monitor treatment for MRSA infections. Performed at [facility], [place] [address], [place], [place] [address]   Culture, blood (routine x 2)     Status: None (Preliminary result)   Collection Time: 01/28/19  3:18 AM   Specimen: BLOOD  Result Value Ref Range Status   Specimen Description BLOOD RIGHT ARM  Final   Special Requests   Final    BOTTLES DRAWN AEROBIC ONLY Blood Culture results may not be optimal due to an inadequate volume of blood received in culture bottles   Culture   Final    NO GROWTH 4 DAYS Performed at [facility], [place] [address] [place], [place] [address]    Report Status PENDING  Incomplete  Culture, blood (routine x 2)     Status: None (Preliminary result)   Collection Time: 01/28/19  3:20 AM   Specimen: BLOOD  Result Value Ref Range Status   Specimen Description BLOOD RIGHT ARM  Final   Special Requests   Final    BOTTLES DRAWN AEROBIC ONLY Blood Culture results may not be optimal due to an inadequate volume of blood received in culture bottles   Culture   Final    NO GROWTH 4 DAYS Performed at [facility], [place] [address], [place], [place] [address]    Report Status PENDING  Incomplete  SARS Coronavirus 2 (CEPHEID - Performed in [place] hospital lab), Hosp Order     Status: None   Collection Time: 01/28/19  8:58 AM   Specimen: Nasopharyngeal Swab  Result Value Ref Range Status   SARS Coronavirus 2 NEGATIVE NEGATIVE Final    Comment: (NOTE) If result is NEGATIVE SARS-CoV-2 target nucleic acids are NOT DETECTED. The SARS-CoV-2 RNA is generally detectable in upper  and lower  respiratory specimens during the acute phase of infection. The lowest  concentration of SARS-CoV-2 viral copies this assay can detect is 250  copies / mL. A negative result does not preclude SARS-CoV-2 infection  and should not be used as the sole basis for treatment or other  patient management decisions.  A negative result may occur with  improper specimen collection / handling, submission of specimen other  than nasopharyngeal swab, presence of viral mutation(s) within the  areas targeted by this assay, and inadequate number of viral copies  (<250 copies / mL). A negative result must be combined with clinical  observations, patient history, and  epidemiological information. If result is POSITIVE SARS-CoV-2 target nucleic acids are DETECTED. The SARS-CoV-2 RNA is generally detectable in upper and lower  respiratory specimens dur ing the acute phase of infection.  Positive  results are indicative of active infection with SARS-CoV-2.  Clinical  correlation with patient history and other diagnostic information is  necessary to determine patient infection status.  Positive results do  not rule out bacterial infection or co-infection with other viruses. If result is PRESUMPTIVE POSTIVE SARS-CoV-2 nucleic acids MAY BE PRESENT.   A presumptive positive result was obtained on the submitted specimen  and confirmed on repeat testing.  While 2019 novel coronavirus  (SARS-CoV-2) nucleic acids may be present in the submitted sample  additional confirmatory testing may be necessary for epidemiological  and / or clinical management purposes  to differentiate between  SARS-CoV-2 and other Sarbecovirus currently known to infect humans.  If clinically indicated additional testing with an alternate test  methodology [phone]) is advised. The SARS-CoV-2 RNA is generally  detectable in upper and lower respiratory sp ecimens during the acute  phase of infection. The expected result is  Negative. Fact Sheet for Patients:  StrictlyIdeas.no Fact Sheet for Healthcare Providers: BankingDealers.co.za This test is not yet approved or cleared by the [place] FDA and has been authorized for detection and/or diagnosis of SARS-CoV-2 by FDA under an Emergency Use Authorization (EUA).  This EUA will remain in effect (meaning this test can be used) for the duration of the COVID-19 declaration under Section 564(b)(1) of the Act, 21 U.S.C. section 360bbb-3(b)(1), unless the authorization is terminated or revoked sooner. Performed at [facility], [place] [address], [place], [place] [address]       Studies: Ct Chest Wo Contrast  Result Date: 02/01/2019 CLINICAL DATA:  Chronic LEFT hydropneumothorax. Thoracentesis 7 05/2019. EXAM: CT CHEST WITHOUT CONTRAST TECHNIQUE: Multidetector CT imaging of the chest was performed following the standard protocol without IV contrast. COMPARISON:  CT abdomen 01/28/2019, chest radiograph 01/28/2019 FINDINGS: Cardiovascular: Coronary artery calcification and aortic atherosclerotic calcification. Mediastinum/Nodes: No axillary lymphadenopathy. No supraclavicular adenopathy. There is nodule enlargement of the thyroid gland which is symmetric side-to-side. No clear mediastinal adenopathy on noncontrast exam. Mediastinal tissue planes are poorly delineated. Lungs/Pleura: Large complex LEFT hydropneumothorax again demonstrated. There is a air-fluid level in the lower lobe with some interspersed gas. Large pneumothorax involving the upper lobe. There is atelectasis of the LEFT upper lobe and LEFT lower lobe. There is pleural thickening along the upper lobe lung surface (image 50/4). In the RIGHT lung there is a small RIGHT effusion with nodular pleuroparenchymal thickening at the RIGHT lung base. Example nodule measuring 10 mm on image 123/4. Small ground-glass density in the RIGHT upper lobe without focality.  Upper Abdomen: Limited view of the liver, kidneys, pancreas are unremarkable. Normal adrenal glands. Musculoskeletal: No aggressive osseous lesion IMPRESSION: 1. Large complex LEFT hydropneumothorax with the majority of the LEFT hemithorax occupied by gas. 2. Severe atelectasis of the LEFT upper lobe and LEFT lower lobe with pleural thickening. 3. Minimal pleural fluid at the LEFT lung base with interspersed gas. 4. No clear mediastinal adenopathy although difficult to evaluate without contrast. 5. Atelectasis and effusion on the RIGHT with peripheral nodularity lower lobe is favored infectious or inflammatory. Recommend attention on follow-up. 6. Enlargement of the thyroid gland is favored benign goiter. Electronically Signed   By: Suzy Bouchard M.D.   On: 02/01/2019 12:10    Scheduled Meds: . amLODipine  5 mg Oral BID  .  aspirin  81 mg Oral Daily  . budesonide  0.25 mg Nebulization BID  . carvedilol  25 mg Oral BID  . Chlorhexidine Gluconate Cloth  6 each Topical Daily  . Chlorhexidine Gluconate Cloth  6 each Topical Q0600  . cloNIDine  0.3 mg Oral BID  . [START ON 02/02/2019] darbepoetin (ARANESP) injection - DIALYSIS  60 mcg Intravenous Q Tue-HD  . feeding supplement  1 Container Oral TID BM  . feeding supplement (PRO-STAT SUGAR FREE 64)  30 mL Oral BID  . ferrous sulfate  325 mg Oral Daily  . FLUoxetine  20 mg Oral Daily  . heparin  5,000 Units Subcutaneous Q8H  . hydrALAZINE  100 mg Oral TID  . insulin aspart  0-9 Units Subcutaneous Q4H  . mouth rinse  15 mL Mouth Rinse BID  . metoCLOPramide (REGLAN) injection  10 mg Intravenous TID AC & HS  . montelukast  10 mg Oral Daily  . multivitamin  1 tablet Oral QHS  . nystatin  5 mL Oral QID  . pantoprazole  40 mg Oral Q0600    Continuous Infusions: . sodium chloride 250 mL (02/01/19 0928)  . levofloxacin (LEVAQUIN) IV 500 mg (02/01/19 0545)  . [START ON 02/02/2019] vancomycin       LOS: 4 days     Alma Friendly, MD [facility]  If 7PM-7AM, please contact night-coverage [email or web address] 02/01/2019, 2:18 PM

## 2019-02-01 NOTE — Progress Notes (Signed)
Patient ID: Cole Nelson, male   DOB: 1956-05-20, 63 y.o.   MRN: [id number] [facility] Progress Note   Assessment/ Plan:   1.  Acute on chronic pleural effusion status post VATS: To follow-up as an outpatient with [facility] cardiothoracic surgery.  Pleural fluid without evidence of neoplasia.  On broad-spectrum antibiotics. 2. ESRD: Continue hemodialysis on a TTS schedule with next dialysis due tomorrow.  Plans noted for possible admission to CIR. 3. Anemia: Low but stable hemoglobin/hematocrit, continue to monitor on ESA/iron. 4. CKD-MBD: Elevated phosphorus levels noted, monitor with consequent labs and resume binders.  Calcium acceptable. 5. 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 6. Hypertension: Blood pressure noted, monitor with hemodialysis/UF.  Subjective:   Reports to be feeling fair, states that his appetite is poor.   Objective:   BP (!) 151/78 (BP Location: Right Arm)   Pulse 85   Temp 98.6 F (37 C) (Oral)   Resp 20   Ht 5\' 11"  (1.803 m)   Wt 64.7 kg   SpO2 94%   BMI 19.89 kg/m   Physical Exam: Gen: Appears comfortable resting in bed, lunch minimally eaten CVS: Pulse regular rhythm, normal rate, S1 and S2 normal Resp: Clear to auscultation, no rales/rhonchi Abd: Soft, flat, nontender Ext: Left upper arm AV fistula with positive thrill.  Labs: BMET Recent Labs  Lab 01/28/19 0546 01/29/19 0309 01/30/19 0511 01/31/19 0747 02/01/19 0719  NA 136 135 133* 133* 130*  K 5.3* 3.1* 3.4* 3.9 4.1  CL 85* 86* 94* 95* 94*  CO2 28 28 24 25 23   GLUCOSE 132* 84 120* 100* 219*  BUN 76* 84* 38* 22 37*  CREATININE 9.24* 10.08* 5.93* 4.09* 5.82*  CALCIUM 9.0 9.0 8.5* 8.7* 8.6*  PHOS 8.8* 6.8*  --   --   --    CBC Recent Labs  Lab 01/28/19 0546 01/29/19 0309 01/30/19 0511 01/31/19 0747 02/01/19 0719  WBC 15.5* 15.2* 11.0* 9.9 6.9  NEUTROABS 12.6*  --  8.1*  7.1 4.7  HGB 11.0* 8.2* 8.0* 7.5* 7.3*  HCT 35.0* 25.6* 25.1* 23.7* 23.3*  MCV 78.3* 77.1* 77.7* 79.5* 79.5*  PLT 263 311 PLATELET CLUMPS NOTED ON SMEAR, COUNT APPEARS ADEQUATE 267 265   Medications:    . amLODipine  5 mg Oral BID  . aspirin  81 mg Oral Daily  . budesonide  0.25 mg Nebulization BID  . carvedilol  25 mg Oral BID  . Chlorhexidine Gluconate Cloth  6 each Topical Daily  . Chlorhexidine Gluconate Cloth  6 each Topical Q0600  . cloNIDine  0.3 mg Oral BID  . feeding supplement  1 Container Oral TID BM  . feeding supplement (PRO-STAT SUGAR FREE 64)  30 mL Oral BID  . ferrous sulfate  325 mg Oral Daily  . FLUoxetine  20 mg Oral Daily  . heparin  5,000 Units Subcutaneous Q8H  . hydrALAZINE  100 mg Oral TID  . insulin aspart  0-9 Units Subcutaneous Q4H  . mouth rinse  15 mL Mouth Rinse BID  . metoCLOPramide (REGLAN) injection  10 mg Intravenous TID AC & HS  . montelukast  10 mg Oral Daily  . multivitamin  1 tablet Oral QHS  . nystatin  5 mL Oral QID  . pantoprazole  40 mg Oral Q0600  . vancomycin variable dose per unstable renal function (pharmacist dosing)   Does not apply See admin instructions   Elmarie Shiley, MD 02/01/2019, 1:40  PM

## 2019-02-01 NOTE — Progress Notes (Signed)
Pharmacy Antibiotic Note  Cole Nelson is a 63 y.o. male admitted on 01/28/2019 with PNA from OSH. Pharmacy has been consulted for levofloxacin and vancomycin dosing.  The patient had a Vancomycin random level obtained on admission which was elevated at 30 mcg/ml. The patient is now s/p HD on 7/24 (3 hr BFR 400) and 7/25 (4 hr BFR 450) with an estimated Vancomycin level of 10-11 mcg/ml.  Plan: - Vancomycin 750 mg x 1 today - Continue with Vancomycin 750 mg/HD-TTS - next dose due on 7/28 - Continue Levofloxacin 500mg  IV q48h  - Will continue to follow HD schedule/duration, culture results, LOT, and antibiotic de-escalation plans   Height: 5\' 11"  (180.3 cm) Weight: 142 lb 10.2 oz (64.7 kg) IBW/kg (Calculated) : 75.3  Temp (24hrs), Avg:98.5 F (36.9 C), Min:97.9 F (36.6 C), Max:99.3 F (37.4 C)  Recent Labs  Lab 01/28/19 0546 01/28/19 0604 01/28/19 0948 01/29/19 0309 01/30/19 0511 01/31/19 0747 02/01/19 0719  WBC 15.5*  --   --  15.2* 11.0* 9.9 6.9  CREATININE 9.24*  --   --  10.08* 5.93* 4.09*  --   LATICACIDVEN  --   --  1.1  --   --   --   --   VANCORANDOM  --  30  --   --   --   --   --     Estimated Creatinine Clearance: 17.1 mL/min (A) (by C-G formula based on SCr of 4.09 mg/dL (H)).    Allergies  Allergen Reactions  . Baclofen     confusion  . Cefepime     Confusion, hives   . Insulins     Patient states his mind started going in and out.   Marland Kitchen Penicillins     Vanc 7/20 (started at OSH)>> - Last dose from OSH - 7/22 @ 0113 - 7/23 VR = 30 mcg/ml - HD sessions: 7/24 (3h BFR 400), 7/25 (4 hr BFR 450) - Doses: None yet this admit LVQ  (unknown start date)>>  7/23 BCx >> ngtd 7/23 MRSA PCR- neg  Thank you for allowing pharmacy to be a part of this patient's care.  Alycia Rossetti, PharmD, BCPS Clinical Pharmacist Clinical phone for 02/01/2019: [phone] 02/01/2019 7:53 AM   **Pharmacist phone directory can now be found on [email or web address] ([email or web address]).  Listed  under [place].

## 2019-02-02 LAB — GLUCOSE, CAPILLARY
Glucose-Capillary: 114 mg/dL — ABNORMAL HIGH (ref 70–99)
Glucose-Capillary: 114 mg/dL — ABNORMAL HIGH (ref 70–99)
Glucose-Capillary: 133 mg/dL — ABNORMAL HIGH (ref 70–99)
Glucose-Capillary: 201 mg/dL — ABNORMAL HIGH (ref 70–99)
Glucose-Capillary: 81 mg/dL (ref 70–99)

## 2019-02-02 LAB — BASIC METABOLIC PANEL
Anion gap: 13 (ref 5–15)
BUN: 49 mg/dL — ABNORMAL HIGH (ref 8–23)
CO2: 22 mmol/L (ref 22–32)
Calcium: 8.9 mg/dL (ref 8.9–10.3)
Chloride: 95 mmol/L — ABNORMAL LOW (ref 98–111)
Creatinine, Ser: 7.03 mg/dL — ABNORMAL HIGH (ref 0.61–1.24)
GFR calc Af Amer: 9 mL/min — ABNORMAL LOW (ref >60)
GFR calc non Af Amer: 8 mL/min — ABNORMAL LOW (ref >60)
Glucose, Bld: 115 mg/dL — ABNORMAL HIGH (ref 70–99)
Potassium: 4.2 mmol/L (ref 3.5–5.1)
Sodium: 130 mmol/L — ABNORMAL LOW (ref 135–145)

## 2019-02-02 LAB — CBC WITH DIFFERENTIAL/PLATELET
Abs Immature Granulocytes: 0.02 10*3/uL (ref 0.00–0.07)
Basophils Absolute: 0.1 10*3/uL (ref 0.0–0.1)
Basophils Relative: 1 %
Eosinophils Absolute: 0.4 10*3/uL (ref 0.0–0.5)
Eosinophils Relative: 8 %
HCT: 23.7 % — ABNORMAL LOW (ref 39.0–52.0)
Hemoglobin: 7.5 g/dL — ABNORMAL LOW (ref 13.0–17.0)
Immature Granulocytes: 0 %
Lymphocytes Relative: 12 %
Lymphs Abs: 0.6 10*3/uL — ABNORMAL LOW (ref 0.7–4.0)
MCH: 24.9 pg — ABNORMAL LOW (ref 26.0–34.0)
MCHC: 31.6 g/dL (ref 30.0–36.0)
MCV: 78.7 fL — ABNORMAL LOW (ref 80.0–100.0)
Monocytes Absolute: 0.6 10*3/uL (ref 0.1–1.0)
Monocytes Relative: 11 %
Neutro Abs: 3.5 10*3/uL (ref 1.7–7.7)
Neutrophils Relative %: 68 %
Platelets: 261 10*3/uL (ref 150–400)
RBC: 3.01 MIL/uL — ABNORMAL LOW (ref 4.22–5.81)
RDW: 14.2 % (ref 11.5–15.5)
WBC: 5.3 10*3/uL (ref 4.0–10.5)
nRBC: 0 % (ref 0.0–0.2)

## 2019-02-02 LAB — CULTURE, BLOOD (ROUTINE X 2)
Culture: NO GROWTH
Culture: NO GROWTH

## 2019-02-02 MED ORDER — VANCOMYCIN HCL IN DEXTROSE 750-5 MG/150ML-% IV SOLN
INTRAVENOUS | Status: AC
  Administered 2019-02-02: 750 mg via INTRAVENOUS
  Filled 2019-02-02: qty 150

## 2019-02-02 MED ORDER — DARBEPOETIN ALFA 60 MCG/0.3ML IJ SOSY
PREFILLED_SYRINGE | INTRAMUSCULAR | Status: AC
  Administered 2019-02-02: 60 ug via INTRAVENOUS
  Filled 2019-02-02: qty 0.3

## 2019-02-02 MED ORDER — LIDOCAINE-PRILOCAINE 2.5-2.5 % EX CREA: 1.0000 "application " | TOPICAL_CREAM | CUTANEOUS | Status: DC | PRN

## 2019-02-02 MED ORDER — SODIUM CHLORIDE 0.9 % IV SOLN: 100.0000 mL | INTRAVENOUS | Status: DC | PRN

## 2019-02-02 MED ORDER — HEPARIN SODIUM (PORCINE) 1000 UNIT/ML DIALYSIS: 1000.0000 [IU] | INTRAMUSCULAR | Status: DC | PRN

## 2019-02-02 MED ORDER — ALTEPLASE 2 MG IJ SOLR: 2.0000 mg | Freq: Once | INTRAMUSCULAR | Status: DC | PRN

## 2019-02-02 MED ORDER — HEPARIN SODIUM (PORCINE) 1000 UNIT/ML DIALYSIS: 40.0000 [IU]/kg | INTRAMUSCULAR | Status: DC | PRN

## 2019-02-02 MED ORDER — PENTAFLUOROPROP-TETRAFLUOROETH EX AERO: 1.0000 "application " | INHALATION_SPRAY | CUTANEOUS | Status: DC | PRN

## 2019-02-02 NOTE — Progress Notes (Signed)
PT Cancellation Note  Patient Details Name: Brazos Sandoval MRN: [id number] DOB: June 30, 1956   Cancelled Treatment:    Reason Eval/Treat Not Completed: Patient at procedure or test/unavailable   Currently in HD;  Will follow up later today as time allows;  Otherwise, will follow up for PT tomorrow;   Thank you,  [name], PT  Acute Rehabilitation Services Pager [phone] Office [phone]     [name] 02/02/2019, 8:27 AM

## 2019-02-02 NOTE — Plan of Care (Signed)
  Problem: Education: Goal: Knowledge of General Education information will improve Description Including pain rating scale, medication(s)/side effects and non-pharmacologic comfort measures Outcome: Progressing   

## 2019-02-02 NOTE — Progress Notes (Signed)
PROGRESS NOTE  Cole Nelson XTA:[id number] DOB: Dec 03, 1955 DOA: 01/28/2019 PCP: Patient, No Pcp Per  HPI/Recap of past 24 hours: HPI from Critical care team 63 year old male with history of ESRD on TTS- iHD via LUE AVF, DM, tobacco abuse, diabetic gastroparesis, GERD, HTN, diastolic HF, multiple sclerosis, CVA w/residual left sided weakness, chronic left pleural effusion, hx cdiff, diabetic neuropathy, anemia, HLD, and depression, was admitted on 7/11 to [facility] with acute shortness of breath and hypertensive urgency.  Found on admit to have complete opacification of left hemithorax.  Previous thoracentesis in September 02 2018 was transudative.  Underwent left thoracentesis on 7/11 with 2300 ml of dark red fluid removed, found to be exudative with cytology negative for neoplasm. Underwent left VATS on 7/13 with placement of chest tube found to have pleural pulsatile masses. CT chest then performed which showed consolidation or mass along the pleural surface of the anterior lateral left upper lobe. The case was discussed with cardiothoracic at [facility] with some question that this was acute consolidation rather than a mass and recommended further outpatient follow up after completion of antibiotic therapy. Patient was continued on levofloxacin.    During his hospitalization, patient with recurrent bouts of nausea and vomiting in which wife had reported that patient has been having 3 to 4 times a month over the last year. CT abdomen obtained showed marked gastric distention with dilation of the 2nd and proximal 3rd portions of the duodenum possible narrowing at the level of the hiatus between the aorta and superior mesenteric artery consistent with SMA syndrome.  A nasal gastric tube was placed, and stomach eventually decompressed. A CT scan here 7/23 now shows that the stomach is decompressed. 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 GI consulted, and recommended an enteroscopy. PCCM consulted [facility], assumed care on 01/30/2019      Today, met patient resting comfortably, no respiratory distress noted at all.  Patient denies any fever/chills, chest pain, shortness of breath, nausea/vomiting, abdominal pain.  Assessment/Plan: Active Problems:   Acute encephalopathy   Pressure injury of skin   Pleural effusion   End-stage renal disease on hemodialysis (HCC)   Protein-calorie malnutrition, severe   Nausea and vomiting   Acute gastric erosion   Abnormal CT scan, small bowel  Acute on chronic pleural effusions s/p left VATS, concern for pleural mass vs infectious consolidation Afebrile, with resolved leukocytosis, currently off oxygen BC x2 NGTD Pleural Fluid cytology with no evidence of neoplasia Repeat CT chest without contrast for follow-up showed chronic large complex left hydropneumothorax (currently in no respiratory distress, off oxygen saturating well) Continue renally dosed vancomycin, Levaquin for total of 7 days and reevaluate Pulmonary Hygiene  Plan is to likely follow-up with [facility]othoracic surgeons at 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  Melanosis in the duodenum and jejunum, biopsied.  No evidence of inflammation, stricture, narrowing obstruction in the examined duodenum and proximal jejunum GI recommends gastroparesis diet, metoclopramide, daily PPI  Gastroparesis Continue metoclopramide  HTN/Diastolic HF Continue appears euvolemic Echo with hyperdynamic LVEF  IV labetalol as needed Continue  home  meds: Amlodipine, Coreg, hydralazine, clonidine  ESRD on TTS HD per nephrology   Anemia of chronic kidney disease No signs of obvious bleeding Daily CBC  Diabetes mellitus type 2 SSI, Accu-Cheks, hypoglycemic protocol Hold home regimen for now  Multiple sclerosis Stable  GERD Continue PPI        Malnutrition Type:  Nutrition Problem: Severe Malnutrition Etiology: chronic illness(ESRD on HD, CHF)   Malnutrition Characteristics:  Signs/Symptoms: severe muscle depletion, severe fat depletion   Nutrition Interventions:  Interventions: Boost Breeze, Prostat    Estimated body mass index is 19.25 kg/m as calculated from the following:   Height as of this encounter: '5\' 11"'$  (1.803 m).   Weight as of this encounter: 62.6 kg.     Code Status: Full  Family Communication: Discussed with patient  Disposition Plan: To be determined   Consultants:  PCCM  General surgery  GI  Nephrology  Procedures:  None  Antimicrobials:  Vancomycin  Levaquin  DVT prophylaxis: Heparin   Objective: Vitals:   02/02/19 1030 02/02/19 1059 02/02/19 1217 02/02/19 1600  BP: 106/63 (!) 152/57 (!) 145/66 (!) 119/59  Pulse: 95 93 89 81  Resp:  '18 20 18  '$ Temp:  97.7 F (36.5 C) 98.3 F (36.8 C) 98.6 F (37 C)  TempSrc:  Oral Oral Oral  SpO2:  98% 93% 96%  Weight:  62.6 kg    Height:        Intake/Output Summary (Last 24 hours) at 02/02/2019 1608 Last data filed at 02/02/2019 1434 Gross per 24 hour  Intake 662.03 ml  Output 2078 ml  Net -1415.97 ml   Filed Weights   02/02/19 0429 02/02/19 0750 02/02/19 1059  Weight: 63.9 kg 65.6 kg 62.6 kg    Exam:  General: NAD, cachectic, chronically ill-appearing  Cardiovascular: S1, S2 present  Respiratory:  Diminished breath sounds on the left  Abdomen: Soft, nontender, nondistended, bowel sounds present  Musculoskeletal: No bilateral pedal edema noted  Skin: Normal  Psychiatry: Normal mood   Data  Reviewed: CBC: Recent Labs  Lab 01/28/19 0546 01/29/19 0309 01/30/19 0511 01/31/19 0747 02/01/19 0719 02/02/19 0555  WBC 15.5* 15.2* 11.0* 9.9 6.9 5.3  NEUTROABS 12.6*  --  8.1* 7.1 4.7 3.5  HGB 11.0* 8.2* 8.0* 7.5* 7.3* 7.5*  HCT 35.0* 25.6* 25.1* 23.7* 23.3* 23.7*  MCV 78.3* 77.1* 77.7* 79.5* 79.5* 78.7*  PLT 263 311 PLATELET CLUMPS NOTED ON SMEAR, COUNT APPEARS ADEQUATE 267 265 267   Basic Metabolic Panel: Recent Labs  Lab 01/28/19 0546 01/29/19 0309 01/30/19 0511 01/31/19 0747 02/01/19 0719 02/02/19 0555  NA 136 135 133* 133* 130* 130*  K 5.3* 3.1* 3.4* 3.9 4.1 4.2  CL 85* 86* 94* 95* 94* 95*  CO2 '28 28 24 25 23 22  '$ GLUCOSE 132* 84 120* 100* 219* 115*  BUN 76* 84* 38* 22 37* 49*  CREATININE 9.24* 10.08* 5.93* 4.09* 5.82* 7.03*  CALCIUM 9.0 9.0 8.5* 8.7* 8.6* 8.9  MG 2.2 2.1  --   --   --   --   PHOS 8.8* 6.8*  --   --   --   --    GFR: Estimated Creatinine Clearance: 9.6 mL/min (A) (by C-G formula based on SCr of 7.03 mg/dL (H)). Liver Function Tests: Recent Labs  Lab 01/28/19 0546 01/30/19 0511  AST 52* 31  ALT 20 14  ALKPHOS 68 62  BILITOT 1.5* 1.2  PROT 7.1 6.7  ALBUMIN 2.7* 2.4*   No results for input(s): LIPASE,  AMYLASE in the last 168 hours. No results for input(s): AMMONIA in the last 168 hours. Coagulation Profile: Recent Labs  Lab 01/28/19 0948  INR 1.6*   Cardiac Enzymes: No results for input(s): CKTOTAL, CKMB, CKMBINDEX, TROPONINI in the last 168 hours. BNP (last 3 results) No results for input(s): PROBNP in the last 8760 hours. HbA1C: No results for input(s): HGBA1C in the last 72 hours. CBG: Recent Labs  Lab 02/01/19 2137 02/01/19 2348 02/02/19 0408 02/02/19 0725 02/02/19 1219  GLUCAP 127* 164* 114* 114* 133*   Lipid Profile: No results for input(s): CHOL, HDL, LDLCALC, TRIG, CHOLHDL, LDLDIRECT in the last 72 hours. Thyroid Function Tests: No results for input(s): TSH, T4TOTAL, FREET4, T3FREE, THYROIDAB in the last 72  hours. Anemia Panel: No results for input(s): VITAMINB12, FOLATE, FERRITIN, TIBC, IRON, RETICCTPCT in the last 72 hours. Urine analysis: No results found for: COLORURINE, APPEARANCEUR, LABSPEC, PHURINE, GLUCOSEU, HGBUR, BILIRUBINUR, KETONESUR, PROTEINUR, UROBILINOGEN, NITRITE, LEUKOCYTESUR Sepsis Labs: '@LABRCNTIP'$ (procalcitonin:4,lacticidven:4)  ) Recent Results (from the past 240 hour(s))  MRSA PCR Screening     Status: None   Collection Time: 01/28/19 12:22 AM   Specimen: Nasopharyngeal  Result Value Ref Range Status   MRSA by PCR NEGATIVE NEGATIVE Final    Comment:        The GeneXpert MRSA Assay (FDA approved for NASAL specimens only), is one component of a comprehensive MRSA colonization surveillance program. It is not intended to diagnose MRSA infection nor to guide or monitor treatment for MRSA infections. Performed at [facility], [place] [address] [place], [place] [address]   Culture, blood (routine x 2)     Status: None   Collection Time: 01/28/19  3:18 AM   Specimen: BLOOD  Result Value Ref Range Status   Specimen Description BLOOD RIGHT ARM  Final   Special Requests   Final    BOTTLES DRAWN AEROBIC ONLY Blood Culture results may not be optimal due to an inadequate volume of blood received in culture bottles   Culture   Final    NO GROWTH 5 DAYS Performed at [facility], [place] [address], [place], [place] [address]    Report Status 02/02/2019 FINAL  Final  Culture, blood (routine x 2)     Status: None   Collection Time: 01/28/19  3:20 AM   Specimen: BLOOD  Result Value Ref Range Status   Specimen Description BLOOD RIGHT ARM  Final   Special Requests   Final    BOTTLES DRAWN AEROBIC ONLY Blood Culture results may not be optimal due to an inadequate volume of blood received in culture bottles   Culture   Final    NO GROWTH 5 DAYS Performed at [facility], [place] [address], [place], [place] [address]    Report Status 02/02/2019 FINAL  Final   SARS Coronavirus 2 (CEPHEID - Performed in [address] hospital lab), Hosp Order     Status: None   Collection Time: 01/28/19  8:58 AM   Specimen: Nasopharyngeal Swab  Result Value Ref Range Status   SARS Coronavirus 2 NEGATIVE NEGATIVE Final    Comment: (NOTE) If result is NEGATIVE SARS-CoV-2 target nucleic acids are NOT DETECTED. The SARS-CoV-2 RNA is generally detectable in upper and lower  respiratory specimens during the acute phase of infection. The lowest  concentration of SARS-CoV-2 viral copies this assay can detect is 250  copies / mL. A negative result does not preclude SARS-CoV-2 infection  and should not be used as  the sole basis for treatment or other  patient management decisions.  A negative result may occur with  improper specimen collection / handling, submission of specimen other  than nasopharyngeal swab, presence of viral mutation(s) within the  areas targeted by this assay, and inadequate number of viral copies  (<250 copies / mL). A negative result must be combined with clinical  observations, patient history, and epidemiological information. If result is POSITIVE SARS-CoV-2 target nucleic acids are DETECTED. The SARS-CoV-2 RNA is generally detectable in upper and lower  respiratory specimens dur ing the acute phase of infection.  Positive  results are indicative of active infection with SARS-CoV-2.  Clinical  correlation with patient history and other diagnostic information is  necessary to determine patient infection status.  Positive results do  not rule out bacterial infection or co-infection with other viruses. If result is PRESUMPTIVE POSTIVE SARS-CoV-2 nucleic acids MAY BE PRESENT.   A presumptive positive result was obtained on the submitted specimen  and confirmed on repeat testing.  While 2019 novel coronavirus  (SARS-CoV-2) nucleic acids may be present in the submitted sample  additional confirmatory testing may be necessary for epidemiological   and / or clinical management purposes  to differentiate between  SARS-CoV-2 and other Sarbecovirus currently known to infect humans.  If clinically indicated additional testing with an alternate test  methodology [phone]) is advised. The SARS-CoV-2 RNA is generally  detectable in upper and lower respiratory sp ecimens during the acute  phase of infection. The expected result is Negative. Fact Sheet for Patients:  StrictlyIdeas.no Fact Sheet for Healthcare Providers: BankingDealers.co.za This test is not yet approved or cleared by the [place] FDA and has been authorized for detection and/or diagnosis of SARS-CoV-2 by FDA under an Emergency Use Authorization (EUA).  This EUA will remain in effect (meaning this test can be used) for the duration of the COVID-19 declaration under Section 564(b)(1) of the Act, 21 U.S.C. section 360bbb-3(b)(1), unless the authorization is terminated or revoked sooner. Performed at [facility], [place] [address], [place], [place] [address]       Studies: No results found.  Scheduled Meds: . amLODipine  5 mg Oral BID  . aspirin  81 mg Oral Daily  . budesonide  0.25 mg Nebulization BID  . carvedilol  25 mg Oral BID  . Chlorhexidine Gluconate Cloth  6 each Topical Daily  . Chlorhexidine Gluconate Cloth  6 each Topical Q0600  . cloNIDine  0.3 mg Oral BID  . darbepoetin (ARANESP) injection - DIALYSIS  60 mcg Intravenous Q Tue-HD  . feeding supplement  1 Container Oral TID BM  . feeding supplement (PRO-STAT SUGAR FREE 64)  30 mL Oral BID  . ferrous sulfate  325 mg Oral Daily  . FLUoxetine  20 mg Oral Daily  . heparin  5,000 Units Subcutaneous Q8H  . hydrALAZINE  100 mg Oral TID  . insulin aspart  0-9 Units Subcutaneous Q4H  . mouth rinse  15 mL Mouth Rinse BID  . metoCLOPramide (REGLAN) injection  10 mg Intravenous TID AC & HS  . montelukast  10 mg Oral Daily  . multivitamin  1 tablet Oral QHS   . nystatin  5 mL Oral QID  . pantoprazole  40 mg Oral Q0600    Continuous Infusions: . sodium chloride 250 mL (02/01/19 0928)  . levofloxacin (LEVAQUIN) IV 500 mg (02/01/19 0545)  . vancomycin Stopped (02/02/19 1150)     LOS: 5 days  Alma Friendly, MD [facility]  If 7PM-7AM, please contact night-coverage [email or web address] 02/02/2019, 4:08 PM

## 2019-02-02 NOTE — Progress Notes (Signed)
Physical Therapy Treatment Patient Details Name: Amond Speranza MRN: [id number] DOB: 1956/04/17 Today's Date: 02/02/2019    History of Present Illness 63 year old male with history of ESRD on TTS- iHD via LUE AVF, DM, tobacco abuse, diabetic gastroparesis, GERD, HTN, diastolic HF, multiple sclerosis, CVA w/residual left sided weakness, chronic left pleural effusion, hx cdiff, diabetic neuropathy, anemia, HLD, and depression, was admitted on 7/11 to [facility] with acute shortness of breath and hypertensive urgency.  Found on admit to have complete opacification of left hemithorax. Acute on chronic pleural effusions s/p left VATS, concern for pleural mass vs infectious consolidation    PT Comments    Continuing work on functional mobility and activity tolerance;  Very motivated to get better, and able to get up and walk with RW and heavy moderate assist for RW management and stability; Decr L step length is setting him up for big anterior losses of balance; Tells me he does have a hsitor of falls;   He participated well with OOB activity and walking; He is so motivated to get home; At this point, I recommend CIR for intensive therapies to maximize independence and safety with mobility and ADLs, and decr risk of falls.  Follow Up Recommendations  CIR     Equipment Recommendations  Rolling walker with 5" wheels;3in1 (PT)(he may already have)    Recommendations for Other Services Rehab consult     Precautions / Restrictions Precautions Precautions: Fall Restrictions Weight Bearing Restrictions: No    Mobility  Bed Mobility Overal bed mobility: Needs Assistance Bed Mobility: Supine to Sit     Supine to sit: Mod assist     General bed mobility comments: Light moderate handheld assist to pull to sit; slow moving  Transfers Overall transfer level: Needs assistance Equipment used: Rolling walker (2 wheeled) Transfers: Sit to/from Stand Sit to Stand: Mod assist          General transfer comment: assist to power up into standing and assist to side step up EOB   Ambulation/Gait Ambulation/Gait assistance: Mod assist Gait Distance (Feet): 18 Feet Assistive device: Rolling walker (2 wheeled) Gait Pattern/deviations: Step-through pattern;Narrow base of support;Trunk flexed;Decreased step length - left     General Gait Details: heavy dependence on RW for stability; noting difficulty advancing LLE, resulting in center of mass translating too far infront of base of support; Couple that with lack of control of RW, and we have a very high fall risk; Heavy moderate assist to control RW; near constant cues for better L stepping; still, no buckling of knee noted   Stairs             Wheelchair Mobility    Modified Rankin (Stroke Patients Only)       Balance     Sitting balance-Leahy Scale: Fair       Standing balance-Leahy Scale: Poor Standing balance comment: reliant on Bil. UE support                             Cognition Arousal/Alertness: Awake/alert Behavior During Therapy: WFL for tasks assessed/performed Overall Cognitive Status: Impaired/Different from baseline                           Safety/Judgement: Decreased awareness of safety;Decreased awareness of deficits Awareness: Intellectual Problem Solving: Slow processing;Difficulty sequencing;Requires verbal cues;Requires tactile cues        Exercises  General Comments        Pertinent Vitals/Pain Pain Assessment: No/denies pain    Home Living                      Prior Function            PT Goals (current goals can now be found in the care plan section) Acute Rehab PT Goals Patient Stated Goal: to get stronger  PT Goal Formulation: With patient Time For Goal Achievement: 02/14/19 Potential to Achieve Goals: Good Progress towards PT goals: Progressing toward goals    Frequency    Min 3X/week      PT Plan Discharge  plan needs to be updated    Co-evaluation              AM-PAC PT "6 Clicks" Mobility   Outcome Measure  Help needed turning from your back to your side while in a flat bed without using bedrails?: A Little Help needed moving from lying on your back to sitting on the side of a flat bed without using bedrails?: A Little Help needed moving to and from a bed to a chair (including a wheelchair)?: A Lot Help needed standing up from a chair using your arms (e.g., wheelchair or bedside chair)?: A Lot Help needed to walk in hospital room?: A Lot Help needed climbing 3-5 steps with a railing? : A Lot 6 Click Score: 14    End of Session Equipment Utilized During Treatment: Gait belt Activity Tolerance: Patient tolerated treatment well Patient left: in chair;with call bell/phone within reach;with chair alarm set Nurse Communication: Mobility status PT Visit Diagnosis: Other abnormalities of gait and mobility (R26.89);Muscle weakness (generalized) (M62.81)     Time: 0940-7680 PT Time Calculation (min) (ACUTE ONLY): 17 min  Charges:  $Gait Training: 8-22 mins                     [name], [place]  Acute Rehabilitation Services Pager [phone] Office [place] 02/02/2019, 3:20 PM

## 2019-02-02 NOTE — Procedures (Signed)
Patient seen on Hemodialysis. He states that he had an uneventful night and tolerated his supper last night without postprandial nausea or vomiting.  BP (!) 111/51   Pulse 84   Temp 97.6 F (36.4 C) (Oral)   Resp 18   Ht 5\' 11"  (1.803 m)   Wt 65.6 kg   SpO2 98%   BMI 20.17 kg/m   QB 450, UF goal 2L Tolerating treatment without complaints at this time.  Awaiting decision on disposition-inpatient rehabilitation versus outpatient (patient unaware of status).  Elmarie Shiley MD [facility]. Office # [phone] Pager # [phone] 9:13 AM

## 2019-02-02 NOTE — Plan of Care (Signed)
°  Problem: Coping: °Goal: Level of anxiety will decrease °Outcome: Progressing °  °

## 2019-02-03 ENCOUNTER — Encounter: Payer: Self-pay | Admitting: Internal Medicine

## 2019-02-03 LAB — BASIC METABOLIC PANEL
Anion gap: 12 (ref 5–15)
BUN: 29 mg/dL — ABNORMAL HIGH (ref 8–23)
CO2: 25 mmol/L (ref 22–32)
Calcium: 8.8 mg/dL — ABNORMAL LOW (ref 8.9–10.3)
Chloride: 96 mmol/L — ABNORMAL LOW (ref 98–111)
Creatinine, Ser: 4.87 mg/dL — ABNORMAL HIGH (ref 0.61–1.24)
GFR calc Af Amer: 14 mL/min — ABNORMAL LOW (ref >60)
GFR calc non Af Amer: 12 mL/min — ABNORMAL LOW (ref >60)
Glucose, Bld: 95 mg/dL (ref 70–99)
Potassium: 3.7 mmol/L (ref 3.5–5.1)
Sodium: 133 mmol/L — ABNORMAL LOW (ref 135–145)

## 2019-02-03 LAB — GLUCOSE, CAPILLARY
Glucose-Capillary: 126 mg/dL — ABNORMAL HIGH (ref 70–99)
Glucose-Capillary: 130 mg/dL — ABNORMAL HIGH (ref 70–99)
Glucose-Capillary: 151 mg/dL — ABNORMAL HIGH (ref 70–99)
Glucose-Capillary: 234 mg/dL — ABNORMAL HIGH (ref 70–99)
Glucose-Capillary: 252 mg/dL — ABNORMAL HIGH (ref 70–99)
Glucose-Capillary: 69 mg/dL — ABNORMAL LOW (ref 70–99)
Glucose-Capillary: 74 mg/dL (ref 70–99)
Glucose-Capillary: 94 mg/dL (ref 70–99)

## 2019-02-03 LAB — CBC WITH DIFFERENTIAL/PLATELET
Abs Immature Granulocytes: 0.03 10*3/uL (ref 0.00–0.07)
Basophils Absolute: 0.1 10*3/uL (ref 0.0–0.1)
Basophils Relative: 1 %
Eosinophils Absolute: 0.3 10*3/uL (ref 0.0–0.5)
Eosinophils Relative: 6 %
HCT: 22.8 % — ABNORMAL LOW (ref 39.0–52.0)
Hemoglobin: 7 g/dL — ABNORMAL LOW (ref 13.0–17.0)
Immature Granulocytes: 1 %
Lymphocytes Relative: 12 %
Lymphs Abs: 0.7 10*3/uL (ref 0.7–4.0)
MCH: 24.9 pg — ABNORMAL LOW (ref 26.0–34.0)
MCHC: 30.7 g/dL (ref 30.0–36.0)
MCV: 81.1 fL (ref 80.0–100.0)
Monocytes Absolute: 1.1 10*3/uL — ABNORMAL HIGH (ref 0.1–1.0)
Monocytes Relative: 18 %
Neutro Abs: 3.7 10*3/uL (ref 1.7–7.7)
Neutrophils Relative %: 62 %
Platelets: 250 10*3/uL (ref 150–400)
RBC: 2.81 MIL/uL — ABNORMAL LOW (ref 4.22–5.81)
RDW: 14.4 % (ref 11.5–15.5)
WBC: 5.9 10*3/uL (ref 4.0–10.5)
nRBC: 0 % (ref 0.0–0.2)

## 2019-02-03 LAB — ABO/RH: ABO/RH(D): A POS

## 2019-02-03 LAB — IRON AND TIBC
Iron: 75 ug/dL (ref 45–182)
Saturation Ratios: 44 % — ABNORMAL HIGH (ref 17.9–39.5)
TIBC: 169 ug/dL — ABNORMAL LOW (ref 250–450)
UIBC: 94 ug/dL

## 2019-02-03 LAB — FERRITIN: Ferritin: 1429 ng/mL — ABNORMAL HIGH (ref 24–336)

## 2019-02-03 LAB — PHOSPHORUS: Phosphorus: 3.4 mg/dL (ref 2.5–4.6)

## 2019-02-03 LAB — PREPARE RBC (CROSSMATCH)

## 2019-02-03 MED ORDER — DEXTROSE 50 % IV SOLN
INTRAVENOUS | Status: AC
  Filled 2019-02-03: qty 50

## 2019-02-03 MED ORDER — SODIUM CHLORIDE 0.9% IV SOLUTION
Freq: Once | INTRAVENOUS | Status: AC
  Administered 2019-02-04: 11:00:00 via INTRAVENOUS

## 2019-02-03 MED ORDER — GLUCOSE 40 % PO GEL
1.0000 | ORAL | Status: AC
  Administered 2019-02-03: 37.5 g via ORAL
  Filled 2019-02-03: qty 1

## 2019-02-03 NOTE — Progress Notes (Signed)
PROGRESS NOTE    Cole Nelson  QAS:[id number] DOB: [birth year] DOA: 01/28/2019 PCP: Patient, No Pcp Per   Brief Narrative:  Patient is a 63 year old male with history of ESRD on dialysis, diabetes mellitus, tobacco abuse, diabetes gastroparesis, GERD, hypertension, diastolic heart failure, multiple sclerosis, CVA with residual left-sided weakness, chronic left pleural effusion, chronic anemia who was admitted on 7/11 at [facility] with acute shortness of breath and hypertensive urgency.  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  Patient has been transferred to hospital service.  Currently he is hemodynamically stable.  He is being dialysed and is on antibiotics.  PT/OT evaluated him and recommended CIR on discharge.  Assessment & Plan:   Active Problems:   Acute encephalopathy   Pressure injury of skin   Pleural effusion   End-stage renal disease on hemodialysis (HCC)   Protein-calorie malnutrition, severe   Nausea and vomiting   Acute gastric erosion   Abnormal CT scan, small bowel   Acute on chronic pleural effusion: Status post left VATS.  Concern for pleural mass versus infectious consolidation.  Currently he is hemodynamically stable.  Not septic.  Currently on room air.  Blood cultures no growth till date.  Pleural fluid cytology did not show any neoplasia.Repeat CT chest without contrast for follow-up showed chronic large complex left hydropneumothorax (currently in no respiratory distress, off oxygen saturating well). Continue renally dosed vancomycin, Levaquin for total of 7 days and reevaluate.Last day will be tomorrow. The case was discussed with cardiothoracic at [facility] and felt that this is acute consolidation rather than a mass and recommended further outpatient follow up after completion of antibiotic therapy.  Plan is to follow-up with  cardiothoracic surgeons at [facility] for further management once discharged.  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  Tolerating diet.  CT showed no clear evidence of SMA syndrome.  General surgery was following.  GI was also following and underwent enteroscopy on 01/31/2019 which showed food residue in the stomach consistent with gastroparesis, gastric erosions, status post biopsy.  Neurology in the gentleman jejunum which were also biopsied.  No evidence of inflammation, stricture.  GI recommended gastroparesis diet, Reglan, daily PPI.  ESRD on dialysis: Dialyzed on TTS.  Nephrology following.  Hypertension/diastolic heart failure: Currently euvolemic.  Echo showed hyperdynamic left ventricular ejection fraction.  Blood pressure stable.  Continue his home regimen  Anemia of chronic disease: Hemoglobin this morning 7.  He will be transfused with 1 unit of PRBC during dialysis tomorrow.  Diabetes type 2: Continue sliding scale insulin.  Home regimen on hold.  Multiple sclerosis: Stable  GERD: Continue PPI  Debility/Deconditioning: Patient evaluated PT and OT and recommended CIR on discharge.  CIR consulted.   Nutrition Problem: Severe Malnutrition Etiology: chronic illness(ESRD on HD, CHF)      DVT prophylaxis: heparin Rains Code Status: Full Family Communication: None Disposition Plan: CIR as soon as bed is available   Consultants: PCCM, nephrology, GI  Procedures: Hemodialysis, enteroscopy  Antimicrobials:  Anti-infectives (From admission, onward)   Start     Dose/Rate Route Frequency Ordered Stop   02/02/19 1200  vancomycin (VANCOCIN) IVPB 750 mg/150 ml premix     750 mg 150 mL/hr over 60 Minutes Intravenous Every T-Th-Sa (Hemodialysis) 01/31/19 0846     02/01/19 0800  vancomycin (VANCOCIN) IVPB 750 mg/150 ml premix     750 mg 150  mL/hr over 60 Minutes Intravenous  Once 02/01/19 0753 02/01/19 1030   01/28/19 0330  levofloxacin (LEVAQUIN)  IVPB 500 mg     500 mg 100 mL/hr over 60 Minutes Intravenous Every 48 hours 01/28/19 0323     01/28/19 0323  vancomycin variable dose per unstable renal function (pharmacist dosing)  Status:  Discontinued      Does not apply See admin instructions 01/28/19 0323 02/01/19 1400      Subjective:  Patient seen and examined at the bedside this morning.  Hemodynamically stable.  Comfortable.  Currently on room air.  Denies any shortness of breath.  Looks weak and debilitated  Objective: Vitals:   02/02/19 2051 02/03/19 0440 02/03/19 0719 02/03/19 0941  BP:  (!) 146/67  132/75  Pulse:  80 72 86  Resp:  17 16 18   Temp:  98.3 F (36.8 C)  98.4 F (36.9 C)  TempSrc:    Oral  SpO2: 94% 94% 94% 94%  Weight:      Height:        Intake/Output Summary (Last 24 hours) at 02/03/2019 1044 Last data filed at 02/03/2019 0600 Gross per 24 hour  Intake 687.92 ml  Output 2078 ml  Net -1390.08 ml   Filed Weights   02/02/19 0750 02/02/19 1059 02/02/19 2015  Weight: 65.6 kg 62.6 kg 62.6 kg    Examination:  General exam: Deconditioned/debilitated, generalized weakness  HEENT:PERRL,Oral mucosa moist, Ear/Nose normal on gross exam Respiratory system: Decreased air entry on the left side  cardiovascular system: S1 & S2 heard, RRR. No JVD, murmurs, rubs, gallops or clicks. No pedal edema. Gastrointestinal system: Abdomen is nondistended, soft and nontender. No organomegaly or masses felt. Normal bowel sounds heard. Central nervous system: Alert and oriented. No focal neurological deficits. Extremities: No edema, no clubbing ,no cyanosis, distal peripheral pulses palpable.  AV graft on the Skin: No rashes, lesions or ulcers,no icterus ,no pallor MSK: Muscle wasting    Data Reviewed: I have personally reviewed following labs and imaging studies  CBC: Recent Labs  Lab 01/30/19 0511 01/31/19 0747 02/01/19 0719 02/02/19 0555 02/03/19 0535  WBC 11.0* 9.9 6.9 5.3 5.9  NEUTROABS 8.1* 7.1 4.7  3.5 3.7  HGB 8.0* 7.5* 7.3* 7.5* 7.0*  HCT 25.1* 23.7* 23.3* 23.7* 22.8*  MCV 77.7* 79.5* 79.5* 78.7* 81.1  PLT PLATELET CLUMPS NOTED ON SMEAR, COUNT APPEARS ADEQUATE 267 265 261 161   Basic Metabolic Panel: Recent Labs  Lab 01/28/19 0546 01/29/19 0309 01/30/19 0511 01/31/19 0747 02/01/19 0719 02/02/19 0555 02/03/19 0535  NA 136 135 133* 133* 130* 130* 133*  K 5.3* 3.1* 3.4* 3.9 4.1 4.2 3.7  CL 85* 86* 94* 95* 94* 95* 96*  CO2 28 28 24 25 23 22 25   GLUCOSE 132* 84 120* 100* 219* 115* 95  BUN 76* 84* 38* 22 37* 49* 29*  CREATININE 9.24* 10.08* 5.93* 4.09* 5.82* 7.03* 4.87*  CALCIUM 9.0 9.0 8.5* 8.7* 8.6* 8.9 8.8*  MG 2.2 2.1  --   --   --   --   --   PHOS 8.8* 6.8*  --   --   --   --   --    GFR: Estimated Creatinine Clearance: 13.9 mL/min (A) (by C-G formula based on SCr of 4.87 mg/dL (H)). Liver Function Tests: Recent Labs  Lab 01/28/19 0546 01/30/19 0511  AST 52* 31  ALT 20 14  ALKPHOS 68 62  BILITOT 1.5* 1.2  PROT 7.1 6.7  ALBUMIN 2.7* 2.4*  No results for input(s): LIPASE, AMYLASE in the last 168 hours. No results for input(s): AMMONIA in the last 168 hours. Coagulation Profile: Recent Labs  Lab 01/28/19 0948  INR 1.6*   Cardiac Enzymes: No results for input(s): CKTOTAL, CKMB, CKMBINDEX, TROPONINI in the last 168 hours. BNP (last 3 results) No results for input(s): PROBNP in the last 8760 hours. HbA1C: No results for input(s): HGBA1C in the last 72 hours. CBG: Recent Labs  Lab 02/02/19 1727 02/02/19 2015 02/03/19 0008 02/03/19 0440 02/03/19 0722  GLUCAP 201* 81 252* 74 126*   Lipid Profile: No results for input(s): CHOL, HDL, LDLCALC, TRIG, CHOLHDL, LDLDIRECT in the last 72 hours. Thyroid Function Tests: No results for input(s): TSH, T4TOTAL, FREET4, T3FREE, THYROIDAB in the last 72 hours. Anemia Panel: No results for input(s): VITAMINB12, FOLATE, FERRITIN, TIBC, IRON, RETICCTPCT in the last 72 hours. Sepsis Labs: Recent Labs  Lab 01/28/19  0546 01/28/19 0948  PROCALCITON 7.08  --   LATICACIDVEN  --  1.1    Recent Results (from the past 240 hour(s))  MRSA PCR Screening     Status: None   Collection Time: 01/28/19 12:22 AM   Specimen: Nasopharyngeal  Result Value Ref Range Status   MRSA by PCR NEGATIVE NEGATIVE Final    Comment:        The GeneXpert MRSA Assay (FDA approved for NASAL specimens only), is one component of a comprehensive MRSA colonization surveillance program. It is not intended to diagnose MRSA infection nor to guide or monitor treatment for MRSA infections. Performed at [facility], [place] [address], [place], [place] [address]   Culture, blood (routine x 2)     Status: None   Collection Time: 01/28/19  3:18 AM   Specimen: BLOOD  Result Value Ref Range Status   Specimen Description BLOOD RIGHT ARM  Final   Special Requests   Final    BOTTLES DRAWN AEROBIC ONLY Blood Culture results may not be optimal due to an inadequate volume of blood received in culture bottles   Culture   Final    NO GROWTH 5 DAYS Performed at [facility], [place] [address] [place], [place] [address]    Report Status 02/02/2019 FINAL  Final  Culture, blood (routine x 2)     Status: None   Collection Time: 01/28/19  3:20 AM   Specimen: BLOOD  Result Value Ref Range Status   Specimen Description BLOOD RIGHT ARM  Final   Special Requests   Final    BOTTLES DRAWN AEROBIC ONLY Blood Culture results may not be optimal due to an inadequate volume of blood received in culture bottles   Culture   Final    NO GROWTH 5 DAYS Performed at [facility], [place] [address], [place], [place] [address]    Report Status 02/02/2019 FINAL  Final  SARS Coronavirus 2 (CEPHEID - Performed in [place] hospital lab), Hosp Order     Status: None   Collection Time: 01/28/19  8:58 AM   Specimen: Nasopharyngeal Swab  Result Value Ref Range Status   SARS Coronavirus 2 NEGATIVE NEGATIVE Final    Comment: (NOTE) If result is  NEGATIVE SARS-CoV-2 target nucleic acids are NOT DETECTED. The SARS-CoV-2 RNA is generally detectable in upper and lower  respiratory specimens during the acute phase of infection. The lowest  concentration of SARS-CoV-2 viral copies this assay can detect is 250  copies / mL. A negative result does not preclude SARS-CoV-2 infection  and should not be used as the sole basis for treatment or other  patient management decisions.  A negative result may occur with  improper specimen collection / handling, submission of specimen other  than nasopharyngeal swab, presence of viral mutation(s) within the  areas targeted by this assay, and inadequate number of viral copies  (<250 copies / mL). A negative result must be combined with clinical  observations, patient history, and epidemiological information. If result is POSITIVE SARS-CoV-2 target nucleic acids are DETECTED. The SARS-CoV-2 RNA is generally detectable in upper and lower  respiratory specimens dur ing the acute phase of infection.  Positive  results are indicative of active infection with SARS-CoV-2.  Clinical  correlation with patient history and other diagnostic information is  necessary to determine patient infection status.  Positive results do  not rule out bacterial infection or co-infection with other viruses. If result is PRESUMPTIVE POSTIVE SARS-CoV-2 nucleic acids MAY BE PRESENT.   A presumptive positive result was obtained on the submitted specimen  and confirmed on repeat testing.  While 2019 novel coronavirus  (SARS-CoV-2) nucleic acids may be present in the submitted sample  additional confirmatory testing may be necessary for epidemiological  and / or clinical management purposes  to differentiate between  SARS-CoV-2 and other Sarbecovirus currently known to infect humans.  If clinically indicated additional testing with an alternate test  methodology [phone]) is advised. The SARS-CoV-2 RNA is generally  detectable  in upper and lower respiratory sp ecimens during the acute  phase of infection. The expected result is Negative. Fact Sheet for Patients:  StrictlyIdeas.no Fact Sheet for Healthcare Providers: BankingDealers.co.za This test is not yet approved or cleared by the [place] FDA and has been authorized for detection and/or diagnosis of SARS-CoV-2 by FDA under an Emergency Use Authorization (EUA).  This EUA will remain in effect (meaning this test can be used) for the duration of the COVID-19 declaration under Section 564(b)(1) of the Act, 21 U.S.C. section 360bbb-3(b)(1), unless the authorization is terminated or revoked sooner. Performed at [facility], [place] [address], [place], [place] [address]          Radiology Studies: Ct Chest Wo Contrast  Result Date: 02/01/2019 CLINICAL DATA:  Chronic LEFT hydropneumothorax. Thoracentesis 7 05/2019. EXAM: CT CHEST WITHOUT CONTRAST TECHNIQUE: Multidetector CT imaging of the chest was performed following the standard protocol without IV contrast. COMPARISON:  CT abdomen 01/28/2019, chest radiograph 01/28/2019 FINDINGS: Cardiovascular: Coronary artery calcification and aortic atherosclerotic calcification. Mediastinum/Nodes: No axillary lymphadenopathy. No supraclavicular adenopathy. There is nodule enlargement of the thyroid gland which is symmetric side-to-side. No clear mediastinal adenopathy on noncontrast exam. Mediastinal tissue planes are poorly delineated. Lungs/Pleura: Large complex LEFT hydropneumothorax again demonstrated. There is a air-fluid level in the lower lobe with some interspersed gas. Large pneumothorax involving the upper lobe. There is atelectasis of the LEFT upper lobe and LEFT lower lobe. There is pleural thickening along the upper lobe lung surface (image 50/4). In the RIGHT lung there is a small RIGHT effusion with nodular pleuroparenchymal thickening at the RIGHT lung  base. Example nodule measuring 10 mm on image 123/4. Small ground-glass density in the RIGHT upper lobe without focality. Upper Abdomen: Limited view of the liver, kidneys, pancreas are unremarkable. Normal adrenal glands. Musculoskeletal: No aggressive osseous lesion IMPRESSION: 1. Large complex LEFT hydropneumothorax with the majority of the LEFT hemithorax occupied by gas. 2. Severe atelectasis of the LEFT upper lobe and LEFT lower lobe with pleural thickening. 3. Minimal  pleural fluid at the LEFT lung base with interspersed gas. 4. No clear mediastinal adenopathy although difficult to evaluate without contrast. 5. Atelectasis and effusion on the RIGHT with peripheral nodularity lower lobe is favored infectious or inflammatory. Recommend attention on follow-up. 6. Enlargement of the thyroid gland is favored benign goiter. Electronically Signed   By: Suzy Bouchard M.D.   On: 02/01/2019 12:10        Scheduled Meds: . amLODipine  5 mg Oral BID  . aspirin  81 mg Oral Daily  . budesonide  0.25 mg Nebulization BID  . carvedilol  25 mg Oral BID  . Chlorhexidine Gluconate Cloth  6 each Topical Daily  . Chlorhexidine Gluconate Cloth  6 each Topical Q0600  . cloNIDine  0.3 mg Oral BID  . darbepoetin (ARANESP) injection - DIALYSIS  60 mcg Intravenous Q Tue-HD  . feeding supplement  1 Container Oral TID BM  . feeding supplement (PRO-STAT SUGAR FREE 64)  30 mL Oral BID  . ferrous sulfate  325 mg Oral Daily  . FLUoxetine  20 mg Oral Daily  . heparin  5,000 Units Subcutaneous Q8H  . hydrALAZINE  100 mg Oral TID  . insulin aspart  0-9 Units Subcutaneous Q4H  . mouth rinse  15 mL Mouth Rinse BID  . metoCLOPramide (REGLAN) injection  10 mg Intravenous TID AC & HS  . montelukast  10 mg Oral Daily  . multivitamin  1 tablet Oral QHS  . nystatin  5 mL Oral QID  . pantoprazole  40 mg Oral Q0600   Continuous Infusions: . sodium chloride 250 mL (02/01/19 0928)  . levofloxacin (LEVAQUIN) IV 500 mg  (02/03/19 0351)  . vancomycin Stopped (02/02/19 1150)     LOS: 6 days    Time spent: 35 mins.More than 50% of that time was spent in counseling and/or coordination of care.      Shelly Coss, MD [facility] Pager [phone]  If 7PM-7AM, please contact night-coverage [email or web address] Password [facility] 02/03/2019, 10:44 AM

## 2019-02-03 NOTE — Plan of Care (Signed)

## 2019-02-03 NOTE — Progress Notes (Signed)
Physical Therapy Treatment Patient Details Name: Dai Mcadams MRN: [id number] DOB: 08/28/1955 Today's Date: 02/03/2019    History of Present Illness 63 year old male with history of ESRD on TTS- iHD via LUE AVF, DM, tobacco abuse, diabetic gastroparesis, GERD, HTN, diastolic HF, multiple sclerosis, CVA w/residual left sided weakness, chronic left pleural effusion, hx cdiff, diabetic neuropathy, anemia, HLD, and depression, was admitted on 7/11 to [facility] with acute shortness of breath and hypertensive urgency.  Found on admit to have complete opacification of left hemithorax. Acute on chronic pleural effusions s/p left VATS, concern for pleural mass vs infectious consolidation    PT Comments    Continuing work on functional mobility and activity tolerance;  Very good progress with gait distance and activity tolerance, able to incr amb distance, one seated rest break; Continue to recommend comprehensive inpatient rehab (CIR) for post-acute therapy needs.    Follow Up Recommendations  CIR     Equipment Recommendations  Rolling walker with 5" wheels;3in1 (PT)(he may already have)    Recommendations for Other Services       Precautions / Restrictions Precautions Precautions: Fall    Mobility  Bed Mobility Overal bed mobility: Needs Assistance Bed Mobility: Supine to Sit     Supine to sit: Min assist     General bed mobility comments: Min assist to elevate trunk  Transfers Overall transfer level: Needs assistance Equipment used: Rolling walker (2 wheeled) Transfers: Sit to/from Stand Sit to Stand: Mod assist         General transfer comment: Light mod assist to power up into standing; cues for hand placement  Ambulation/Gait Ambulation/Gait assistance: Min assist;+2 safety/equipment Gait Distance (Feet): 40 Feet(x2) Assistive device: Rolling walker (2 wheeled) Gait Pattern/deviations: Step-through pattern;Narrow base of support;Trunk flexed;Decreased  step length - left     General Gait Details: heavy dependence on RW for stability; noting difficulty advancing LLE, resulting in center of mass translating too far infront of base of support; Couple that with lack of control of RW, and we have a very high fall risk; Heavy moderate assist to control RW; near constant cues for better L stepping; still, no buckling of knee noted   Stairs             Wheelchair Mobility    Modified Rankin (Stroke Patients Only)       Balance     Sitting balance-Leahy Scale: Fair       Standing balance-Leahy Scale: Poor Standing balance comment: reliant on Bil. UE support                             Cognition Arousal/Alertness: Awake/alert Behavior During Therapy: WFL for tasks assessed/performed Overall Cognitive Status: Impaired/Different from baseline Area of Impairment: Attention;Safety/judgement;Awareness;Problem solving                   Current Attention Level: Sustained   Following Commands: Follows one step commands consistently              Exercises      General Comments        Pertinent Vitals/Pain Pain Assessment: No/denies pain    Home Living                      Prior Function            PT Goals (current goals can now be found in the care plan section) Acute Rehab  PT Goals Patient Stated Goal: to get stronger  PT Goal Formulation: With patient Time For Goal Achievement: 02/14/19 Potential to Achieve Goals: Good Progress towards PT goals: Progressing toward goals    Frequency    Min 3X/week      PT Plan Current plan remains appropriate    Co-evaluation              AM-PAC PT "6 Clicks" Mobility   Outcome Measure  Help needed turning from your back to your side while in a flat bed without using bedrails?: A Little Help needed moving from lying on your back to sitting on the side of a flat bed without using bedrails?: A Little Help needed moving to and  from a bed to a chair (including a wheelchair)?: A Little Help needed standing up from a chair using your arms (e.g., wheelchair or bedside chair)?: A Little Help needed to walk in hospital room?: A Lot Help needed climbing 3-5 steps with a railing? : A Lot 6 Click Score: 16    End of Session Equipment Utilized During Treatment: Gait belt Activity Tolerance: Patient tolerated treatment well Patient left: in bed;with call bell/phone within reach;with bed alarm set Nurse Communication: Mobility status PT Visit Diagnosis: Other abnormalities of gait and mobility (R26.89);Muscle weakness (generalized) (M62.81)     Time: 6010-9323 PT Time Calculation (min) (ACUTE ONLY): 17 min  Charges:  $Gait Training: 8-22 mins                     [name], [place]  Acute Rehabilitation Services Pager [phone] Office [place] 02/03/2019, 4:37 PM

## 2019-02-03 NOTE — Progress Notes (Signed)
Inpatient Rehab Admissions:  Inpatient Rehab Consult received.  I met with patient at the bedside for rehabilitation assessment and to discuss goals and expectations of an inpatient rehab admission.  I was also able to speak with his wife over the phone.  Both are hopeful for a CIR admission later this week/early next week pending insurance approval and medical readiness.  Wife is able to provide 24/7 supervision.   Signed: Shann Medal, PT, DPT Admissions Coordinator [phone] 02/03/19  3:48 PM

## 2019-02-03 NOTE — Progress Notes (Signed)
Patient ID: Cole Nelson, male   DOB: [birth year], 63 y.o.   MRN: [id number] [facility] Progress Note   Assessment/ Plan:   1.  Acute on chronic pleural effusion status post VATS: Remains on broad-spectrum antibiotics with Levaquin and vancomycin (stop date to be clarified).  Plans noted for outpatient follow-up with [facility] thoracic surgery. 2. ESRD: Continue hemodialysis on a TTS schedule with next dialysis due tomorrow; he does not have any acute indications for dialysis today and appears to be euvolemic.  Plans noted for possible admission to CIR. 3. Anemia: No overt loss with slight downtrend of hemoglobin and hematocrit and status post Aranesp yesterday.  Will check iron studies today. 4. CKD-MBD: Recheck phosphorus levels with labs tomorrow. 5. 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 6. Hypertension: Blood pressure noted, monitor with hemodialysis/UF.  Subjective:   Reports to be feeling fair, states that he is waiting for transfer to rehab.   Objective:   BP 132/75 (BP Location: Right Arm)   Pulse 86   Temp 98.4 F (36.9 C) (Oral)   Resp 18   Ht 5\' 11"  (1.803 m)   Wt 62.6 kg   SpO2 94%   BMI 19.25 kg/m   Physical Exam: Gen: Appears comfortable resting in bed, watching television CVS: Pulse regular rhythm, normal rate, S1 and S2 normal Resp: Clear to auscultation, no rales/rhonchi Abd: Soft, flat, nontender Ext: Left upper arm AV fistula with positive thrill.  No pedal edema  Labs: BMET Recent Labs  Lab 01/28/19 0546 01/29/19 0309 01/30/19 0511 01/31/19 0747 02/01/19 0719 02/02/19 0555 02/03/19 0535  NA 136 135 133* 133* 130* 130* 133*  K 5.3* 3.1* 3.4* 3.9 4.1 4.2 3.7  CL 85* 86* 94* 95* 94* 95* 96*  CO2 28 28 24 25 23 22 25   GLUCOSE 132* 84 120* 100* 219* 115* 95  BUN 76* 84* 38* 22 37* 49* 29*  CREATININE 9.24* 10.08* 5.93* 4.09* 5.82* 7.03* 4.87*  CALCIUM  9.0 9.0 8.5* 8.7* 8.6* 8.9 8.8*  PHOS 8.8* 6.8*  --   --   --   --   --    CBC Recent Labs  Lab 01/31/19 0747 02/01/19 0719 02/02/19 0555 02/03/19 0535  WBC 9.9 6.9 5.3 5.9  NEUTROABS 7.1 4.7 3.5 3.7  HGB 7.5* 7.3* 7.5* 7.0*  HCT 23.7* 23.3* 23.7* 22.8*  MCV 79.5* 79.5* 78.7* 81.1  PLT 267 265 261 250   Medications:    . amLODipine  5 mg Oral BID  . aspirin  81 mg Oral Daily  . budesonide  0.25 mg Nebulization BID  . carvedilol  25 mg Oral BID  . Chlorhexidine Gluconate Cloth  6 each Topical Daily  . Chlorhexidine Gluconate Cloth  6 each Topical Q0600  . cloNIDine  0.3 mg Oral BID  . darbepoetin (ARANESP) injection - DIALYSIS  60 mcg Intravenous Q Tue-HD  . feeding supplement  1 Container Oral TID BM  . feeding supplement (PRO-STAT SUGAR FREE 64)  30 mL Oral BID  . ferrous sulfate  325 mg Oral Daily  . FLUoxetine  20 mg Oral Daily  . heparin  5,000 Units Subcutaneous Q8H  . hydrALAZINE  100 mg Oral TID  . insulin aspart  0-9 Units Subcutaneous Q4H  . mouth rinse  15 mL Mouth Rinse BID  . metoCLOPramide (REGLAN) injection  10 mg Intravenous TID AC & HS  . montelukast  10 mg Oral Daily  .  multivitamin  1 tablet Oral QHS  . nystatin  5 mL Oral QID  . pantoprazole  40 mg Oral Q0600   Elmarie Shiley, MD 02/03/2019, 10:22 AM

## 2019-02-03 NOTE — Progress Notes (Signed)
Inpatient Diabetes Program Recommendations  AACE/ADA: New Consensus Statement on Inpatient Glycemic Control (2015)  Target Ranges:  Prepandial:   less than 140 mg/dL      Peak postprandial:   less than 180 mg/dL (1-2 hours)      Critically ill patients:  140 - 180 mg/dL   Lab Results  Component Value Date   GLUCAP 126 (H) 02/03/2019    Review of Glycemic Control Results for Cole Nelson, Cole Nelson (MRN [id number]) as of 02/03/2019 11:20  Ref. Range 02/02/2019 17:27 02/02/2019 20:15 02/03/2019 00:08 02/03/2019 04:40 02/03/2019 07:22  Glucose-Capillary Latest Ref Range: 70 - 99 mg/dL 201 (H) 81 252 (H) 74 126 (H)   Diabetes history: DM 2 Outpatient Diabetes medications:  Levemir 10 units q HS, Tradjenta 5 mg daily Current orders for Inpatient glycemic control:  Novolog sensitive q 4 hours Inpatient Diabetes Program Recommendations:   Please consider changing Novolog correction to tid with meals.  Also consider adding Levemir 5 units daily.   Thanks,  Adah Perl, RN, BC-ADM Inpatient Diabetes Coordinator Pager [phone] (8a-5p)

## 2019-02-03 NOTE — Progress Notes (Signed)
Pharmacy Antibiotic Note  Cole Nelson is a 63 y.o. male admitted on 01/28/2019 with PNA from OSH. Pharmacy has been consulted for levofloxacin and vancomycin dosing.  Continues on Vancomycin and Levaquin - Day # 7 at [facility] Cultures negative Afebrile  Plan: Vancomycin 750 mg iv with hemodialysis Levaquin 500 mg iv Q 48 hours Stop antibiotics?   Height: 5\' 11"  (180.3 cm) Weight: 138 lb 0.1 oz (62.6 kg) IBW/kg (Calculated) : 75.3  Temp (24hrs), Avg:98.3 F (36.8 C), Min:97.7 F (36.5 C), Max:98.6 F (37 C)  Recent Labs  Lab 01/28/19 0604 01/28/19 0948  01/30/19 0511 01/31/19 0747 02/01/19 0719 02/02/19 0555 02/03/19 0535  WBC  --   --    < > 11.0* 9.9 6.9 5.3 5.9  CREATININE  --   --    < > 5.93* 4.09* 5.82* 7.03* 4.87*  LATICACIDVEN  --  1.1  --   --   --   --   --   --   VANCORANDOM 30  --   --   --   --   --   --   --    < > = values in this interval not displayed.    Estimated Creatinine Clearance: 13.9 mL/min (A) (by C-G formula based on SCr of 4.87 mg/dL (H)).    Allergies  Allergen Reactions  . Baclofen     confusion  . Cefepime     Confusion, hives   . Insulins     Patient states his mind started going in and out.   Marland Kitchen Penicillins     Vanc 7/20 (started at OSH)>> - Last dose from OSH - 7/22 @ 0113 - 7/23 VR = 30 mcg/ml LVQ  (unknown start date)>>  7/23 BCx >> ngtd 7/23 MRSA PCR- neg  Thank you for allowing pharmacy to be a part of this patient's care. Anette Guarneri, PharmD [phone]  02/03/2019 8:32 AM   **Pharmacist phone directory can now be found on [email or web address] ([email or web address]).  Listed under [place].

## 2019-02-03 NOTE — Plan of Care (Signed)
  Problem: Activity: Goal: Risk for activity intolerance will decrease Outcome: Progressing   Pt OOB to chair.

## 2019-02-04 LAB — BASIC METABOLIC PANEL
Anion gap: 15 (ref 5–15)
BUN: 39 mg/dL — ABNORMAL HIGH (ref 8–23)
CO2: 22 mmol/L (ref 22–32)
Calcium: 8.9 mg/dL (ref 8.9–10.3)
Chloride: 95 mmol/L — ABNORMAL LOW (ref 98–111)
Creatinine, Ser: 6.54 mg/dL — ABNORMAL HIGH (ref 0.61–1.24)
GFR calc Af Amer: 10 mL/min — ABNORMAL LOW (ref >60)
GFR calc non Af Amer: 8 mL/min — ABNORMAL LOW (ref >60)
Glucose, Bld: 144 mg/dL — ABNORMAL HIGH (ref 70–99)
Potassium: 3.7 mmol/L (ref 3.5–5.1)
Sodium: 132 mmol/L — ABNORMAL LOW (ref 135–145)

## 2019-02-04 LAB — CBC WITH DIFFERENTIAL/PLATELET
Abs Immature Granulocytes: 0.02 10*3/uL (ref 0.00–0.07)
Basophils Absolute: 0.1 10*3/uL (ref 0.0–0.1)
Basophils Relative: 1 %
Eosinophils Absolute: 0.4 10*3/uL (ref 0.0–0.5)
Eosinophils Relative: 7 %
HCT: 22.2 % — ABNORMAL LOW (ref 39.0–52.0)
Hemoglobin: 7 g/dL — ABNORMAL LOW (ref 13.0–17.0)
Immature Granulocytes: 0 %
Lymphocytes Relative: 12 %
Lymphs Abs: 0.7 10*3/uL (ref 0.7–4.0)
MCH: 24.8 pg — ABNORMAL LOW (ref 26.0–34.0)
MCHC: 31.5 g/dL (ref 30.0–36.0)
MCV: 78.7 fL — ABNORMAL LOW (ref 80.0–100.0)
Monocytes Absolute: 0.7 10*3/uL (ref 0.1–1.0)
Monocytes Relative: 14 %
Neutro Abs: 3.6 10*3/uL (ref 1.7–7.7)
Neutrophils Relative %: 66 %
Platelets: 250 10*3/uL (ref 150–400)
RBC: 2.82 MIL/uL — ABNORMAL LOW (ref 4.22–5.81)
RDW: 14.8 % (ref 11.5–15.5)
WBC: 5.4 10*3/uL (ref 4.0–10.5)
nRBC: 0 % (ref 0.0–0.2)

## 2019-02-04 LAB — GLUCOSE, CAPILLARY
Glucose-Capillary: 102 mg/dL — ABNORMAL HIGH (ref 70–99)
Glucose-Capillary: 107 mg/dL — ABNORMAL HIGH (ref 70–99)
Glucose-Capillary: 116 mg/dL — ABNORMAL HIGH (ref 70–99)
Glucose-Capillary: 138 mg/dL — ABNORMAL HIGH (ref 70–99)
Glucose-Capillary: 163 mg/dL — ABNORMAL HIGH (ref 70–99)
Glucose-Capillary: 208 mg/dL — ABNORMAL HIGH (ref 70–99)
Glucose-Capillary: 53 mg/dL — ABNORMAL LOW (ref 70–99)

## 2019-02-04 LAB — HEMOGLOBIN AND HEMATOCRIT, BLOOD
HCT: 27.8 % — ABNORMAL LOW (ref 39.0–52.0)
Hemoglobin: 9 g/dL — ABNORMAL LOW (ref 13.0–17.0)

## 2019-02-04 MED ORDER — DEXTROSE 50 % IV SOLN
INTRAVENOUS | Status: AC
  Filled 2019-02-04: qty 50

## 2019-02-04 MED ORDER — NEPRO/CARBSTEADY PO LIQD
237.0000 mL | Freq: Two times a day (BID) | ORAL | Status: DC
  Administered 2019-02-04 – 2019-02-15 (×14): 237 mL via ORAL

## 2019-02-04 MED ORDER — HEPARIN SODIUM (PORCINE) 1000 UNIT/ML DIALYSIS: 40.0000 [IU]/kg | INTRAMUSCULAR | Status: DC | PRN

## 2019-02-04 MED ORDER — GLUCOSE 40 % PO GEL
2.0000 | ORAL | Status: AC
  Administered 2019-02-04: 75 g via ORAL
  Filled 2019-02-04: qty 2

## 2019-02-04 NOTE — Procedures (Signed)
Patient seen on Hemodialysis. BP (!) 122/59 (BP Location: Right Arm)   Pulse 75   Temp 98 F (36.7 C) (Oral)   Resp 18   Ht 5\' 11"  (1.803 m)   Wt 68.3 kg   SpO2 97%   BMI 21.00 kg/m   QB 400, UF goal 1.5L Tolerating treatment without complaints at this time.   Elmarie Shiley MD [facility]. Office # [phone] Pager # [phone] 10:58 AM

## 2019-02-04 NOTE — Care Management Important Message (Signed)
Important Message  Patient Details  Name: Cole Nelson MRN: [id number] Date of Birth: 1956/03/22   Medicare Important Message Given:  Yes     Orbie Pyo 02/04/2019, 12:48 PM

## 2019-02-04 NOTE — Progress Notes (Signed)
PT Cancellation Note  Patient Details Name: Cole Nelson MRN: [id number] DOB: [birth year]   Cancelled Treatment:     Pt unavailable , off unit at HD. Will follow.   Reinaldo Berber, PT, DPT Acute Rehabilitation Services Pager: [phone] Office: [phone]     Reinaldo Berber 02/04/2019, 10:17 AM

## 2019-02-04 NOTE — Progress Notes (Signed)
Nutrition Follow-up  DOCUMENTATION CODES:   Underweight, Severe malnutrition in context of chronic illness  INTERVENTION:   D/C Boost   Add Nepro Shake po BID, each supplement provides 425 kcal and 19 grams protein  Continue 30 ml Prostat BID, each supplement provides 100 kcals and 15 grams protein  Continue renal MVI daily  NUTRITION DIAGNOSIS:   Severe Malnutrition related to chronic illness(ESRD on HD, CHF) as evidenced by severe muscle depletion, severe fat depletion.  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 PMH of ESRD on HD, DM, diabetic gastroparesis, GERD, HTN, CHF, MS, CVA with residual left-sided weakness, HLD, anemia.   7/24 - NG tube d/c, clear liquids 7/26- diet advanced renal/CM  Pt in HD at time of RD visit. Meal completions charted as 55-100% for pt's last five meals. Per flowsheet, pt drinking Boost off/on. RD to change to Nepro to maximize kcal and protein given malnutrition status.   Admission weight: 57.4 kg Current weight: 68.3 kg  I/O: +4,674 ml since admit Last HD 7/28- 1978 ml net UF   Medications: aranesp, ferrous sulfate, SS novolog, 10 mg reglan QID, rena-vit Labs: Na 132 (L) CBG 53-234  Diet Order:   Diet Order            Diet renal/carb modified with fluid restriction Diet-HS Snack? Nothing; Fluid restriction: 1200 mL Fluid; Room service appropriate? Yes; Fluid consistency: Thin  Diet effective now               EDUCATION NEEDS:   Not appropriate for education at this time  Skin:  Skin Assessment: Skin Integrity Issues: Skin Integrity Issues:: Stage II Stage II: right buttocks  Last BM:  7/29  Height:   Ht Readings from Last 1 Encounters:  01/30/19 5\' 11"  (1.803 m)    Weight:   Wt Readings from Last 1 Encounters:  02/04/19 68.3 kg    Ideal Body Weight:  78.2 kg  BMI:  Body mass index is 21 kg/m.  Estimated Nutritional Needs:   Kcal:  1800-2000  Protein:  80-95 grams  Fluid:  >/= 1.8 L   Mariana Single RD, LDN Clinical Nutrition Pager # [phone]

## 2019-02-04 NOTE — Plan of Care (Signed)
  Problem: Education: Goal: Knowledge of General Education information will improve Description Including pain rating scale, medication(s)/side effects and non-pharmacologic comfort measures Outcome: Progressing   

## 2019-02-04 NOTE — Progress Notes (Signed)
PROGRESS NOTE    Cole Nelson  DVV:[id number] DOB: Nov 06, 1955 DOA: 01/28/2019 PCP: Patient, No Pcp Per   Brief Narrative:  Patient is a 63 year old male with history of ESRD on dialysis, diabetes mellitus, tobacco abuse, diabetes gastroparesis, GERD, hypertension, diastolic heart failure, multiple sclerosis, CVA with residual left-sided weakness, chronic left pleural effusion, chronic anemia who was admitted on 7/11 at [facility] with acute shortness of breath and hypertensive urgency.  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  Patient has been transferred to hospital service.  Currently he is hemodynamically stable.  He is being dialysed and is on antibiotics.  PT/OT evaluated him and recommended CIR on discharge. Patient is medically stable for discharge to CIR as soon as the bed is available.  Assessment & Plan:   Active Problems:   Acute encephalopathy   Pressure injury of skin   Pleural effusion   End-stage renal disease on hemodialysis (HCC)   Protein-calorie malnutrition, severe   Nausea and vomiting   Acute gastric erosion   Abnormal CT scan, small bowel   Acute on chronic pleural effusion: Status post left VATS.  Concern for pleural mass versus infectious consolidation.  Currently he is hemodynamically stable.  Not septic.  Currently on room air.  Blood cultures no growth till date.  Pleural fluid cytology did not show any neoplasia.Repeat CT chest without contrast for follow-up showed chronic large complex left hydropneumothorax (currently in no respiratory distress, off oxygen saturating well). Continue renally dosed vancomycin, Levaquin for total of 7 days and reevaluate.Last day will be tomorrow. The case was discussed with cardiothoracic at [facility] and felt that this is acute consolidation rather than a mass and recommended further  outpatient follow up after completion of antibiotic therapy.  Plan is to follow-up with cardiothoracic surgeons at [facility] for further management once discharged.  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  Tolerating diet.  CT showed no clear evidence of SMA syndrome.  General surgery was following.  GI was also following and underwent enteroscopy on 01/31/2019 which showed food residue in the stomach consistent with gastroparesis, gastric erosions, status post biopsy.  Neurology in the gentleman jejunum which were also biopsied.  No evidence of inflammation, stricture.  GI recommended gastroparesis diet, Reglan, daily PPI.  ESRD on dialysis: Dialyzed on TTS.  Nephrology following.  Hypertension/diastolic heart failure: Currently euvolemic.  Echo showed hyperdynamic left ventricular ejection fraction.  Blood pressure stable.  Continue his home regimen  Anemia of chronic disease:   He will be transfused with 1 unit of PRBC during dialysis today.  Diabetes type 2: Continue sliding scale insulin.  Home regimen on hold.  Multiple sclerosis: Stable  GERD: Continue PPI  Debility/Deconditioning: Patient evaluated PT and OT and recommended CIR on discharge.  CIR consulted and following.   Nutrition Problem: Severe Malnutrition Etiology: chronic illness(ESRD on HD, CHF)      DVT prophylaxis: heparin [place] Code Status: Full Family Communication: None Disposition Plan: CIR as soon as bed is available   Consultants: PCCM, nephrology, GI  Procedures: Hemodialysis, enteroscopy  Antimicrobials:  Anti-infectives (From admission, onward)   Start     Dose/Rate Route Frequency Ordered Stop   02/02/19 1200  vancomycin (VANCOCIN) IVPB 750 mg/150 ml premix     750 mg 150 mL/hr over 60 Minutes Intravenous Every T-Th-Sa (Hemodialysis) 01/31/19 0846 02/04/19 2359   02/01/19 0800  vancomycin (VANCOCIN) IVPB 750 mg/150 ml premix     750 mg 150 mL/hr over 60 Minutes  Intravenous  Once 02/01/19 0753 02/01/19 1030   01/28/19 0330  levofloxacin (LEVAQUIN) IVPB 500 mg     500 mg 100 mL/hr over 60 Minutes Intravenous Every 48 hours 01/28/19 0323 02/03/19 0451   01/28/19 0323  vancomycin variable dose per unstable renal function (pharmacist dosing)  Status:  Discontinued      Does not apply See admin instructions 01/28/19 0323 02/01/19 1400      Subjective:  Patient seen and examined the bedside this morning at the bedside.  Hemodynamically stable.  Comfortable.  Sleeping.  Denies any new complaints..  Objective: Vitals:   02/04/19 0950 02/04/19 1000 02/04/19 1030 02/04/19 1100  BP: 129/63 (!) 143/67 (!) 122/59 (!) 161/72  Pulse: 70 72 75 86  Resp:   18   Temp:      TempSrc:      SpO2:      Weight:      Height:        Intake/Output Summary (Last 24 hours) at 02/04/2019 1125 Last data filed at 02/04/2019 0601 Gross per 24 hour  Intake 708.47 ml  Output 0 ml  Net 708.47 ml   Filed Weights   02/02/19 1059 02/02/19 2015 02/04/19 0928  Weight: 62.6 kg 62.6 kg 68.3 kg    Examination:  General exam: Deconditioned/debilitated, cachectic HEENT:PERRL,Oral mucosa moist, Ear/Nose normal on gross exam Respiratory system: Decreased air entry on the left side, basal crackles on the left side Cardiovascular system: S1 & S2 heard, RRR. No JVD, murmurs, rubs, gallops or clicks. Gastrointestinal system: Abdomen is nondistended, soft and nontender. No organomegaly or masses felt. Normal bowel sounds heard. Central nervous system: Alert and oriented. No focal neurological deficits. Extremities: No edema, no clubbing ,no cyanosis, distal peripheral pulses palpable.  AV graft on the left arm  skin: No rashes, lesions or ulcers,no icterus ,no pallor   Data Reviewed: I have personally reviewed following labs and imaging studies  CBC: Recent Labs  Lab 01/31/19 0747 02/01/19 0719 02/02/19 0555 02/03/19 0535 02/04/19 0814  WBC 9.9 6.9 5.3 5.9 5.4   NEUTROABS 7.1 4.7 3.5 3.7 3.6  HGB 7.5* 7.3* 7.5* 7.0* 7.0*  HCT 23.7* 23.3* 23.7* 22.8* 22.2*  MCV 79.5* 79.5* 78.7* 81.1 78.7*  PLT 267 265 261 250 170   Basic Metabolic Panel: Recent Labs  Lab 01/29/19 0309  01/31/19 0747 02/01/19 0719 02/02/19 0555 02/03/19 0535 02/03/19 1117 02/04/19 0814  NA 135   < > 133* 130* 130* 133*  --  132*  K 3.1*   < > 3.9 4.1 4.2 3.7  --  3.7  CL 86*   < > 95* 94* 95* 96*  --  95*  CO2 28   < > 25 23 22 25   --  22  GLUCOSE 84   < > 100* 219* 115* 95  --  144*  BUN 84*   < > 22 37* 49* 29*  --  39*  CREATININE 10.08*   < > 4.09* 5.82* 7.03* 4.87*  --  6.54*  CALCIUM 9.0   < > 8.7* 8.6* 8.9 8.8*  --  8.9  MG 2.1  --   --   --   --   --   --   --   PHOS 6.8*  --   --   --   --   --  3.4  --    < > =  values in this interval not displayed.   GFR: Estimated Creatinine Clearance: 11.3 mL/min (A) (by C-G formula based on SCr of 6.54 mg/dL (H)). Liver Function Tests: Recent Labs  Lab 01/30/19 0511  AST 31  ALT 14  ALKPHOS 62  BILITOT 1.2  PROT 6.7  ALBUMIN 2.4*   No results for input(s): LIPASE, AMYLASE in the last 168 hours. No results for input(s): AMMONIA in the last 168 hours. Coagulation Profile: No results for input(s): INR, PROTIME in the last 168 hours. Cardiac Enzymes: No results for input(s): CKTOTAL, CKMB, CKMBINDEX, TROPONINI in the last 168 hours. BNP (last 3 results) No results for input(s): PROBNP in the last 8760 hours. HbA1C: No results for input(s): HGBA1C in the last 72 hours. CBG: Recent Labs  Lab 02/03/19 2103 02/03/19 2350 02/04/19 0405 02/04/19 0448 02/04/19 0722  GLUCAP 130* 234* 53* 163* 116*   Lipid Profile: No results for input(s): CHOL, HDL, LDLCALC, TRIG, CHOLHDL, LDLDIRECT in the last 72 hours. Thyroid Function Tests: No results for input(s): TSH, T4TOTAL, FREET4, T3FREE, THYROIDAB in the last 72 hours. Anemia Panel: Recent Labs    02/03/19 1117  FERRITIN 1,429*  TIBC 169*  IRON 75   Sepsis  Labs: No results for input(s): PROCALCITON, LATICACIDVEN in the last 168 hours.  Recent Results (from the past 240 hour(s))  MRSA PCR Screening     Status: None   Collection Time: 01/28/19 12:22 AM   Specimen: Nasopharyngeal  Result Value Ref Range Status   MRSA by PCR NEGATIVE NEGATIVE Final    Comment:        The GeneXpert MRSA Assay (FDA approved for NASAL specimens only), is one component of a comprehensive MRSA colonization surveillance program. It is not intended to diagnose MRSA infection nor to guide or monitor treatment for MRSA infections. Performed at [facility], [place] [address], [place], [place] [address]   Culture, blood (routine x 2)     Status: None   Collection Time: 01/28/19  3:18 AM   Specimen: BLOOD  Result Value Ref Range Status   Specimen Description BLOOD RIGHT ARM  Final   Special Requests   Final    BOTTLES DRAWN AEROBIC ONLY Blood Culture results may not be optimal due to an inadequate volume of blood received in culture bottles   Culture   Final    NO GROWTH 5 DAYS Performed at [facility], [place] [address], [place], [place] [address]    Report Status 02/02/2019 FINAL  Final  Culture, blood (routine x 2)     Status: None   Collection Time: 01/28/19  3:20 AM   Specimen: BLOOD  Result Value Ref Range Status   Specimen Description BLOOD RIGHT ARM  Final   Special Requests   Final    BOTTLES DRAWN AEROBIC ONLY Blood Culture results may not be optimal due to an inadequate volume of blood received in culture bottles   Culture   Final    NO GROWTH 5 DAYS Performed at [facility], [place] [address] [place],  [address]    Report Status 02/02/2019 FINAL  Final  SARS Coronavirus 2 (CEPHEID - Performed in [place] hospital lab), Hosp Order     Status: None   Collection Time: 01/28/19  8:58 AM   Specimen: Nasopharyngeal Swab  Result Value Ref Range Status   SARS Coronavirus 2 NEGATIVE NEGATIVE Final    Comment: (NOTE) If  result is NEGATIVE SARS-CoV-2 target nucleic acids are NOT  DETECTED. The SARS-CoV-2 RNA is generally detectable in upper and lower  respiratory specimens during the acute phase of infection. The lowest  concentration of SARS-CoV-2 viral copies this assay can detect is 250  copies / mL. A negative result does not preclude SARS-CoV-2 infection  and should not be used as the sole basis for treatment or other  patient management decisions.  A negative result may occur with  improper specimen collection / handling, submission of specimen other  than nasopharyngeal swab, presence of viral mutation(s) within the  areas targeted by this assay, and inadequate number of viral copies  (<250 copies / mL). A negative result must be combined with clinical  observations, patient history, and epidemiological information. If result is POSITIVE SARS-CoV-2 target nucleic acids are DETECTED. The SARS-CoV-2 RNA is generally detectable in upper and lower  respiratory specimens dur ing the acute phase of infection.  Positive  results are indicative of active infection with SARS-CoV-2.  Clinical  correlation with patient history and other diagnostic information is  necessary to determine patient infection status.  Positive results do  not rule out bacterial infection or co-infection with other viruses. If result is PRESUMPTIVE POSTIVE SARS-CoV-2 nucleic acids MAY BE PRESENT.   A presumptive positive result was obtained on the submitted specimen  and confirmed on repeat testing.  While 2019 novel coronavirus  (SARS-CoV-2) nucleic acids may be present in the submitted sample  additional confirmatory testing may be necessary for epidemiological  and / or clinical management purposes  to differentiate between  SARS-CoV-2 and other Sarbecovirus currently known to infect humans.  If clinically indicated additional testing with an alternate test  methodology [phone]) is advised. The SARS-CoV-2 RNA is generally   detectable in upper and lower respiratory sp ecimens during the acute  phase of infection. The expected result is Negative. Fact Sheet for Patients:  StrictlyIdeas.no Fact Sheet for Healthcare Providers: BankingDealers.co.za This test is not yet approved or cleared by the [place] FDA and has been authorized for detection and/or diagnosis of SARS-CoV-2 by FDA under an Emergency Use Authorization (EUA).  This EUA will remain in effect (meaning this test can be used) for the duration of the COVID-19 declaration under Section 564(b)(1) of the Act, 21 U.S.C. section 360bbb-3(b)(1), unless the authorization is terminated or revoked sooner. Performed at [facility], [place] [address] [place], [place] [address]          Radiology Studies: No results found.      Scheduled Meds: . sodium chloride   Intravenous Once  . amLODipine  5 mg Oral BID  . aspirin  81 mg Oral Daily  . budesonide  0.25 mg Nebulization BID  . carvedilol  25 mg Oral BID  . Chlorhexidine Gluconate Cloth  6 each Topical Daily  . Chlorhexidine Gluconate Cloth  6 each Topical Q0600  . cloNIDine  0.3 mg Oral BID  . darbepoetin (ARANESP) injection - DIALYSIS  60 mcg Intravenous Q Tue-HD  . dextrose      . feeding supplement  1 Container Oral TID BM  . feeding supplement (PRO-STAT SUGAR FREE 64)  30 mL Oral BID  . ferrous sulfate  325 mg Oral Daily  . FLUoxetine  20 mg Oral Daily  . heparin  5,000 Units Subcutaneous Q8H  . hydrALAZINE  100 mg Oral TID  . insulin aspart  0-9 Units Subcutaneous Q4H  . mouth rinse  15 mL Mouth Rinse BID  . metoCLOPramide (REGLAN) injection  10 mg  Intravenous TID AC & HS  . montelukast  10 mg Oral Daily  . multivitamin  1 tablet Oral QHS  . nystatin  5 mL Oral QID  . pantoprazole  40 mg Oral Q0600   Continuous Infusions: . sodium chloride 250 mL (02/01/19 0928)  . vancomycin Stopped (02/02/19 1150)     LOS: 7 days     Time spent: 35 mins.More than 50% of that time was spent in counseling and/or coordination of care.      Shelly Coss, MD [facility] Pager [phone]  If 7PM-7AM, please contact night-coverage [email or web address] Password [email or web address] 02/04/2019, 11:25 AM

## 2019-02-05 LAB — CBC WITH DIFFERENTIAL/PLATELET
Abs Immature Granulocytes: 0.02 10*3/uL (ref 0.00–0.07)
Basophils Absolute: 0.1 10*3/uL (ref 0.0–0.1)
Basophils Relative: 1 %
Eosinophils Absolute: 0.6 10*3/uL — ABNORMAL HIGH (ref 0.0–0.5)
Eosinophils Relative: 7 %
HCT: 26.2 % — ABNORMAL LOW (ref 39.0–52.0)
Hemoglobin: 8.4 g/dL — ABNORMAL LOW (ref 13.0–17.0)
Immature Granulocytes: 0 %
Lymphocytes Relative: 9 %
Lymphs Abs: 0.7 10*3/uL (ref 0.7–4.0)
MCH: 25.9 pg — ABNORMAL LOW (ref 26.0–34.0)
MCHC: 32.1 g/dL (ref 30.0–36.0)
MCV: 80.9 fL (ref 80.0–100.0)
Monocytes Absolute: 0.9 10*3/uL (ref 0.1–1.0)
Monocytes Relative: 11 %
Neutro Abs: 5.9 10*3/uL (ref 1.7–7.7)
Neutrophils Relative %: 72 %
Platelets: 249 10*3/uL (ref 150–400)
RBC: 3.24 MIL/uL — ABNORMAL LOW (ref 4.22–5.81)
RDW: 14.9 % (ref 11.5–15.5)
WBC: 8.2 10*3/uL (ref 4.0–10.5)
nRBC: 0 % (ref 0.0–0.2)

## 2019-02-05 LAB — GLUCOSE, CAPILLARY
Glucose-Capillary: 114 mg/dL — ABNORMAL HIGH (ref 70–99)
Glucose-Capillary: 132 mg/dL — ABNORMAL HIGH (ref 70–99)
Glucose-Capillary: 118 mg/dL — ABNORMAL HIGH (ref 70–99)
Glucose-Capillary: 122 mg/dL — ABNORMAL HIGH (ref 70–99)
Glucose-Capillary: 181 mg/dL — ABNORMAL HIGH (ref 70–99)

## 2019-02-05 LAB — TYPE AND SCREEN
ABO/RH(D): A POS
Antibody Screen: NEGATIVE
Unit division: 0

## 2019-02-05 LAB — BPAM RBC
Blood Product Expiration Date: 202008182359
ISSUE DATE / TIME: 202007301204
Unit Type and Rh: 6200

## 2019-02-05 MED ORDER — TRAMADOL HCL 50 MG PO TABS
50.0000 mg | ORAL_TABLET | Freq: Two times a day (BID) | ORAL | Status: DC | PRN
  Administered 2019-02-05 – 2019-02-16 (×3): 50 mg via ORAL
  Filled 2019-02-05 (×3): qty 1

## 2019-02-05 NOTE — Progress Notes (Addendum)
PROGRESS NOTE    Cole Nelson  SKA:[id number] DOB: [birth year] DOA: 01/28/2019 PCP: Patient, No Pcp Per   Brief Narrative:  Patient is a 63 year old male with history of ESRD on dialysis, diabetes mellitus, tobacco abuse, diabetes gastroparesis, GERD, hypertension, diastolic heart failure, multiple sclerosis, CVA with residual left-sided weakness, chronic left pleural effusion, chronic anemia who was admitted on 7/11 at [facility] with acute shortness of breath and hypertensive urgency.  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  Patient has been transferred to hospital service.  Currently he is hemodynamically stable.  He is being dialysed and is on antibiotics.  PT/OT evaluated him and recommended CIR on discharge. Patient is medically stable for discharge to CIR as soon as the bed is available.  Assessment & Plan:   Active Problems:   Acute encephalopathy   Pressure injury of skin   Pleural effusion   End-stage renal disease on hemodialysis (HCC)   Protein-calorie malnutrition, severe   Nausea and vomiting   Acute gastric erosion   Abnormal CT scan, small bowel   Acute on chronic pleural effusion: Status post left VATS.  Concern for pleural mass versus infectious consolidation.  Currently he is hemodynamically stable.  Not septic.  Currently on room air.  Blood cultures no growth till date.  Pleural fluid cytology did not show any neoplasia.Repeat CT chest without contrast for follow-up showed chronic large complex left hydropneumothorax (currently in no respiratory distress, off oxygen saturating well). Continue renally dosed vancomycin, Levaquin for total of 7 days and reevaluate.Last day will be tomorrow. The case was discussed with cardiothoracic at [facility] and felt that this is acute consolidation rather than a mass and recommended further  outpatient follow up after completion of antibiotic therapy.  Plan is to follow-up with cardiothoracic surgeons at [facility] for further management once discharged.  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  Tolerating diet.  CT showed no clear evidence of SMA syndrome.  General surgery was following.  GI was also following and underwent enteroscopy on 01/31/2019 which showed food residue in the stomach consistent with gastroparesis, gastric erosions, status post biopsy.  No evidence of inflammation, stricture.  GI recommended gastroparesis diet, Reglan, daily PPI.  ESRD on dialysis: Dialyzed on TTS.  Nephrology following.Next dialysis tomorrow.  Hypertension/diastolic heart failure: Currently euvolemic.  Echo showed hyperdynamic left ventricular ejection fraction.  Blood pressure stable.  Continue his home regimen  Anemia of chronic disease:   He was transfused with 1 unit of PRBC on 02/03/19.Hb this morning is 8.4.  Diabetes type 2: Continue sliding scale insulin.  Home regimen on hold.  Multiple sclerosis: Stable  GERD: Continue PPI  Debility/Deconditioning: Patient evaluated PT and OT and recommended CIR on discharge.  CIR consulted and following.   Nutrition Problem: Severe Malnutrition Etiology: chronic illness(ESRD on HD, CHF)      DVT prophylaxis: heparin [place] Code Status: Full Family Communication: None Disposition Plan: CIR as soon as bed is available   Consultants: PCCM, nephrology, GI  Procedures: Hemodialysis, enteroscopy  Antimicrobials:  Anti-infectives (From admission, onward)   Start     Dose/Rate Route Frequency Ordered Stop   02/02/19 1200  vancomycin (VANCOCIN) IVPB 750 mg/150 ml premix     750 mg 150 mL/hr over 60 Minutes Intravenous Every T-Th-Sa (Hemodialysis) 01/31/19 0846 02/05/19 0649   02/01/19 0800  vancomycin (VANCOCIN) IVPB 750 mg/150 ml  premix     750 mg 150 mL/hr over 60 Minutes Intravenous  Once 02/01/19 0753  02/01/19 1030   01/28/19 0330  levofloxacin (LEVAQUIN) IVPB 500 mg     500 mg 100 mL/hr over 60 Minutes Intravenous Every 48 hours 01/28/19 0323 02/03/19 0451   01/28/19 0323  vancomycin variable dose per unstable renal function (pharmacist dosing)  Status:  Discontinued      Does not apply See admin instructions 01/28/19 0323 02/01/19 1400      Subjective:  Patient seen and examined the bedside this morning.  Denies any complaints.  Sleeping.  No active issues.  Plan for dialysis tomorrow   Objective: Vitals:   02/05/19 0459 02/05/19 0500 02/05/19 0733 02/05/19 0832  BP: 128/61  (!) 152/72   Pulse: 82  77   Resp: 20  18   Temp: 98.5 F (36.9 C)  98.6 F (37 C)   TempSrc: Oral  Oral   SpO2: 94%  97% 99%  Weight:  64.2 kg    Height:        Intake/Output Summary (Last 24 hours) at 02/05/2019 1128 Last data filed at 02/05/2019 0555 Gross per 24 hour  Intake 1241.24 ml  Output 1500 ml  Net -258.76 ml   Filed Weights   02/02/19 2015 02/04/19 0928 02/05/19 0500  Weight: 62.6 kg 68.3 kg 64.2 kg    Examination:  General exam: Deconditioned/debilitated, cachectic HEENT:PERRL,Oral mucosa moist, Ear/Nose normal on gross exam Respiratory system: Decreased air entry on the left side Cardiovascular system: S1 & S2 heard, RRR. No JVD, murmurs, rubs, gallops or clicks. Gastrointestinal system: Abdomen is nondistended, soft and nontender. No organomegaly or masses felt. Normal bowel sounds heard. Central nervous system: Alert and oriented. No focal neurological deficits. Extremities: No edema, no clubbing ,no cyanosis, distal peripheral pulses palpable.  AV graft on the left arm  skin: No rashes, lesions or ulcers,no icterus ,no pallor   Data Reviewed: I have personally reviewed following labs and imaging studies  CBC: Recent Labs  Lab 02/01/19 0719 02/02/19 0555 02/03/19 0535 02/04/19 0814 02/04/19 1452 02/05/19 0533  WBC 6.9 5.3 5.9 5.4  --  8.2  NEUTROABS 4.7 3.5 3.7  3.6  --  5.9  HGB 7.3* 7.5* 7.0* 7.0* 9.0* 8.4*  HCT 23.3* 23.7* 22.8* 22.2* 27.8* 26.2*  MCV 79.5* 78.7* 81.1 78.7*  --  80.9  PLT 265 261 250 250  --  706   Basic Metabolic Panel: Recent Labs  Lab 01/31/19 0747 02/01/19 0719 02/02/19 0555 02/03/19 0535 02/03/19 1117 02/04/19 0814  NA 133* 130* 130* 133*  --  132*  K 3.9 4.1 4.2 3.7  --  3.7  CL 95* 94* 95* 96*  --  95*  CO2 25 23 22 25   --  22  GLUCOSE 100* 219* 115* 95  --  144*  BUN 22 37* 49* 29*  --  39*  CREATININE 4.09* 5.82* 7.03* 4.87*  --  6.54*  CALCIUM 8.7* 8.6* 8.9 8.8*  --  8.9  PHOS  --   --   --   --  3.4  --    GFR: Estimated Creatinine Clearance: 10.6 mL/min (A) (by C-G formula based on SCr of 6.54 mg/dL (H)). Liver Function Tests: Recent Labs  Lab 01/30/19 0511  AST 31  ALT 14  ALKPHOS 62  BILITOT 1.2  PROT 6.7  ALBUMIN 2.4*   No results for input(s): LIPASE, AMYLASE in the last 168 hours. No results for input(s):  AMMONIA in the last 168 hours. Coagulation Profile: No results for input(s): INR, PROTIME in the last 168 hours. Cardiac Enzymes: No results for input(s): CKTOTAL, CKMB, CKMBINDEX, TROPONINI in the last 168 hours. BNP (last 3 results) No results for input(s): PROBNP in the last 8760 hours. HbA1C: No results for input(s): HGBA1C in the last 72 hours. CBG: Recent Labs  Lab 02/04/19 1632 02/04/19 1956 02/04/19 2341 02/05/19 0348 02/05/19 0730  GLUCAP 138* 102* 208* 114* 132*   Lipid Profile: No results for input(s): CHOL, HDL, LDLCALC, TRIG, CHOLHDL, LDLDIRECT in the last 72 hours. Thyroid Function Tests: No results for input(s): TSH, T4TOTAL, FREET4, T3FREE, THYROIDAB in the last 72 hours. Anemia Panel: Recent Labs    02/03/19 1117  FERRITIN 1,429*  TIBC 169*  IRON 75   Sepsis Labs: No results for input(s): PROCALCITON, LATICACIDVEN in the last 168 hours.  Recent Results (from the past 240 hour(s))  MRSA PCR Screening     Status: None   Collection Time: 01/28/19  12:22 AM   Specimen: Nasopharyngeal  Result Value Ref Range Status   MRSA by PCR NEGATIVE NEGATIVE Final    Comment:        The GeneXpert MRSA Assay (FDA approved for NASAL specimens only), is one component of a comprehensive MRSA colonization surveillance program. It is not intended to diagnose MRSA infection nor to guide or monitor treatment for MRSA infections. Performed at [facility], [place] [address], [place], [place] [address]   Culture, blood (routine x 2)     Status: None   Collection Time: 01/28/19  3:18 AM   Specimen: BLOOD  Result Value Ref Range Status   Specimen Description BLOOD RIGHT ARM  Final   Special Requests   Final    BOTTLES DRAWN AEROBIC ONLY Blood Culture results may not be optimal due to an inadequate volume of blood received in culture bottles   Culture   Final    NO GROWTH 5 DAYS Performed at [facility], [place] [address] [place], [place] [address]    Report Status 02/02/2019 FINAL  Final  Culture, blood (routine x 2)     Status: None   Collection Time: 01/28/19  3:20 AM   Specimen: BLOOD  Result Value Ref Range Status   Specimen Description BLOOD RIGHT ARM  Final   Special Requests   Final    BOTTLES DRAWN AEROBIC ONLY Blood Culture results may not be optimal due to an inadequate volume of blood received in culture bottles   Culture   Final    NO GROWTH 5 DAYS Performed at [facility], [place] [address] [place], [place] [address]    Report Status 02/02/2019 FINAL  Final  SARS Coronavirus 2 (CEPHEID - Performed in [place] hospital lab), Hosp Order     Status: None   Collection Time: 01/28/19  8:58 AM   Specimen: Nasopharyngeal Swab  Result Value Ref Range Status   SARS Coronavirus 2 NEGATIVE NEGATIVE Final    Comment: (NOTE) If result is NEGATIVE SARS-CoV-2 target nucleic acids are NOT DETECTED. The SARS-CoV-2 RNA is generally detectable in upper and lower  respiratory specimens during the acute phase of infection. The  lowest  concentration of SARS-CoV-2 viral copies this assay can detect is 250  copies / mL. A negative result does not preclude SARS-CoV-2 infection  and should not be used as the sole basis for treatment or other  patient management decisions.  A negative result may  occur with  improper specimen collection / handling, submission of specimen other  than nasopharyngeal swab, presence of viral mutation(s) within the  areas targeted by this assay, and inadequate number of viral copies  (<250 copies / mL). A negative result must be combined with clinical  observations, patient history, and epidemiological information. If result is POSITIVE SARS-CoV-2 target nucleic acids are DETECTED. The SARS-CoV-2 RNA is generally detectable in upper and lower  respiratory specimens dur ing the acute phase of infection.  Positive  results are indicative of active infection with SARS-CoV-2.  Clinical  correlation with patient history and other diagnostic information is  necessary to determine patient infection status.  Positive results do  not rule out bacterial infection or co-infection with other viruses. If result is PRESUMPTIVE POSTIVE SARS-CoV-2 nucleic acids MAY BE PRESENT.   A presumptive positive result was obtained on the submitted specimen  and confirmed on repeat testing.  While 2019 novel coronavirus  (SARS-CoV-2) nucleic acids may be present in the submitted sample  additional confirmatory testing may be necessary for epidemiological  and / or clinical management purposes  to differentiate between  SARS-CoV-2 and other Sarbecovirus currently known to infect humans.  If clinically indicated additional testing with an alternate test  methodology [phone]) is advised. The SARS-CoV-2 RNA is generally  detectable in upper and lower respiratory sp ecimens during the acute  phase of infection. The expected result is Negative. Fact Sheet for Patients:   StrictlyIdeas.no Fact Sheet for Healthcare Providers: BankingDealers.co.za This test is not yet approved or cleared by the [place] FDA and has been authorized for detection and/or diagnosis of SARS-CoV-2 by FDA under an Emergency Use Authorization (EUA).  This EUA will remain in effect (meaning this test can be used) for the duration of the COVID-19 declaration under Section 564(b)(1) of the Act, 21 U.S.C. section 360bbb-3(b)(1), unless the authorization is terminated or revoked sooner. Performed at [facility], [place] [address], [place], [place] [address]          Radiology Studies: No results found.      Scheduled Meds:  amLODipine  5 mg Oral BID   aspirin  81 mg Oral Daily   budesonide  0.25 mg Nebulization BID   carvedilol  25 mg Oral BID   Chlorhexidine Gluconate Cloth  6 each Topical Daily   Chlorhexidine Gluconate Cloth  6 each Topical Q0600   cloNIDine  0.3 mg Oral BID   darbepoetin (ARANESP) injection - DIALYSIS  60 mcg Intravenous Q Tue-HD   feeding supplement (NEPRO CARB STEADY)  237 mL Oral BID BM   feeding supplement (PRO-STAT SUGAR FREE 64)  30 mL Oral BID   ferrous sulfate  325 mg Oral Daily   FLUoxetine  20 mg Oral Daily   heparin  5,000 Units Subcutaneous Q8H   hydrALAZINE  100 mg Oral TID   insulin aspart  0-9 Units Subcutaneous Q4H   mouth rinse  15 mL Mouth Rinse BID   metoCLOPramide (REGLAN) injection  10 mg Intravenous TID AC & HS   montelukast  10 mg Oral Daily   multivitamin  1 tablet Oral QHS   nystatin  5 mL Oral QID   pantoprazole  40 mg Oral Q0600   Continuous Infusions:  sodium chloride 250 mL (02/01/19 0928)     LOS: 8 days    Time spent: 35 mins.More than 50% of that time was spent in counseling and/or coordination of care.      Firmin Belisle  Tawanna Solo, MD [facility] Pager [phone]  If 7PM-7AM, please contact  night-coverage [email or web address] Password [facility] 02/05/2019, 11:28 AM

## 2019-02-05 NOTE — Progress Notes (Signed)
Patient ID: Cole Nelson, male   DOB: 1955-12-27, 63 y.o.   MRN: [id number] [facility] Progress Note   Assessment/ Plan:   1.  Acute on chronic pleural effusion status post VATS: Status post completion of antibiotic course with Levaquin and vancomycin, currently afebrile and without respiratory compromise.  Plans noted for outpatient follow-up with [facility] thoracic surgery. 2. ESRD: Continue hemodialysis on a TTS schedule with next dialysis due tomorrow; euvolemic and without acute indications for dialysis.  Plans noted for possible admission to CIR. 3. Anemia: No overt loss with slight downtrend of hemoglobin and hematocrit and status post Aranesp earlier this week.  Iron stores replete. 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 5. 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 6. Hypertension: Blood pressure noted, monitor with hemodialysis/UF.  Subjective:   Reports to be feeling fair, states that he is waiting for transfer to rehab.   Objective:   BP (!) 152/72 (BP Location: Right Arm)   Pulse 77   Temp 98.6 F (37 C) (Oral)   Resp 18   Ht 5\' 11"  (1.803 m)   Wt 64.2 kg   SpO2 99%   BMI 19.74 kg/m   Physical Exam: Gen: Appears comfortable resting in bed, watching television CVS: Pulse regular rhythm, normal rate, S1 and S2 normal Resp: Clear to auscultation, no rales/rhonchi Abd: Soft, flat, nontender Ext: Left upper arm AV fistula with positive thrill.  No pedal edema  Labs: BMET Recent Labs  Lab 01/30/19 0511 01/31/19 0747 02/01/19 0719 02/02/19 0555 02/03/19 0535 02/03/19 1117 02/04/19 0814  NA 133* 133* 130* 130* 133*  --  132*  K 3.4* 3.9 4.1 4.2 3.7  --  3.7  CL 94* 95* 94* 95* 96*  --  95*  CO2 24 25 23 22 25   --  22  GLUCOSE 120* 100* 219* 115* 95  --  144*  BUN 38* 22 37* 49* 29*  --  39*  CREATININE 5.93* 4.09* 5.82* 7.03* 4.87*  --  6.54*   CALCIUM 8.5* 8.7* 8.6* 8.9 8.8*  --  8.9  PHOS  --   --   --   --   --  3.4  --    CBC Recent Labs  Lab 02/02/19 0555 02/03/19 0535 02/04/19 0814 02/04/19 1452 02/05/19 0533  WBC 5.3 5.9 5.4  --  8.2  NEUTROABS 3.5 3.7 3.6  --  5.9  HGB 7.5* 7.0* 7.0* 9.0* 8.4*  HCT 23.7* 22.8* 22.2* 27.8* 26.2*  MCV 78.7* 81.1 78.7*  --  80.9  PLT 261 250 250  --  249   Medications:    . amLODipine  5 mg Oral BID  . aspirin  81 mg Oral Daily  . budesonide  0.25 mg Nebulization BID  . carvedilol  25 mg Oral BID  . Chlorhexidine Gluconate Cloth  6 each Topical Daily  . Chlorhexidine Gluconate Cloth  6 each Topical Q0600  . cloNIDine  0.3 mg Oral BID  . darbepoetin (ARANESP) injection - DIALYSIS  60 mcg Intravenous Q Tue-HD  . feeding supplement (NEPRO CARB STEADY)  237 mL Oral BID BM  . feeding supplement (PRO-STAT SUGAR FREE 64)  30 mL Oral BID  . ferrous sulfate  325 mg Oral Daily  . FLUoxetine  20 mg Oral Daily  . heparin  5,000 Units Subcutaneous Q8H  . hydrALAZINE  100 mg Oral TID  . insulin aspart  0-9 Units Subcutaneous  Q4H  . mouth rinse  15 mL Mouth Rinse BID  . metoCLOPramide (REGLAN) injection  10 mg Intravenous TID AC & HS  . montelukast  10 mg Oral Daily  . multivitamin  1 tablet Oral QHS  . nystatin  5 mL Oral QID  . pantoprazole  40 mg Oral Q0600   Elmarie Shiley, MD 02/05/2019, 9:57 AM

## 2019-02-05 NOTE — Plan of Care (Signed)
  Problem: Safety: Goal: Ability to remain free from injury will improve Outcome: Progressing   

## 2019-02-05 NOTE — Plan of Care (Signed)
  Problem: Activity: Goal: Risk for activity intolerance will decrease Outcome: Progressing   

## 2019-02-05 NOTE — Progress Notes (Signed)
PT Cancellation Note  Patient Details Name: Cole Nelson MRN: [id number] DOB: 1956-06-30   Cancelled Treatment:    Reason Eval/Treat Not Completed: Fatigue/lethargy limiting ability to participate.  Declined OOB and did not want food that arrived for lunch.  Will re-attempt at another time.   Ramond Dial 02/05/2019, 11:37 AM   Mee Hives, PT MS Acute Rehab Dept. Number: [place] and [place]

## 2019-02-05 NOTE — Progress Notes (Signed)
Occupational Therapy Treatment Patient Details Name: Cole Nelson MRN: [id number] DOB: 07-13-1955 Today's Date: 02/05/2019    History of present illness 63 year old male with history of ESRD on TTS- iHD via LUE AVF, DM, tobacco abuse, diabetic gastroparesis, GERD, HTN, diastolic HF, multiple sclerosis, CVA w/residual left sided weakness, chronic left pleural effusion, hx cdiff, diabetic neuropathy, anemia, HLD, and depression, was admitted on 7/11 to [facility] with acute shortness of breath and hypertensive urgency.  Found on admit to have complete opacification of left hemithorax. Acute on chronic pleural effusions s/p left VATS, concern for pleural mass vs infectious consolidation   OT comments  Pt progressing toward established goals. Pt currently requires minA for LB ADL and minA for functional mobility at RW level. Pt demonstrates decreased activity tolerance, required 1 seated rest break after walking width of the room. pt with reported dizziness subsided after 15second seated rest break. Pt will continue to benefit from skilled OT services to maximize safety and independence with ADL/IADL and functional mobility. Will continue to follow acutely and progress as tolerated.      Follow Up Recommendations  CIR    Equipment Recommendations  3 in 1 bedside commode    Recommendations for Other Services Rehab consult    Precautions / Restrictions Precautions Precautions: Fall Restrictions Weight Bearing Restrictions: No       Mobility Bed Mobility Overal bed mobility: Needs Assistance Bed Mobility: Supine to Sit     Supine to sit: Min assist     General bed mobility comments: minA to progress trunk to upright position  Transfers Overall transfer level: Needs assistance Equipment used: Rolling walker (2 wheeled) Transfers: Sit to/from Stand Sit to Stand: Min assist         General transfer comment: heavy minA to powerup into standing;pt with increased  effort and time;cues for safe hand placement    Balance Overall balance assessment: Needs assistance Sitting-balance support: Feet supported Sitting balance-Leahy Scale: Fair     Standing balance support: Bilateral upper extremity supported Standing balance-Leahy Scale: Poor Standing balance comment: reliant on Bil. UE support                            ADL either performed or assessed with clinical judgement   ADL Overall ADL's : Needs assistance/impaired Eating/Feeding: Set up;Sitting   Grooming: Minimal assistance;Standing               Lower Body Dressing: Minimal assistance;Sit to/from stand Lower Body Dressing Details (indicate cue type and reason): minA to don socks over toes, pt able to complete the rest.  Toilet Transfer: Minimal assistance;Ambulation;RW Toilet Transfer Details (indicate cue type and reason): simulated in room sit<>stand from/return to EOB with in room mobility;pt required 1 seated rest break during ambulation Toileting- Clothing Manipulation and Hygiene: Minimal assistance;Sit to/from stand       Functional mobility during ADLs: Minimal assistance;Rolling walker General ADL Comments: pt with decreased activity tolerance, required 1 seated rest break after walking width of the room. pt with reported dizziness subsided after 15second seated rest break     Vision       Perception     Praxis      Cognition Arousal/Alertness: Awake/alert Behavior During Therapy: WFL for tasks assessed/performed Overall Cognitive Status: Impaired/Different from baseline Area of Impairment: Attention;Safety/judgement;Awareness;Problem solving                 Orientation Level: Disoriented to;Place;Time;Situation Current Attention  Level: Sustained   Following Commands: Follows one step commands consistently Safety/Judgement: Decreased awareness of safety;Decreased awareness of deficits Awareness: Intellectual Problem Solving: Slow  processing;Difficulty sequencing;Requires verbal cues;Requires tactile cues General Comments: Pt required frequent vc fro safety;pt demonstrated increased distractability with TV and commotion outside room        Exercises     Shoulder Instructions       General Comments HR 83bpm-87bpm with functional mobiltiy     Pertinent Vitals/ Pain       Pain Assessment: No/denies pain  Home Living                                          Prior Functioning/Environment              Frequency  Min 2X/week        Progress Toward Goals  OT Goals(current goals can now be found in the care plan section)  Progress towards OT goals: Progressing toward goals  Acute Rehab OT Goals Patient Stated Goal: to go to CIR OT Goal Formulation: With patient Time For Goal Achievement: 02/15/19 Potential to Achieve Goals: Good ADL Goals Pt Will Perform Grooming: with min assist;standing Pt Will Perform Lower Body Bathing: with min assist;sit to/from stand Pt Will Perform Lower Body Dressing: with min assist;sit to/from stand Pt Will Transfer to Toilet: with min assist;ambulating;regular height toilet Pt Will Perform Toileting - Clothing Manipulation and hygiene: with min assist;sit to/from stand  Plan Discharge plan remains appropriate    Co-evaluation                 AM-PAC OT "6 Clicks" Daily Activity     Outcome Measure   Help from another person eating meals?: A Little Help from another person taking care of personal grooming?: A Little Help from another person toileting, which includes using toliet, bedpan, or urinal?: A Little Help from another person bathing (including washing, rinsing, drying)?: A Little Help from another person to put on and taking off regular upper body clothing?: A Little Help from another person to put on and taking off regular lower body clothing?: A Little 6 Click Score: 18    End of Session Equipment Utilized During Treatment:  Rolling walker;Gait belt  OT Visit Diagnosis: Unsteadiness on feet (R26.81)   Activity Tolerance Patient tolerated treatment well   Patient Left in bed;with call bell/phone within reach;with bed alarm set   Nurse Communication Mobility status        Time: 3976-7341 OT Time Calculation (min): 17 min  Charges: OT General Charges $OT Visit: 1 Visit OT Treatments $Self Care/Home Management : 8-22 mins  Dorinda Hill OTR/L Acute Rehabilitation Services Office: [phone]    Wyn Forster 02/05/2019, 4:13 PM

## 2019-02-06 LAB — GLUCOSE, CAPILLARY
Glucose-Capillary: 100 mg/dL — ABNORMAL HIGH (ref 70–99)
Glucose-Capillary: 163 mg/dL — ABNORMAL HIGH (ref 70–99)
Glucose-Capillary: 164 mg/dL — ABNORMAL HIGH (ref 70–99)
Glucose-Capillary: 164 mg/dL — ABNORMAL HIGH (ref 70–99)
Glucose-Capillary: 75 mg/dL (ref 70–99)
Glucose-Capillary: 78 mg/dL (ref 70–99)

## 2019-02-06 NOTE — Plan of Care (Signed)
  Problem: Activity: Goal: Risk for activity intolerance will decrease Outcome: Progressing   Problem: Nutrition: Goal: Adequate nutrition will be maintained Outcome: Progressing   

## 2019-02-06 NOTE — Progress Notes (Signed)
PROGRESS NOTE    Cole Nelson  AUQ:[id number] DOB: [birth year] DOA: 01/28/2019 PCP: Patient, No Pcp Per   Brief Narrative:  Patient is a 63 year old male with history of ESRD on dialysis, diabetes mellitus, tobacco abuse, diabetes gastroparesis, GERD, hypertension, diastolic heart failure, multiple sclerosis, CVA with residual left-sided weakness, chronic left pleural effusion, chronic anemia who was admitted on 7/11 at [facility] with acute shortness of breath and hypertensive urgency.  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  Patient has been transferred to hospital service.  Currently he is hemodynamically stable.  He is being dialysed   PT/OT evaluated him and recommended CIR on discharge. Patient is medically stable for discharge to CIR as soon as the bed is available.  Assessment & Plan:   Active Problems:   Acute encephalopathy   Pressure injury of skin   Pleural effusion   End-stage renal disease on hemodialysis (HCC)   Protein-calorie malnutrition, severe   Nausea and vomiting   Acute gastric erosion   Abnormal CT scan, small bowel   Acute on chronic pleural effusion: Status post left VATS.  Concern for pleural mass versus infectious consolidation.  Currently he is hemodynamically stable.  Not septic.  Currently on room air.  Blood cultures no growth till date.  Pleural fluid cytology did not show any neoplasia.Repeat CT chest without contrast for follow-up showed chronic large complex left hydropneumothorax (currently in no respiratory distress, off oxygen saturating well). Completed antibiotics course. The case was discussed with cardiothoracic at [facility] and felt that this is acute consolidation rather than a mass and recommended further outpatient follow up after completion of antibiotic therapy.  Plan is to follow-up with cardiothoracic  surgeons at [facility] thoracic surgery) for further management once discharged.  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  Tolerating diet.  CT showed no clear evidence of SMA syndrome.  General surgery was following.  GI was also following and underwent enteroscopy on 01/31/2019 which showed food residue in the stomach consistent with gastroparesis, gastric erosions, status post biopsy.  No evidence of inflammation, stricture.  GI recommended gastroparesis diet, Reglan, daily PPI.  ESRD on dialysis: Dialyzed on TTS.  Nephrology following.  Hypertension/diastolic heart failure: Currently euvolemic.  Echo showed hyperdynamic left ventricular ejection fraction.  Blood pressure stable.  Continue his home regimen  Anemia of chronic disease:   He was transfused with 1 unit of PRBC on 02/03/19.Hb stable  Diabetes type 2: Continue sliding scale insulin.  Home regimen on hold.  Multiple sclerosis: Stable  GERD: Continue PPI  Debility/Deconditioning: Patient evaluated PT and OT and recommended CIR on discharge.  CIR consulted and following.   Nutrition Problem: Severe Malnutrition Etiology: chronic illness(ESRD on HD, CHF)      DVT prophylaxis: heparin [place] Code Status: Full Family Communication: None Disposition Plan: CIR as soon as bed is available   Consultants: PCCM, nephrology, GI  Procedures: Hemodialysis, enteroscopy  Antimicrobials:  Anti-infectives (From admission, onward)   Start     Dose/Rate Route Frequency Ordered Stop   02/02/19 1200  vancomycin (VANCOCIN) IVPB 750 mg/150 ml premix     750 mg 150 mL/hr over 60 Minutes Intravenous Every T-Th-Sa (Hemodialysis) 01/31/19 0846 02/05/19 0649   02/01/19 0800  vancomycin (VANCOCIN) IVPB 750 mg/150 ml premix     750 mg 150 mL/hr over 60 Minutes Intravenous  Once 02/01/19 0753 02/01/19 1030  01/28/19 0330  levofloxacin (LEVAQUIN) IVPB 500 mg     500 mg 100 mL/hr over 60 Minutes Intravenous  Every 48 hours 01/28/19 0323 02/03/19 0451   01/28/19 0323  vancomycin variable dose per unstable renal function (pharmacist dosing)  Status:  Discontinued      Does not apply See admin instructions 01/28/19 0323 02/01/19 1400      Subjective:  Patient seen and examined the bedside this morning.  Hemodynamically stable.  Sleeping.   Objective: Vitals:   02/05/19 2100 02/06/19 0414 02/06/19 0930 02/06/19 0944  BP:  (!) 179/71  (!) 172/68  Pulse: 90 71  73  Resp: 20 18    Temp:  98.5 F (36.9 C)  98.6 F (37 C)  TempSrc:  Oral  Oral  SpO2: 95% 100% 93% 98%  Weight:  65 kg    Height:        Intake/Output Summary (Last 24 hours) at 02/06/2019 1123 Last data filed at 02/06/2019 0850 Gross per 24 hour  Intake 480 ml  Output 0 ml  Net 480 ml   Filed Weights   02/04/19 0928 02/05/19 0500 02/06/19 0414  Weight: 68.3 kg 64.2 kg 65 kg    Examination:  General exam: Deconditioned/debilitated, cachectic  HEENT: Ear/Nose normal on gross exam Respiratory system: Decreased air entry on the left side Cardiovascular system: S1 & S2 heard, RRR. No JVD, murmurs, rubs, gallops or clicks. Gastrointestinal system: Abdomen is nondistended, soft and nontender. No organomegaly or masses felt. Normal bowel sounds heard. Central nervous system: Alert and oriented. No focal neurological deficits. Extremities: No edema, no clubbing ,no cyanosis, distal peripheral pulses palpable.  AV graft on the left arm  skin: No rashes, lesions or ulcers,no icterus ,no pallor   Data Reviewed: I have personally reviewed following labs and imaging studies  CBC: Recent Labs  Lab 02/01/19 0719 02/02/19 0555 02/03/19 0535 02/04/19 0814 02/04/19 1452 02/05/19 0533  WBC 6.9 5.3 5.9 5.4  --  8.2  NEUTROABS 4.7 3.5 3.7 3.6  --  5.9  HGB 7.3* 7.5* 7.0* 7.0* 9.0* 8.4*  HCT 23.3* 23.7* 22.8* 22.2* 27.8* 26.2*  MCV 79.5* 78.7* 81.1 78.7*  --  80.9  PLT 265 261 250 250  --  017   Basic Metabolic Panel: Recent  Labs  Lab 01/31/19 0747 02/01/19 0719 02/02/19 0555 02/03/19 0535 02/03/19 1117 02/04/19 0814  NA 133* 130* 130* 133*  --  132*  K 3.9 4.1 4.2 3.7  --  3.7  CL 95* 94* 95* 96*  --  95*  CO2 25 23 22 25   --  22  GLUCOSE 100* 219* 115* 95  --  144*  BUN 22 37* 49* 29*  --  39*  CREATININE 4.09* 5.82* 7.03* 4.87*  --  6.54*  CALCIUM 8.7* 8.6* 8.9 8.8*  --  8.9  PHOS  --   --   --   --  3.4  --    GFR: Estimated Creatinine Clearance: 10.8 mL/min (A) (by C-G formula based on SCr of 6.54 mg/dL (H)). Liver Function Tests: No results for input(s): AST, ALT, ALKPHOS, BILITOT, PROT, ALBUMIN in the last 168 hours. No results for input(s): LIPASE, AMYLASE in the last 168 hours. No results for input(s): AMMONIA in the last 168 hours. Coagulation Profile: No results for input(s): INR, PROTIME in the last 168 hours. Cardiac Enzymes: No results for input(s): CKTOTAL, CKMB, CKMBINDEX, TROPONINI in the last 168 hours. BNP (last 3 results) No results for input(s):  PROBNP in the last 8760 hours. HbA1C: No results for input(s): HGBA1C in the last 72 hours. CBG: Recent Labs  Lab 02/05/19 1642 02/05/19 2133 02/06/19 0029 02/06/19 0418 02/06/19 0738  GLUCAP 122* 181* 78 164* 75   Lipid Profile: No results for input(s): CHOL, HDL, LDLCALC, TRIG, CHOLHDL, LDLDIRECT in the last 72 hours. Thyroid Function Tests: No results for input(s): TSH, T4TOTAL, FREET4, T3FREE, THYROIDAB in the last 72 hours. Anemia Panel: No results for input(s): VITAMINB12, FOLATE, FERRITIN, TIBC, IRON, RETICCTPCT in the last 72 hours. Sepsis Labs: No results for input(s): PROCALCITON, LATICACIDVEN in the last 168 hours.  Recent Results (from the past 240 hour(s))  MRSA PCR Screening     Status: None   Collection Time: 01/28/19 12:22 AM   Specimen: Nasopharyngeal  Result Value Ref Range Status   MRSA by PCR NEGATIVE NEGATIVE Final    Comment:        The GeneXpert MRSA Assay (FDA approved for NASAL specimens  only), is one component of a comprehensive MRSA colonization surveillance program. It is not intended to diagnose MRSA infection nor to guide or monitor treatment for MRSA infections. Performed at [facility], [place] [address] [place], [place] [address]   Culture, blood (routine x 2)     Status: None   Collection Time: 01/28/19  3:18 AM   Specimen: BLOOD  Result Value Ref Range Status   Specimen Description BLOOD RIGHT ARM  Final   Special Requests   Final    BOTTLES DRAWN AEROBIC ONLY Blood Culture results may not be optimal due to an inadequate volume of blood received in culture bottles   Culture   Final    NO GROWTH 5 DAYS Performed at [facility], [place] [address], [place], [place] [address]    Report Status 02/02/2019 FINAL  Final  Culture, blood (routine x 2)     Status: None   Collection Time: 01/28/19  3:20 AM   Specimen: BLOOD  Result Value Ref Range Status   Specimen Description BLOOD RIGHT ARM  Final   Special Requests   Final    BOTTLES DRAWN AEROBIC ONLY Blood Culture results may not be optimal due to an inadequate volume of blood received in culture bottles   Culture   Final    NO GROWTH 5 DAYS Performed at [facility], [place] [address] [place], [place] [address]    Report Status 02/02/2019 FINAL  Final  SARS Coronavirus 2 (CEPHEID - Performed in [place] hospital lab), Hosp Order     Status: None   Collection Time: 01/28/19  8:58 AM   Specimen: Nasopharyngeal Swab  Result Value Ref Range Status   SARS Coronavirus 2 NEGATIVE NEGATIVE Final    Comment: (NOTE) If result is NEGATIVE SARS-CoV-2 target nucleic acids are NOT DETECTED. The SARS-CoV-2 RNA is generally detectable in upper and lower  respiratory specimens during the acute phase of infection. The lowest  concentration of SARS-CoV-2 viral copies this assay can detect is 250  copies / mL. A negative result does not preclude SARS-CoV-2 infection  and should not be used as the sole  basis for treatment or other  patient management decisions.  A negative result may occur with  improper specimen collection / handling, submission of specimen other  than nasopharyngeal swab, presence of viral mutation(s) within the  areas targeted by this assay, and inadequate number of viral copies  (<250 copies / mL). A negative result must be combined  with clinical  observations, patient history, and epidemiological information. If result is POSITIVE SARS-CoV-2 target nucleic acids are DETECTED. The SARS-CoV-2 RNA is generally detectable in upper and lower  respiratory specimens dur ing the acute phase of infection.  Positive  results are indicative of active infection with SARS-CoV-2.  Clinical  correlation with patient history and other diagnostic information is  necessary to determine patient infection status.  Positive results do  not rule out bacterial infection or co-infection with other viruses. If result is PRESUMPTIVE POSTIVE SARS-CoV-2 nucleic acids MAY BE PRESENT.   A presumptive positive result was obtained on the submitted specimen  and confirmed on repeat testing.  While 2019 novel coronavirus  (SARS-CoV-2) nucleic acids may be present in the submitted sample  additional confirmatory testing may be necessary for epidemiological  and / or clinical management purposes  to differentiate between  SARS-CoV-2 and other Sarbecovirus currently known to infect humans.  If clinically indicated additional testing with an alternate test  methodology [phone]) is advised. The SARS-CoV-2 RNA is generally  detectable in upper and lower respiratory sp ecimens during the acute  phase of infection. The expected result is Negative. Fact Sheet for Patients:  StrictlyIdeas.no Fact Sheet for Healthcare Providers: BankingDealers.co.za This test is not yet approved or cleared by the [place] FDA and has been authorized for detection  and/or diagnosis of SARS-CoV-2 by FDA under an Emergency Use Authorization (EUA).  This EUA will remain in effect (meaning this test can be used) for the duration of the COVID-19 declaration under Section 564(b)(1) of the Act, 21 U.S.C. section 360bbb-3(b)(1), unless the authorization is terminated or revoked sooner. Performed at [facility], [place] [address], [place], [place] [address]          Radiology Studies: No results found.      Scheduled Meds: . amLODipine  5 mg Oral BID  . aspirin  81 mg Oral Daily  . budesonide  0.25 mg Nebulization BID  . carvedilol  25 mg Oral BID  . Chlorhexidine Gluconate Cloth  6 each Topical Daily  . Chlorhexidine Gluconate Cloth  6 each Topical Q0600  . cloNIDine  0.3 mg Oral BID  . darbepoetin (ARANESP) injection - DIALYSIS  60 mcg Intravenous Q Tue-HD  . feeding supplement (NEPRO CARB STEADY)  237 mL Oral BID BM  . feeding supplement (PRO-STAT SUGAR FREE 64)  30 mL Oral BID  . ferrous sulfate  325 mg Oral Daily  . FLUoxetine  20 mg Oral Daily  . heparin  5,000 Units Subcutaneous Q8H  . hydrALAZINE  100 mg Oral TID  . insulin aspart  0-9 Units Subcutaneous Q4H  . mouth rinse  15 mL Mouth Rinse BID  . metoCLOPramide (REGLAN) injection  10 mg Intravenous TID AC & HS  . montelukast  10 mg Oral Daily  . multivitamin  1 tablet Oral QHS  . nystatin  5 mL Oral QID  . pantoprazole  40 mg Oral Q0600   Continuous Infusions: . sodium chloride 250 mL (02/01/19 0928)     LOS: 9 days    Time spent: 35 mins.More than 50% of that time was spent in counseling and/or coordination of care.      Shelly Coss, MD [facility] Pager [phone]  If 7PM-7AM, please contact night-coverage [email or web address] Password [email or web address] 02/06/2019, 11:23 AM

## 2019-02-06 NOTE — Plan of Care (Signed)
°  Problem: Coping: °Goal: Level of anxiety will decrease °Outcome: Progressing °  °

## 2019-02-06 NOTE — Progress Notes (Signed)
Patient ID: Cole Nelson, male   DOB: [birth year], 63 y.o.   MRN: [id number] [facility] Progress Note   Assessment/ Plan:   1.  Acute on chronic pleural effusion status post VATS: Status post completion of antibiotics with Levaquin and vancomycin, remains afebrile with stable respiratory status.  Plans noted for outpatient follow-up with [facility] thoracic surgery. 2. ESRD: Continue hemodialysis on a TTS schedule with next dialysis due later today; he is euvolemic on exam and does not have any uremic type symptoms or signs.  Plans noted for possible admission to CIR. 3. Anemia: Iron stores are replete and he does not have any overt blood loss.  Await CBC to monitor trend. 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 5. 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 6. Hypertension: Blood pressure elevated this morning, monitor with hemodialysis/UF and titrate antihypertensive therapy if postdialysis blood pressures remain elevated.  Subjective:   Denies any acute complaints overnight.  Not feeling too hungry this morning.   Objective:   BP (!) 172/68 (BP Location: Right Arm)   Pulse 73   Temp 98.6 F (37 C) (Oral)   Resp 18   Ht 5\' 11"  (1.803 m)   Wt 65 kg   SpO2 98%   BMI 19.99 kg/m   Physical Exam: Gen: Notably sleeping in bed, easy to awaken and engage in conversation.  Breakfast largely untouched. CVS: Pulse regular rhythm, normal rate, S1 and S2 normal Resp: Clear to auscultation, no rales/rhonchi Abd: Soft, flat, nontender Ext: Left upper arm AV fistula with positive thrill.  No pedal edema  Labs: BMET Recent Labs  Lab 01/31/19 0747 02/01/19 0719 02/02/19 0555 02/03/19 0535 02/03/19 1117 02/04/19 0814  NA 133* 130* 130* 133*  --  132*  K 3.9 4.1 4.2 3.7  --  3.7  CL 95* 94* 95* 96*  --  95*  CO2 25 23 22 25   --  22  GLUCOSE 100* 219* 115* 95  --  144*   BUN 22 37* 49* 29*  --  39*  CREATININE 4.09* 5.82* 7.03* 4.87*  --  6.54*  CALCIUM 8.7* 8.6* 8.9 8.8*  --  8.9  PHOS  --   --   --   --  3.4  --    CBC Recent Labs  Lab 02/02/19 0555 02/03/19 0535 02/04/19 0814 02/04/19 1452 02/05/19 0533  WBC 5.3 5.9 5.4  --  8.2  NEUTROABS 3.5 3.7 3.6  --  5.9  HGB 7.5* 7.0* 7.0* 9.0* 8.4*  HCT 23.7* 22.8* 22.2* 27.8* 26.2*  MCV 78.7* 81.1 78.7*  --  80.9  PLT 261 250 250  --  249   Medications:    . amLODipine  5 mg Oral BID  . aspirin  81 mg Oral Daily  . budesonide  0.25 mg Nebulization BID  . carvedilol  25 mg Oral BID  . Chlorhexidine Gluconate Cloth  6 each Topical Daily  . Chlorhexidine Gluconate Cloth  6 each Topical Q0600  . cloNIDine  0.3 mg Oral BID  . darbepoetin (ARANESP) injection - DIALYSIS  60 mcg Intravenous Q Tue-HD  . feeding supplement (NEPRO CARB STEADY)  237 mL Oral BID BM  . feeding supplement (PRO-STAT SUGAR FREE 64)  30 mL Oral BID  . ferrous sulfate  325 mg Oral Daily  . FLUoxetine  20 mg Oral Daily  . heparin  5,000 Units Subcutaneous Q8H  . hydrALAZINE  100  mg Oral TID  . insulin aspart  0-9 Units Subcutaneous Q4H  . mouth rinse  15 mL Mouth Rinse BID  . metoCLOPramide (REGLAN) injection  10 mg Intravenous TID AC & HS  . montelukast  10 mg Oral Daily  . multivitamin  1 tablet Oral QHS  . nystatin  5 mL Oral QID  . pantoprazole  40 mg Oral Q0600   Elmarie Shiley, MD 02/06/2019, 10:15 AM

## 2019-02-07 LAB — CBC WITH DIFFERENTIAL/PLATELET
Abs Immature Granulocytes: 0.05 10*3/uL (ref 0.00–0.07)
Basophils Absolute: 0.1 10*3/uL (ref 0.0–0.1)
Basophils Relative: 1 %
Eosinophils Absolute: 0.3 10*3/uL (ref 0.0–0.5)
Eosinophils Relative: 2 %
HCT: 29.6 % — ABNORMAL LOW (ref 39.0–52.0)
Hemoglobin: 9.3 g/dL — ABNORMAL LOW (ref 13.0–17.0)
Immature Granulocytes: 0 %
Lymphocytes Relative: 5 %
Lymphs Abs: 0.6 10*3/uL — ABNORMAL LOW (ref 0.7–4.0)
MCH: 25.8 pg — ABNORMAL LOW (ref 26.0–34.0)
MCHC: 31.4 g/dL (ref 30.0–36.0)
MCV: 82.2 fL (ref 80.0–100.0)
Monocytes Absolute: 1 10*3/uL (ref 0.1–1.0)
Monocytes Relative: 7 %
Neutro Abs: 11.2 10*3/uL — ABNORMAL HIGH (ref 1.7–7.7)
Neutrophils Relative %: 85 %
Platelets: 276 10*3/uL (ref 150–400)
RBC: 3.6 MIL/uL — ABNORMAL LOW (ref 4.22–5.81)
RDW: 16 % — ABNORMAL HIGH (ref 11.5–15.5)
WBC: 13.2 10*3/uL — ABNORMAL HIGH (ref 4.0–10.5)
nRBC: 0 % (ref 0.0–0.2)

## 2019-02-07 LAB — RENAL FUNCTION PANEL
Albumin: 2.5 g/dL — ABNORMAL LOW (ref 3.5–5.0)
Anion gap: 15 (ref 5–15)
BUN: 21 mg/dL (ref 8–23)
CO2: 24 mmol/L (ref 22–32)
Calcium: 8.9 mg/dL (ref 8.9–10.3)
Chloride: 96 mmol/L — ABNORMAL LOW (ref 98–111)
Creatinine, Ser: 4.15 mg/dL — ABNORMAL HIGH (ref 0.61–1.24)
GFR calc Af Amer: 17 mL/min — ABNORMAL LOW (ref >60)
GFR calc non Af Amer: 14 mL/min — ABNORMAL LOW (ref >60)
Glucose, Bld: 115 mg/dL — ABNORMAL HIGH (ref 70–99)
Phosphorus: 2.7 mg/dL (ref 2.5–4.6)
Potassium: 4.1 mmol/L (ref 3.5–5.1)
Sodium: 135 mmol/L (ref 135–145)

## 2019-02-07 LAB — GLUCOSE, CAPILLARY
Glucose-Capillary: 110 mg/dL — ABNORMAL HIGH (ref 70–99)
Glucose-Capillary: 150 mg/dL — ABNORMAL HIGH (ref 70–99)
Glucose-Capillary: 168 mg/dL — ABNORMAL HIGH (ref 70–99)
Glucose-Capillary: 176 mg/dL — ABNORMAL HIGH (ref 70–99)
Glucose-Capillary: 194 mg/dL — ABNORMAL HIGH (ref 70–99)
Glucose-Capillary: 88 mg/dL (ref 70–99)

## 2019-02-07 MED ORDER — LISINOPRIL 10 MG PO TABS
10.0000 mg | ORAL_TABLET | Freq: Every day | ORAL | Status: DC
  Administered 2019-02-07 – 2019-02-17 (×11): 10 mg via ORAL
  Filled 2019-02-07 (×11): qty 1

## 2019-02-07 NOTE — Progress Notes (Signed)
Physical Therapy Treatment Patient Details Name: Cole Nelson MRN: [id number] DOB: 10/30/1955 Today's Date: 02/07/2019    History of Present Illness 63 year old male with history of ESRD on TTS- iHD via LUE AVF, DM, tobacco abuse, diabetic gastroparesis, GERD, HTN, diastolic HF, multiple sclerosis, CVA w/residual left sided weakness, chronic left pleural effusion, hx cdiff, diabetic neuropathy, anemia, HLD, and depression, was admitted on 7/11 to [facility] with acute shortness of breath and hypertensive urgency.  Found on admit to have complete opacification of left hemithorax. Acute on chronic pleural effusions s/p left VATS, concern for pleural mass vs infectious consolidation    PT Comments    Continuing work on functional mobility and activity tolerance;  Very tired this morning, but still willing to get up and work on walking; Needing more assist to steady, noting more narrow base of support and near scissoring with today's walk; Participating well, despite feeling very tired; Continue to recommend comprehensive inpatient rehab (CIR) for post-acute therapy needs.   Follow Up Recommendations  CIR     Equipment Recommendations  Rolling walker with 5" wheels;3in1 (PT)    Recommendations for Other Services       Precautions / Restrictions Precautions Precautions: Fall Restrictions Weight Bearing Restrictions: No    Mobility  Bed Mobility Overal bed mobility: Needs Assistance Bed Mobility: Supine to Sit     Supine to sit: Mod assist     General bed mobility comments: Light mod handheld assist to progress trunk to upright position  Transfers Overall transfer level: Needs assistance Equipment used: Rolling walker (2 wheeled) Transfers: Sit to/from Stand Sit to Stand: Mod assist         General transfer comment: Light mod assist to power up into standing;pt with increased effort and time;cues for safe hand placement  Ambulation/Gait Ambulation/Gait  assistance: Min assist;Mod assist Gait Distance (Feet): 20 Feet Assistive device: Rolling walker (2 wheeled) Gait Pattern/deviations: Step-through pattern;Decreased step length - left;Scissoring;Narrow base of support;Trunk flexed     General Gait Details: Moving more slowly this morning; very narrow base of support, at times near scissoring, and at one point, L foot stepping on R sock; stopped for standing rest break, and noted L knee weakness in standing; brought chair up behind pt   Stairs             Wheelchair Mobility    Modified Rankin (Stroke Patients Only)       Balance Overall balance assessment: Needs assistance Sitting-balance support: Feet supported Sitting balance-Leahy Scale: Fair       Standing balance-Leahy Scale: Poor Standing balance comment: reliant on Bil. UE support                             Cognition Arousal/Alertness: Awake/alert Behavior During Therapy: WFL for tasks assessed/performed Overall Cognitive Status: Impaired/Different from baseline Area of Impairment: Attention;Safety/judgement;Awareness;Problem solving                   Current Attention Level: Sustained   Following Commands: Follows one step commands consistently     Problem Solving: Slow processing;Difficulty sequencing;Requires verbal cues;Requires tactile cues        Exercises      General Comments General comments (skin integrity, edema, etc.): HR 85; BP 167/83 sitting in reclienr after walk      Pertinent Vitals/Pain Pain Assessment: No/denies pain    Home Living  Prior Function            PT Goals (current goals can now be found in the care plan section) Acute Rehab PT Goals Patient Stated Goal: Did not state, but very willing to try walking, despite being quite tired PT Goal Formulation: With patient Time For Goal Achievement: 02/14/19 Potential to Achieve Goals: Good Progress towards PT goals:  Progressing toward goals(Though slow progress; very tired this morning)    Frequency    Min 3X/week      PT Plan Current plan remains appropriate    Co-evaluation              AM-PAC PT "6 Clicks" Mobility   Outcome Measure  Help needed turning from your back to your side while in a flat bed without using bedrails?: A Little Help needed moving from lying on your back to sitting on the side of a flat bed without using bedrails?: A Little Help needed moving to and from a bed to a chair (including a wheelchair)?: A Little Help needed standing up from a chair using your arms (e.g., wheelchair or bedside chair)?: A Lot Help needed to walk in hospital room?: A Lot Help needed climbing 3-5 steps with a railing? : A Lot 6 Click Score: 15    End of Session Equipment Utilized During Treatment: Gait belt Activity Tolerance: Patient tolerated treatment well Patient left: in chair;with call bell/phone within reach;with chair alarm set Nurse Communication: Mobility status PT Visit Diagnosis: Other abnormalities of gait and mobility (R26.89);Muscle weakness (generalized) (M62.81)     Time: 4580-9983 PT Time Calculation (min) (ACUTE ONLY): 32 min  Charges:  $Gait Training: 8-22 mins $Therapeutic Activity: 8-22 mins                     [name], PT  Acute Rehabilitation Services Pager [phone] Office [phone]    [name] 02/07/2019, 11:14 AM

## 2019-02-07 NOTE — Progress Notes (Signed)
Patient ID: Cole Nelson, male   DOB: 07-Oct-1955, 63 y.o.   MRN: [id number] [facility] Progress Note   Assessment/ Plan:   1.  Acute on chronic pleural effusion status post VATS: Status post completion of antibiotics with Levaquin and vancomycin, remains afebrile with stable respiratory status.  Plans noted for outpatient follow-up with [facility] thoracic surgery. 2. ESRD: Continue hemodialysis on a TTS schedule with next dialysis due on Tuesday; he is euvolemic on exam and does not have any uremic type symptoms or signs.  Plans noted for possible admission to CIR. 3. Anemia: Iron stores are replete and he does not have any overt blood loss.  Await CBC to monitor trend. 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 5. 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 6. Hypertension: Blood pressure remains elevated, will titrate antihypertensive therapy.  Subjective:   Denies any acute complaints overnight.  Getting ready for physical therapy.   Objective:   BP (!) 169/73 (BP Location: Right Arm)   Pulse 84   Temp 98.7 F (37.1 C) (Oral)   Resp 20   Ht 5\' 11"  (1.803 m)   Wt 62.5 kg   SpO2 96%   BMI 19.22 kg/m   Physical Exam: Gen: Comfortably resting in bed, getting ready for physical therapy. CVS: Pulse regular rhythm, normal rate, S1 and S2 normal Resp: Clear to auscultation, no rales/rhonchi Abd: Soft, flat, nontender Ext: Left upper arm AV fistula with positive thrill.  No pedal edema  Labs: BMET Recent Labs  Lab 02/01/19 0719 02/02/19 0555 02/03/19 0535 02/03/19 1117 02/04/19 0814 02/07/19 0634  NA 130* 130* 133*  --  132* 135  K 4.1 4.2 3.7  --  3.7 4.1  CL 94* 95* 96*  --  95* 96*  CO2 23 22 25   --  22 24  GLUCOSE 219* 115* 95  --  144* 115*  BUN 37* 49* 29*  --  39* 21  CREATININE 5.82* 7.03* 4.87*  --  6.54* 4.15*  CALCIUM 8.6* 8.9 8.8*  --  8.9 8.9   PHOS  --   --   --  3.4  --  2.7   CBC Recent Labs  Lab 02/03/19 0535 02/04/19 0814 02/04/19 1452 02/05/19 0533 02/07/19 0634  WBC 5.9 5.4  --  8.2 13.2*  NEUTROABS 3.7 3.6  --  5.9 11.2*  HGB 7.0* 7.0* 9.0* 8.4* 9.3*  HCT 22.8* 22.2* 27.8* 26.2* 29.6*  MCV 81.1 78.7*  --  80.9 82.2  PLT 250 250  --  249 276   Medications:    . amLODipine  5 mg Oral BID  . aspirin  81 mg Oral Daily  . budesonide  0.25 mg Nebulization BID  . carvedilol  25 mg Oral BID  . Chlorhexidine Gluconate Cloth  6 each Topical Daily  . Chlorhexidine Gluconate Cloth  6 each Topical Q0600  . cloNIDine  0.3 mg Oral BID  . darbepoetin (ARANESP) injection - DIALYSIS  60 mcg Intravenous Q Tue-HD  . feeding supplement (NEPRO CARB STEADY)  237 mL Oral BID BM  . feeding supplement (PRO-STAT SUGAR FREE 64)  30 mL Oral BID  . ferrous sulfate  325 mg Oral Daily  . FLUoxetine  20 mg Oral Daily  . heparin  5,000 Units Subcutaneous Q8H  . hydrALAZINE  100 mg Oral TID  . insulin aspart  0-9 Units Subcutaneous Q4H  . mouth rinse  15 mL  Mouth Rinse BID  . metoCLOPramide (REGLAN) injection  10 mg Intravenous TID AC & HS  . montelukast  10 mg Oral Daily  . multivitamin  1 tablet Oral QHS  . nystatin  5 mL Oral QID  . pantoprazole  40 mg Oral Q0600   Elmarie Shiley, MD 02/07/2019, 9:42 AM

## 2019-02-07 NOTE — Plan of Care (Signed)
  Problem: Activity: Goal: Risk for activity intolerance will decrease Outcome: Progressing   Problem: Nutrition: Goal: Adequate nutrition will be maintained Outcome: Progressing   

## 2019-02-07 NOTE — Progress Notes (Signed)
PROGRESS NOTE    Cole Nelson  WTU:[id number] DOB: 1956-05-20 DOA: 01/28/2019 PCP: Patient, No Pcp Per   Brief Narrative: Patient is a 64 year old male with history of ESRD on dialysis, diabetes mellitus, tobacco abuse, diabetes gastroparesis, GERD, hypertension, diastolic heart failure, multiple sclerosis, CVA with residual left-sided weakness, chronic left pleural effusion, chronic anemia who was admitted on 7/11 at [facility] with acute shortness of breath and hypertensive urgency.  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  Patient has been transferred to hospital service.  Currently he is hemodynamically stable.  He is being dialysed   PT/OT evaluated him and recommended CIR on discharge. Patient is medically stable for discharge to CIR as soon as the bed is available.  Assessment & Plan:   Active Problems:   Acute encephalopathy   Pressure injury of skin   Pleural effusion   End-stage renal disease on hemodialysis (HCC)   Protein-calorie malnutrition, severe   Nausea and vomiting   Acute gastric erosion   Abnormal CT scan, small bowel   Acute on chronic pleural effusion: Status post left VATS.  Concern for pleural mass versus infectious consolidation.  Currently he is hemodynamically stable.  Not septic.  Currently on room air.  Blood cultures no growth till date.  Pleural fluid cytology did not show any neoplasia.Repeat CT chest without contrast for follow-up showed chronic large complex left hydropneumothorax (currently in no respiratory distress, off oxygen saturating well). Completed antibiotics course. The case was discussed with cardiothoracic at [facility] and felt that this is acute consolidation rather than a mass and recommended further outpatient follow up after completion of antibiotic therapy.  Plan is to follow-up with cardiothoracic  surgeons at [facility] thoracic surgery) for further management once discharged.  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  Tolerating diet.  CT showed no clear evidence of SMA syndrome.  General surgery was following.  GI was also following and underwent enteroscopy on 01/31/2019 which showed food residue in the stomach consistent with gastroparesis, gastric erosions, status post biopsy.  No evidence of inflammation, stricture.  GI recommended gastroparesis diet, Reglan, daily PPI.  ESRD on dialysis: Dialyzed on TTS.  Nephrology following.  Hypertension/diastolic heart failure: Currently euvolemic.  Echo showed hyperdynamic left ventricular ejection fraction.  Blood pressure stable.  Continue his home regimen  Anemia of chronic disease:   He was transfused with 1 unit of PRBC on 02/03/19.Hb stable  Diabetes type 2: Continue sliding scale insulin.  Home regimen on hold.  Multiple sclerosis: Stable  GERD: Continue PPI  Debility/Deconditioning: Patient evaluated PT and OT and recommended CIR on discharge.  CIR consulted and following.   Nutrition Problem: Severe Malnutrition Etiology: chronic illness(ESRD on HD, CHF)      DVT prophylaxis: heparin [place] Code Status: Full Family Communication: None Disposition Plan: CIR as soon as bed is available   Consultants: PCCM, nephrology, GI  Procedures: Hemodialysis, enteroscopy  Antimicrobials:  Anti-infectives (From admission, onward)   Start     Dose/Rate Route Frequency Ordered Stop   02/02/19 1200  vancomycin (VANCOCIN) IVPB 750 mg/150 ml premix     750 mg 150 mL/hr over 60 Minutes Intravenous Every T-Th-Sa (Hemodialysis) 01/31/19 0846 02/05/19 0649   02/01/19 0800  vancomycin (VANCOCIN) IVPB 750 mg/150 ml premix     750 mg 150 mL/hr over 60 Minutes Intravenous  Once 02/01/19 0753 02/01/19 1030  01/28/19 0330  levofloxacin (LEVAQUIN) IVPB 500 mg     500 mg 100 mL/hr over 60 Minutes Intravenous  Every 48 hours 01/28/19 0323 02/03/19 0451   01/28/19 0323  vancomycin variable dose per unstable renal function (pharmacist dosing)  Status:  Discontinued      Does not apply See admin instructions 01/28/19 0323 02/01/19 1400      Subjective:  Patient seen and examined the bedside this morning.  Mildly hypertensive this morning but otherwise hemodynamically stable.  Denies any complaints.  No new issues.  Talked to the wife on phone today and give the update.   Objective: Vitals:   02/06/19 2210 02/06/19 2254 02/07/19 0545 02/07/19 0844  BP: (!) 160/79 (!) 179/72 (!) 169/73   Pulse: 88 89 84   Resp: 18 18 20    Temp: 98.5 F (36.9 C) 99 F (37.2 C) 98.7 F (37.1 C)   TempSrc: Oral Oral Oral   SpO2: 97% 100% 95% 96%  Weight: 62.5 kg     Height:        Intake/Output Summary (Last 24 hours) at 02/07/2019 1054 Last data filed at 02/07/2019 0200 Gross per 24 hour  Intake 360 ml  Output 1500 ml  Net -1140 ml   Filed Weights   02/06/19 0414 02/06/19 1830 02/06/19 2210  Weight: 65 kg 64 kg 62.5 kg    Examination:  General exam: Not in distress, deconditioned/debilitated HEENT:PERRL,Oral mucosa moist, Ear/Nose normal on gross exam Respiratory system: Decreased air entry on the left side , no wheezes or crackles Cardiovascular system: S1 & S2 heard, RRR. No JVD, murmurs, rubs, gallops or clicks. Gastrointestinal system: Abdomen is nondistended, soft and nontender. No organomegaly or masses felt. Normal bowel sounds heard. Central nervous system: Alert and oriented. No focal neurological deficits. Extremities: No edema, no clubbing ,no cyanosis, distal peripheral pulses palpable.  AV graft on the left arm Skin: No rashes, lesions or ulcers,no icterus ,no pallor  Data Reviewed: I have personally reviewed following labs and imaging studies  CBC: Recent Labs  Lab 02/02/19 0555 02/03/19 0535 02/04/19 0814 02/04/19 1452 02/05/19 0533 02/07/19 0634  WBC 5.3 5.9 5.4  --  8.2  13.2*  NEUTROABS 3.5 3.7 3.6  --  5.9 11.2*  HGB 7.5* 7.0* 7.0* 9.0* 8.4* 9.3*  HCT 23.7* 22.8* 22.2* 27.8* 26.2* 29.6*  MCV 78.7* 81.1 78.7*  --  80.9 82.2  PLT 261 250 250  --  249 630   Basic Metabolic Panel: Recent Labs  Lab 02/01/19 0719 02/02/19 0555 02/03/19 0535 02/03/19 1117 02/04/19 0814 02/07/19 0634  NA 130* 130* 133*  --  132* 135  K 4.1 4.2 3.7  --  3.7 4.1  CL 94* 95* 96*  --  95* 96*  CO2 23 22 25   --  22 24  GLUCOSE 219* 115* 95  --  144* 115*  BUN 37* 49* 29*  --  39* 21  CREATININE 5.82* 7.03* 4.87*  --  6.54* 4.15*  CALCIUM 8.6* 8.9 8.8*  --  8.9 8.9  PHOS  --   --   --  3.4  --  2.7   GFR: Estimated Creatinine Clearance: 16.3 mL/min (A) (by C-G formula based on SCr of 4.15 mg/dL (H)). Liver Function Tests: Recent Labs  Lab 02/07/19 0634  ALBUMIN 2.5*   No results for input(s): LIPASE, AMYLASE in the last 168 hours. No results for input(s): AMMONIA in the last 168 hours. Coagulation Profile: No results for input(s): INR, PROTIME  in the last 168 hours. Cardiac Enzymes: No results for input(s): CKTOTAL, CKMB, CKMBINDEX, TROPONINI in the last 168 hours. BNP (last 3 results) No results for input(s): PROBNP in the last 8760 hours. HbA1C: No results for input(s): HGBA1C in the last 72 hours. CBG: Recent Labs  Lab 02/06/19 1614 02/06/19 2252 02/07/19 0008 02/07/19 0500 02/07/19 0738  GLUCAP 163* 100* 176* 168* 88   Lipid Profile: No results for input(s): CHOL, HDL, LDLCALC, TRIG, CHOLHDL, LDLDIRECT in the last 72 hours. Thyroid Function Tests: No results for input(s): TSH, T4TOTAL, FREET4, T3FREE, THYROIDAB in the last 72 hours. Anemia Panel: No results for input(s): VITAMINB12, FOLATE, FERRITIN, TIBC, IRON, RETICCTPCT in the last 72 hours. Sepsis Labs: No results for input(s): PROCALCITON, LATICACIDVEN in the last 168 hours.  No results found for this or any previous visit (from the past 240 hour(s)).       Radiology Studies: No  results found.      Scheduled Meds: . amLODipine  5 mg Oral BID  . aspirin  81 mg Oral Daily  . budesonide  0.25 mg Nebulization BID  . carvedilol  25 mg Oral BID  . Chlorhexidine Gluconate Cloth  6 each Topical Daily  . Chlorhexidine Gluconate Cloth  6 each Topical Q0600  . cloNIDine  0.3 mg Oral BID  . darbepoetin (ARANESP) injection - DIALYSIS  60 mcg Intravenous Q Tue-HD  . feeding supplement (NEPRO CARB STEADY)  237 mL Oral BID BM  . feeding supplement (PRO-STAT SUGAR FREE 64)  30 mL Oral BID  . ferrous sulfate  325 mg Oral Daily  . FLUoxetine  20 mg Oral Daily  . heparin  5,000 Units Subcutaneous Q8H  . hydrALAZINE  100 mg Oral TID  . insulin aspart  0-9 Units Subcutaneous Q4H  . lisinopril  10 mg Oral Daily  . mouth rinse  15 mL Mouth Rinse BID  . metoCLOPramide (REGLAN) injection  10 mg Intravenous TID AC & HS  . montelukast  10 mg Oral Daily  . multivitamin  1 tablet Oral QHS  . nystatin  5 mL Oral QID  . pantoprazole  40 mg Oral Q0600   Continuous Infusions: . sodium chloride 250 mL (02/01/19 0928)     LOS: 10 days    Time spent: 35 mins.More than 50% of that time was spent in counseling and/or coordination of care.      Shelly Coss, MD [facility] Pager [phone]  If 7PM-7AM, please contact night-coverage [email or web address] Password [facility] 02/07/2019, 10:54 AM

## 2019-02-07 NOTE — Plan of Care (Signed)
  Problem: Clinical Measurements: Goal: Respiratory complications will improve Outcome: Progressing   

## 2019-02-08 LAB — GLUCOSE, CAPILLARY
Glucose-Capillary: 105 mg/dL — ABNORMAL HIGH (ref 70–99)
Glucose-Capillary: 112 mg/dL — ABNORMAL HIGH (ref 70–99)
Glucose-Capillary: 131 mg/dL — ABNORMAL HIGH (ref 70–99)
Glucose-Capillary: 138 mg/dL — ABNORMAL HIGH (ref 70–99)
Glucose-Capillary: 152 mg/dL — ABNORMAL HIGH (ref 70–99)
Glucose-Capillary: 155 mg/dL — ABNORMAL HIGH (ref 70–99)
Glucose-Capillary: 97 mg/dL (ref 70–99)

## 2019-02-08 MED ORDER — CHLORHEXIDINE GLUCONATE CLOTH 2 % EX PADS
6.0000 | MEDICATED_PAD | Freq: Every day | CUTANEOUS | Status: DC
  Administered 2019-02-08: 6 via TOPICAL

## 2019-02-08 MED ORDER — DOXERCALCIFEROL 4 MCG/2ML IV SOLN
1.0000 ug | INTRAVENOUS | Status: DC
  Administered 2019-02-09 – 2019-02-16 (×4): 1 ug via INTRAVENOUS
  Filled 2019-02-08 (×4): qty 2

## 2019-02-08 NOTE — Progress Notes (Signed)
Patient ID: Cole Nelson, male   DOB: June 01, 1956, 63 y.o.   MRN: [id number] [facility] Progress Note   Assessment/ Plan:   1.  Acute on chronic pleural effusion status post VATS: Status post completion of antibiotics with Levaquin and vancomycin, remains afebrile with stable respiratory status.  Plans noted for outpatient follow-up with [facility] thoracic surgery. 2. ESRD: Continue hemodialysis on Cole TTS schedule via AVF ( normally done as OP in Portal ) with next dialysis due on Tuesday; he is euvolemic on exam and does not have any uremic type symptoms or signs.  Plans noted for possible admission to CIR. 3. Anemia: Iron stores are replete and he does not have any overt blood loss.  Await CBC to monitor trend. 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 No binder- cont home hectorol 5. 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 6. Hypertension: Blood pressure remains elevated, will titrate antihypertensive therapy. Now on 5 drug reg with good reading- UF with HD  Dialysis Prescription: [place], [place] TTS, Optiflux 180, EDW 77kg, 3hr 5min, BFR 450/DFR 800, 2K/2.5Ca, Epogen 10,000 units TIW, Hectorol 58mcg TIW, ONS, LUA AVF 15G, Standard Sodium, Heparin 2000units load and 700units/hr  Subjective:   Denies any acute complaints overnight.  Soft spoken    Objective:   BP (!) 130/50 (BP Location: Right Arm)   Pulse 77   Temp 98.4 F (36.9 C) (Oral)   Resp 18   Ht 5\' 11"  (1.803 m)   Wt 62.5 kg   SpO2 94%   BMI 19.22 kg/m   Physical Exam: Gen: Comfortably resting in bed, getting ready for physical therapy. CVS: Pulse regular rhythm, normal rate, S1 and S2 normal Resp: Clear to auscultation, no rales/rhonchi Abd: Soft, flat, nontender Ext: Left upper arm AV fistula with positive thrill.  No pedal edema  Labs: BMET Recent Labs  Lab  02/02/19 0555 02/03/19 0535 02/03/19 1117 02/04/19 0814 02/07/19 0634  NA 130* 133*  --  132* 135  K 4.2 3.7  --  3.7 4.1  CL 95* 96*  --  95* 96*  CO2 22 25  --  22 24  GLUCOSE 115* 95  --  144* 115*  BUN 49* 29*  --  39* 21  CREATININE 7.03* 4.87*  --  6.54* 4.15*  CALCIUM 8.9 8.8*  --  8.9 8.9  PHOS  --   --  3.4  --  2.7   CBC Recent Labs  Lab 02/03/19 0535 02/04/19 0814 02/04/19 1452 02/05/19 0533 02/07/19 0634  WBC 5.9 5.4  --  8.2 13.2*  NEUTROABS 3.7 3.6  --  5.9 11.2*  HGB 7.0* 7.0* 9.0* 8.4* 9.3*  HCT 22.8* 22.2* 27.8* 26.2* 29.6*  MCV 81.1 78.7*  --  80.9 82.2  PLT 250 250  --  249 276   Medications:    . amLODipine  5 mg Oral BID  . aspirin  81 mg Oral Daily  . budesonide  0.25 mg Nebulization BID  . carvedilol  25 mg Oral BID  . Chlorhexidine Gluconate Cloth  6 each Topical Daily  . Chlorhexidine Gluconate Cloth  6 each Topical Q0600  . cloNIDine  0.3 mg Oral BID  . darbepoetin (ARANESP) injection - DIALYSIS  60 mcg Intravenous Q Tue-HD  . feeding supplement (NEPRO CARB STEADY)  237 mL Oral BID BM  . feeding supplement (PRO-STAT SUGAR FREE 64)  30 mL  Oral BID  . ferrous sulfate  325 mg Oral Daily  . FLUoxetine  20 mg Oral Daily  . heparin  5,000 Units Subcutaneous Q8H  . hydrALAZINE  100 mg Oral TID  . insulin aspart  0-9 Units Subcutaneous Q4H  . lisinopril  10 mg Oral Daily  . mouth rinse  15 mL Mouth Rinse BID  . metoCLOPramide (REGLAN) injection  10 mg Intravenous TID AC & HS  . montelukast  10 mg Oral Daily  . multivitamin  1 tablet Oral QHS  . nystatin  5 mL Oral QID  . pantoprazole  40 mg Oral Q0600   Cole Nelson Cole Nelson  02/08/2019, 10:18 AM

## 2019-02-08 NOTE — Progress Notes (Signed)
Inpatient Rehab Admissions Coordinator:   Notified by pt's insurance that they have denied authorization for CIR. Dr. Tawanna Solo to do peer to peer to attempt to overturn denial.  I will continue to follow.   Shann Medal, PT, DPT Admissions Coordinator [phone] 02/08/19  11:00 AM

## 2019-02-08 NOTE — Progress Notes (Signed)
PROGRESS NOTE    Cole Nelson  UDJ:[id number] DOB: 1955/09/04 DOA: 01/28/2019 PCP: Patient, No Pcp Per   Brief Narrative: Patient is a 63 year old male with history of ESRD on dialysis, diabetes mellitus, tobacco abuse, diabetes gastroparesis, GERD, hypertension, diastolic heart failure, multiple sclerosis, CVA with residual left-sided weakness, chronic left pleural effusion, chronic anemia who was admitted on 7/11 at [facility] with acute shortness of breath and hypertensive urgency.  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  Patient has been transferred to hospital service.  Currently he is hemodynamically stable.  He is being dialysed   PT/OT evaluated him and recommended CIR on discharge. Patient is medically stable for discharge to CIR as soon as the bed is available.  Assessment & Plan:   Active Problems:   Acute encephalopathy   Pressure injury of skin   Pleural effusion   End-stage renal disease on hemodialysis (HCC)   Protein-calorie malnutrition, severe   Nausea and vomiting   Acute gastric erosion   Abnormal CT scan, small bowel   Acute on chronic pleural effusion: Status post left VATS.  Concern for pleural mass versus infectious consolidation.  Currently he is hemodynamically stable.  Not septic.  Currently on room air.  Blood cultures no growth till date.  Pleural fluid cytology did not show any neoplasia.Repeat CT chest without contrast for follow-up showed chronic large complex left hydropneumothorax (currently in no respiratory distress, off oxygen saturating well). Completed antibiotics course. The case was discussed with cardiothoracic at [facility] and felt that this is acute consolidation rather than a mass and recommended further outpatient follow up after completion of antibiotic therapy.  Plan is to follow-up with cardiothoracic  surgeons at [facility] thoracic surgery) for further management once discharged.  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  Tolerating diet.  CT showed no clear evidence of SMA syndrome.  General surgery was following.  GI was also following and underwent enteroscopy on 01/31/2019 which showed food residue in the stomach consistent with gastroparesis, gastric erosions, status post biopsy.  No evidence of inflammation, stricture.  GI recommended gastroparesis diet, Reglan, daily PPI.  ESRD on dialysis: Dialyzed on TTS.  Nephrology following.  Hypertension/diastolic heart failure: Currently euvolemic.  Echo showed hyperdynamic left ventricular ejection fraction.  Blood pressure stable.  Continue his home regimen  Anemia of chronic disease:   He was transfused with 1 unit of PRBC on 02/03/19.Hb stable  Diabetes type 2: Continue sliding scale insulin.  Home regimen on hold.  Multiple sclerosis: Stable  GERD: Continue PPI  Debility/Deconditioning: Patient evaluated PT and OT and recommended CIR on discharge.  CIR consulted and following.  PT/OT evaluations performed. CIR recommended. CIR appropriate as the patient has received 3 days of hospital care (or the 3 day period has been waived by the patient's insurance company) and is felt to need rehab services to restore this patient to their prior level of function to achieve safe transition back to home care.. Rehab is being requested as the most appropriate d/c option for this patient and is NOT felt to be for custodial care as evidenced by improvement on his overall status by physical therapy   Nutrition Problem: Severe Malnutrition Etiology: chronic illness(ESRD on HD, CHF)      DVT prophylaxis: heparin [place] Code Status: Full Family Communication: Called wife on phone on 02/07/19 Disposition Plan: CIR as soon as  bed is available   Consultants: PCCM, nephrology, GI  Procedures: Hemodialysis, enteroscopy   Antimicrobials:  Anti-infectives (From admission, onward)   Start     Dose/Rate Route Frequency Ordered Stop   02/02/19 1200  vancomycin (VANCOCIN) IVPB 750 mg/150 ml premix     750 mg 150 mL/hr over 60 Minutes Intravenous Every T-Th-Sa (Hemodialysis) 01/31/19 0846 02/05/19 0649   02/01/19 0800  vancomycin (VANCOCIN) IVPB 750 mg/150 ml premix     750 mg 150 mL/hr over 60 Minutes Intravenous  Once 02/01/19 0753 02/01/19 1030   01/28/19 0330  levofloxacin (LEVAQUIN) IVPB 500 mg     500 mg 100 mL/hr over 60 Minutes Intravenous Every 48 hours 01/28/19 0323 02/03/19 0451   01/28/19 0323  vancomycin variable dose per unstable renal function (pharmacist dosing)  Status:  Discontinued      Does not apply See admin instructions 01/28/19 0323 02/01/19 1400      Subjective:  Patient seen and examined the bedside this morning.  Hemodynamically stable.  Lying on the bed.  Generalized weakness.  Plan for dialysis tomorrow.   Objective: Vitals:   02/07/19 1931 02/07/19 1949 02/08/19 0824 02/08/19 0900  BP:  (!) 158/60 (!) 152/71 (!) 130/50  Pulse:  97 85 77  Resp:  (!) 22 20 18   Temp:  (!) 100.4 F (38 C) 98.7 F (37.1 C) 98.4 F (36.9 C)  TempSrc:  Oral Oral Oral  SpO2: 94% 92% 95% 94%  Weight:      Height:        Intake/Output Summary (Last 24 hours) at 02/08/2019 1113 Last data filed at 02/08/2019 0200 Gross per 24 hour  Intake 480 ml  Output 0 ml  Net 480 ml   Filed Weights   02/06/19 0414 02/06/19 1830 02/06/19 2210  Weight: 65 kg 64 kg 62.5 kg    Examination:  General exam: Not in distress, deconditioned/debilitated HEENT:PERRL,Oral mucosa moist, Ear/Nose normal on gross exam Respiratory system: Decreased air entry on the left side, no wheezes or crackles Cardiovascular system: S1 & S2 heard, RRR. No JVD, murmurs, rubs, gallops or clicks. Gastrointestinal system: Abdomen is nondistended, soft and nontender. No organomegaly or masses felt. Normal bowel sounds heard. Central  nervous system: Alert and oriented. No focal neurological deficits. Extremities: No edema, no clubbing ,no cyanosis, distal peripheral pulses palpable.AV graft on left arm Skin: No rashes, lesions or ulcers,no icterus ,no pallor    Data Reviewed: I have personally reviewed following labs and imaging studies  CBC: Recent Labs  Lab 02/02/19 0555 02/03/19 0535 02/04/19 0814 02/04/19 1452 02/05/19 0533 02/07/19 0634  WBC 5.3 5.9 5.4  --  8.2 13.2*  NEUTROABS 3.5 3.7 3.6  --  5.9 11.2*  HGB 7.5* 7.0* 7.0* 9.0* 8.4* 9.3*  HCT 23.7* 22.8* 22.2* 27.8* 26.2* 29.6*  MCV 78.7* 81.1 78.7*  --  80.9 82.2  PLT 261 250 250  --  249 829   Basic Metabolic Panel: Recent Labs  Lab 02/02/19 0555 02/03/19 0535 02/03/19 1117 02/04/19 0814 02/07/19 0634  NA 130* 133*  --  132* 135  K 4.2 3.7  --  3.7 4.1  CL 95* 96*  --  95* 96*  CO2 22 25  --  22 24  GLUCOSE 115* 95  --  144* 115*  BUN 49* 29*  --  39* 21  CREATININE 7.03* 4.87*  --  6.54* 4.15*  CALCIUM 8.9 8.8*  --  8.9 8.9  PHOS  --   --  3.4  --  2.7   GFR: Estimated Creatinine Clearance: 16.3 mL/min (A) (by C-G formula based on SCr of 4.15 mg/dL (H)). Liver Function Tests: Recent Labs  Lab 02/07/19 0634  ALBUMIN 2.5*   No results for input(s): LIPASE, AMYLASE in the last 168 hours. No results for input(s): AMMONIA in the last 168 hours. Coagulation Profile: No results for input(s): INR, PROTIME in the last 168 hours. Cardiac Enzymes: No results for input(s): CKTOTAL, CKMB, CKMBINDEX, TROPONINI in the last 168 hours. BNP (last 3 results) No results for input(s): PROBNP in the last 8760 hours. HbA1C: No results for input(s): HGBA1C in the last 72 hours. CBG: Recent Labs  Lab 02/07/19 1616 02/07/19 1947 02/08/19 0008 02/08/19 0358 02/08/19 0730  GLUCAP 150* 110* 152* 97 138*   Lipid Profile: No results for input(s): CHOL, HDL, LDLCALC, TRIG, CHOLHDL, LDLDIRECT in the last 72 hours. Thyroid Function Tests: No  results for input(s): TSH, T4TOTAL, FREET4, T3FREE, THYROIDAB in the last 72 hours. Anemia Panel: No results for input(s): VITAMINB12, FOLATE, FERRITIN, TIBC, IRON, RETICCTPCT in the last 72 hours. Sepsis Labs: No results for input(s): PROCALCITON, LATICACIDVEN in the last 168 hours.  No results found for this or any previous visit (from the past 240 hour(s)).       Radiology Studies: No results found.      Scheduled Meds: . amLODipine  5 mg Oral BID  . aspirin  81 mg Oral Daily  . budesonide  0.25 mg Nebulization BID  . carvedilol  25 mg Oral BID  . Chlorhexidine Gluconate Cloth  6 each Topical Q0600  . cloNIDine  0.3 mg Oral BID  . darbepoetin (ARANESP) injection - DIALYSIS  60 mcg Intravenous Q Tue-HD  . [START ON 02/09/2019] doxercalciferol  1 mcg Intravenous Q T,Th,Sa-HD  . feeding supplement (NEPRO CARB STEADY)  237 mL Oral BID BM  . feeding supplement (PRO-STAT SUGAR FREE 64)  30 mL Oral BID  . ferrous sulfate  325 mg Oral Daily  . FLUoxetine  20 mg Oral Daily  . heparin  5,000 Units Subcutaneous Q8H  . hydrALAZINE  100 mg Oral TID  . insulin aspart  0-9 Units Subcutaneous Q4H  . lisinopril  10 mg Oral Daily  . mouth rinse  15 mL Mouth Rinse BID  . metoCLOPramide (REGLAN) injection  10 mg Intravenous TID AC & HS  . montelukast  10 mg Oral Daily  . multivitamin  1 tablet Oral QHS  . nystatin  5 mL Oral QID  . pantoprazole  40 mg Oral Q0600   Continuous Infusions: . sodium chloride 250 mL (02/01/19 0928)     LOS: 11 days    Time spent: 35 mins.More than 50% of that time was spent in counseling and/or coordination of care.      Shelly Coss, MD [facility] Pager [phone]  If 7PM-7AM, please contact night-coverage [email or web address] Password [email or web address] 02/08/2019, 11:13 AM

## 2019-02-08 NOTE — Progress Notes (Signed)
Occupational Therapy Treatment Patient Details Name: Cole Nelson MRN: [id number] DOB: July 02, 1956 Today's Date: 02/08/2019    History of present illness 63 year old male with history of ESRD on TTS- iHD via LUE AVF, DM, tobacco abuse, diabetic gastroparesis, GERD, HTN, diastolic HF, multiple sclerosis, CVA w/residual left sided weakness, chronic left pleural effusion, hx cdiff, diabetic neuropathy, anemia, HLD, and depression, was admitted on 7/11 to [facility] with acute shortness of breath and hypertensive urgency.  Found on admit to have complete opacification of left hemithorax. Acute on chronic pleural effusions s/p left VATS, concern for pleural mass vs infectious consolidation   OT comments  Pt making good progress with functional goals. Pt pleasant and cooperative. Pt agreeable to OOB activity for grooming and ADLs. OT will continue to follow acutely  Follow Up Recommendations  CIR    Equipment Recommendations  3 in 1 bedside commode    Recommendations for Other Services      Precautions / Restrictions Precautions Precautions: Fall Restrictions Weight Bearing Restrictions: No       Mobility Bed Mobility Overal bed mobility: Needs Assistance Bed Mobility: Supine to Sit     Supine to sit: Supervision;HOB elevated     General bed mobility comments: used rails with HOB elevated  Transfers Overall transfer level: Needs assistance Equipment used: Rolling walker (2 wheeled) Transfers: Sit to/from Stand Sit to Stand: Mod assist;Min assist         General transfer comment: increased effort and time;cues for safe hand placement    Balance Overall balance assessment: Needs assistance Sitting-balance support: Feet supported Sitting balance-Leahy Scale: Fair     Standing balance support: Bilateral upper extremity supported;During functional activity Standing balance-Leahy Scale: Poor                             ADL either performed or  assessed with clinical judgement   ADL Overall ADL's : Needs assistance/impaired     Grooming: Moderate assistance;Minimal assistance;Wash/dry face;Wash/dry hands;Oral care;Standing;Sitting       Lower Body Bathing: Moderate assistance;Minimal assistance Lower Body Bathing Details (indicate cue type and reason): simulated     Lower Body Dressing: Moderate assistance;Minimal assistance   Toilet Transfer: Minimal assistance;Ambulation;RW;Cueing for safety;Cueing for sequencing   Toileting- Clothing Manipulation and Hygiene: Minimal assistance;Sit to/from stand               Vision Patient Visual Report: No change from baseline     Perception     Praxis      Cognition Arousal/Alertness: Awake/alert   Overall Cognitive Status: No family/caregiver present to determine baseline cognitive functioning Area of Impairment: Attention;Safety/judgement;Awareness;Problem solving                       Following Commands: Follows one step commands consistently Safety/Judgement: Decreased awareness of safety;Decreased awareness of deficits   Problem Solving: Slow processing;Difficulty sequencing;Requires verbal cues;Requires tactile cues General Comments: required fequent verbal cues for safety during sit - stand/standing ADLs        Exercises     Shoulder Instructions       General Comments      Pertinent Vitals/ Pain       Pain Assessment: No/denies pain  Home Living  Prior Functioning/Environment              Frequency  Min 2X/week        Progress Toward Goals  OT Goals(current goals can now be found in the care plan section)  Progress towards OT goals: Progressing toward goals  Acute Rehab OT Goals Patient Stated Goal: go home OT Goal Formulation: With patient ADL Goals Pt Will Perform Toileting - Clothing Manipulation and hygiene: with min guard assist;sit to/from stand  Plan  Discharge plan remains appropriate    Co-evaluation                 AM-PAC OT "6 Clicks" Daily Activity     Outcome Measure   Help from another person eating meals?: A Little Help from another person taking care of personal grooming?: A Little Help from another person toileting, which includes using toliet, bedpan, or urinal?: A Little Help from another person bathing (including washing, rinsing, drying)?: A Lot Help from another person to put on and taking off regular upper body clothing?: A Little Help from another person to put on and taking off regular lower body clothing?: A Little 6 Click Score: 17    End of Session Equipment Utilized During Treatment: Gait belt;Rolling walker  OT Visit Diagnosis: Unsteadiness on feet (R26.81);Other abnormalities of gait and mobility (R26.89);Muscle weakness (generalized) (M62.81)   Activity Tolerance Patient tolerated treatment well   Patient Left in chair;with chair alarm set   Nurse Communication          Time: 2575-0518 OT Time Calculation (min): 24 min  Charges: OT General Charges $OT Visit: 1 Visit OT Treatments $Self Care/Home Management : 8-22 mins $Therapeutic Activity: 8-22 mins     Britt Bottom 02/08/2019, 1:29 PM

## 2019-02-08 NOTE — Plan of Care (Signed)
  Problem: Health Behavior/Discharge Planning: Goal: Ability to manage health-related needs will improve Outcome: Progressing   Problem: Clinical Measurements: Goal: Ability to maintain clinical measurements within normal limits will improve Outcome: Progressing   Problem: Activity: Goal: Risk for activity intolerance will decrease Outcome: Progressing   Problem: Nutrition: Goal: Adequate nutrition will be maintained Outcome: Progressing   

## 2019-02-09 LAB — GLUCOSE, CAPILLARY
Glucose-Capillary: 142 mg/dL — ABNORMAL HIGH (ref 70–99)
Glucose-Capillary: 109 mg/dL — ABNORMAL HIGH (ref 70–99)
Glucose-Capillary: 165 mg/dL — ABNORMAL HIGH (ref 70–99)
Glucose-Capillary: 87 mg/dL (ref 70–99)
Glucose-Capillary: 171 mg/dL — ABNORMAL HIGH (ref 70–99)

## 2019-02-09 LAB — CBC
HCT: 25.3 % — ABNORMAL LOW (ref 39.0–52.0)
Hemoglobin: 8 g/dL — ABNORMAL LOW (ref 13.0–17.0)
MCH: 25.6 pg — ABNORMAL LOW (ref 26.0–34.0)
MCHC: 31.6 g/dL (ref 30.0–36.0)
MCV: 81.1 fL (ref 80.0–100.0)
Platelets: 268 10*3/uL (ref 150–400)
RBC: 3.12 MIL/uL — ABNORMAL LOW (ref 4.22–5.81)
RDW: 15.9 % — ABNORMAL HIGH (ref 11.5–15.5)
WBC: 14.7 10*3/uL — ABNORMAL HIGH (ref 4.0–10.5)
nRBC: 0 % (ref 0.0–0.2)

## 2019-02-09 LAB — RENAL FUNCTION PANEL
Albumin: 2.2 g/dL — ABNORMAL LOW (ref 3.5–5.0)
Anion gap: 13 (ref 5–15)
BUN: 63 mg/dL — ABNORMAL HIGH (ref 8–23)
CO2: 24 mmol/L (ref 22–32)
Calcium: 8.6 mg/dL — ABNORMAL LOW (ref 8.9–10.3)
Chloride: 91 mmol/L — ABNORMAL LOW (ref 98–111)
Creatinine, Ser: 7.74 mg/dL — ABNORMAL HIGH (ref 0.61–1.24)
GFR calc Af Amer: 8 mL/min — ABNORMAL LOW (ref >60)
GFR calc non Af Amer: 7 mL/min — ABNORMAL LOW (ref >60)
Glucose, Bld: 122 mg/dL — ABNORMAL HIGH (ref 70–99)
Phosphorus: 5.3 mg/dL — ABNORMAL HIGH (ref 2.5–4.6)
Potassium: 4.5 mmol/L (ref 3.5–5.1)
Sodium: 128 mmol/L — ABNORMAL LOW (ref 135–145)

## 2019-02-09 MED ORDER — DOXERCALCIFEROL 4 MCG/2ML IV SOLN
INTRAVENOUS | Status: AC
  Administered 2019-02-09: 1 ug via INTRAVENOUS
  Filled 2019-02-09: qty 2

## 2019-02-09 MED ORDER — DARBEPOETIN ALFA 60 MCG/0.3ML IJ SOSY
PREFILLED_SYRINGE | INTRAMUSCULAR | Status: AC
  Administered 2019-02-09: 60 ug via INTRAVENOUS
  Filled 2019-02-09: qty 0.3

## 2019-02-09 MED ORDER — HEPARIN SODIUM (PORCINE) 1000 UNIT/ML DIALYSIS: 20.0000 [IU]/kg | INTRAMUSCULAR | Status: DC | PRN

## 2019-02-09 NOTE — Progress Notes (Signed)
PROGRESS NOTE    Cole Nelson  ZGY:[id number] DOB: [birth year] DOA: 01/28/2019 PCP: Patient, No Pcp Per   Brief Narrative: Patient is a 63 year old male with history of ESRD on dialysis, diabetes mellitus, tobacco abuse, diabetes gastroparesis, GERD, hypertension, diastolic heart failure, multiple sclerosis, CVA with residual left-sided weakness, chronic left pleural effusion, chronic anemia who was admitted on 7/11 at [facility] with acute shortness of breath and hypertensive urgency.  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  Patient has been transferred to hospital service.  Currently he is hemodynamically stable.  He is being dialysed   PT/OT evaluated him and recommended CIR on discharge. Patient is medically stable for discharge to CIR as soon as the bed is available.  Assessment & Plan:   Active Problems:   Acute encephalopathy   Pressure injury of skin   Pleural effusion   End-stage renal disease on hemodialysis (HCC)   Protein-calorie malnutrition, severe   Nausea and vomiting   Acute gastric erosion   Abnormal CT scan, small bowel   Acute on chronic pleural effusion: Status post left VATS.  Concern for pleural mass versus infectious consolidation.  Currently he is hemodynamically stable.  Not septic.  Currently on room air.  Blood cultures no growth till date.  Pleural fluid cytology did not show any neoplasia.Repeat CT chest without contrast for follow-up showed chronic large complex left hydropneumothorax (currently in no respiratory distress, off oxygen saturating well). Completed antibiotics course. The case was discussed with cardiothoracic at [facility] and felt that this is acute consolidation rather than a mass and recommended further outpatient follow up after completion of antibiotic therapy.  Plan is to follow-up with cardiothoracic  surgeons at [facility] thoracic surgery) for further management as an outpatient once discharged.  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  Tolerating diet.  CT showed no clear evidence of SMA syndrome.  General surgery was following.  GI was also following and underwent enteroscopy on 01/31/2019 which showed food residue in the stomach consistent with gastroparesis, gastric erosions, status post biopsy.  No evidence of inflammation, stricture.  GI recommended gastroparesis diet, Reglan, daily PPI.  ESRD on dialysis: Dialyzed on TTS.  Nephrology following.  Hypertension/diastolic heart failure: Currently euvolemic.  Echo showed hyperdynamic left ventricular ejection fraction.  Blood pressure stable.  Continue his home regimen  Anemia of chronic disease:   He was transfused with 1 unit of PRBC on 02/03/19.Hb stable  Diabetes type 2: Continue sliding scale insulin.  Home regimen on hold.  Multiple sclerosis: Stable  GERD: Continue PPI  Debility/Deconditioning: Patient evaluated PT and OT and recommended CIR on discharge.  CIR consulted but his insurance has denied and recommended SNF.  PT/OT evaluations performed. SNF recommended. SNF appropriate as the patient has received 3 days of hospital care (or the 3 day period has been waived by the patient's insurance company) and is felt to need rehab services to restore this patient to their prior level of function to achieve safe transition back to home care.. Rehab is being requested as the most appropriate d/c option for this patient and is NOT felt to be for custodial care as evidenced by improvement on his overall status by physical therapy   Nutrition Problem: Severe Malnutrition Etiology: chronic illness(ESRD on HD, CHF)      DVT prophylaxis: heparin Mitchell Code Status: Full Family Communication: Called wife on  phone on 02/07/19 Disposition Plan: SNF as soon as bed is available   Consultants: PCCM,  nephrology, GI  Procedures: Hemodialysis, enteroscopy  Antimicrobials:  Anti-infectives (From admission, onward)   Start     Dose/Rate Route Frequency Ordered Stop   02/02/19 1200  vancomycin (VANCOCIN) IVPB 750 mg/150 ml premix     750 mg 150 mL/hr over 60 Minutes Intravenous Every T-Th-Sa (Hemodialysis) 01/31/19 0846 02/05/19 0649   02/01/19 0800  vancomycin (VANCOCIN) IVPB 750 mg/150 ml premix     750 mg 150 mL/hr over 60 Minutes Intravenous  Once 02/01/19 0753 02/01/19 1030   01/28/19 0330  levofloxacin (LEVAQUIN) IVPB 500 mg     500 mg 100 mL/hr over 60 Minutes Intravenous Every 48 hours 01/28/19 0323 02/03/19 0451   01/28/19 0323  vancomycin variable dose per unstable renal function (pharmacist dosing)  Status:  Discontinued      Does not apply See admin instructions 01/28/19 0323 02/01/19 1400      Subjective:  Patient seen and examined at bedside this morning.  Currently hemodynamically stable.  Comfortable.  Denies any complaints.  I have made him aware that CIR has been denied.  He agrees with skilled nursing  facility placement.  Social worker will talk to the wife  Objective: Vitals:   02/09/19 0438 02/09/19 0451 02/09/19 0821 02/09/19 1014  BP: (!) 132/58   (!) 130/56  Pulse: 76   75  Resp: 18   18  Temp: 98.6 F (37 C)   98.4 F (36.9 C)  TempSrc: Oral   Oral  SpO2: 94%  95% 97%  Weight:  62.5 kg    Height:        Intake/Output Summary (Last 24 hours) at 02/09/2019 1110 Last data filed at 02/09/2019 0600 Gross per 24 hour  Intake 240 ml  Output 0 ml  Net 240 ml   Filed Weights   02/06/19 2210 02/08/19 2033 02/09/19 0451  Weight: 62.5 kg 62.5 kg 62.5 kg    Examination:  General exam: Comfortable, not in distress, deconditioned/debilitated HEENT:PERRL,Oral mucosa moist, Ear/Nose normal on gross exam Respiratory system: Mildly decreased air entry on the left side, no wheezes or crackles Cardiovascular system: S1 & S2 heard, RRR. No JVD, murmurs, rubs,  gallops or clicks. Gastrointestinal system: Abdomen is nondistended, soft and nontender. No organomegaly or masses felt. Normal bowel sounds heard. Central nervous system: Alert and oriented. No focal neurological deficits. Extremities: No edema, no clubbing ,no cyanosis, distal peripheral pulses palpable.  AV graft on the left arm Skin: No rashes, lesions or ulcers,no icterus ,no pallor   Data Reviewed: I have personally reviewed following labs and imaging studies  CBC: Recent Labs  Lab 02/03/19 0535 02/04/19 0814 02/04/19 1452 02/05/19 0533 02/07/19 0634  WBC 5.9 5.4  --  8.2 13.2*  NEUTROABS 3.7 3.6  --  5.9 11.2*  HGB 7.0* 7.0* 9.0* 8.4* 9.3*  HCT 22.8* 22.2* 27.8* 26.2* 29.6*  MCV 81.1 78.7*  --  80.9 82.2  PLT 250 250  --  249 850   Basic Metabolic Panel: Recent Labs  Lab 02/03/19 0535 02/03/19 1117 02/04/19 0814 02/07/19 0634  NA 133*  --  132* 135  K 3.7  --  3.7 4.1  CL 96*  --  95* 96*  CO2 25  --  22 24  GLUCOSE 95  --  144* 115*  BUN 29*  --  39* 21  CREATININE 4.87*  --  6.54* 4.15*  CALCIUM 8.8*  --  8.9 8.9  PHOS  --  3.4  --  2.7   GFR: Estimated Creatinine Clearance: 16.3 mL/min (A) (by C-G formula based on SCr of 4.15 mg/dL (H)). Liver Function Tests: Recent Labs  Lab 02/07/19 0634  ALBUMIN 2.5*   No results for input(s): LIPASE, AMYLASE in the last 168 hours. No results for input(s): AMMONIA in the last 168 hours. Coagulation Profile: No results for input(s): INR, PROTIME in the last 168 hours. Cardiac Enzymes: No results for input(s): CKTOTAL, CKMB, CKMBINDEX, TROPONINI in the last 168 hours. BNP (last 3 results) No results for input(s): PROBNP in the last 8760 hours. HbA1C: No results for input(s): HGBA1C in the last 72 hours. CBG: Recent Labs  Lab 02/08/19 1630 02/08/19 2032 02/08/19 2355 02/09/19 0436 02/09/19 0720  GLUCAP 131* 105* 155* 142* 109*   Lipid Profile: No results for input(s): CHOL, HDL, LDLCALC, TRIG, CHOLHDL,  LDLDIRECT in the last 72 hours. Thyroid Function Tests: No results for input(s): TSH, T4TOTAL, FREET4, T3FREE, THYROIDAB in the last 72 hours. Anemia Panel: No results for input(s): VITAMINB12, FOLATE, FERRITIN, TIBC, IRON, RETICCTPCT in the last 72 hours. Sepsis Labs: No results for input(s): PROCALCITON, LATICACIDVEN in the last 168 hours.  No results found for this or any previous visit (from the past 240 hour(s)).       Radiology Studies: No results found.      Scheduled Meds:  amLODipine  5 mg Oral BID   aspirin  81 mg Oral Daily   budesonide  0.25 mg Nebulization BID   carvedilol  25 mg Oral BID   Chlorhexidine Gluconate Cloth  6 each Topical Q0600   cloNIDine  0.3 mg Oral BID   darbepoetin (ARANESP) injection - DIALYSIS  60 mcg Intravenous Q Tue-HD   doxercalciferol  1 mcg Intravenous Q T,Th,Sa-HD   feeding supplement (NEPRO CARB STEADY)  237 mL Oral BID BM   feeding supplement (PRO-STAT SUGAR FREE 64)  30 mL Oral BID   ferrous sulfate  325 mg Oral Daily   FLUoxetine  20 mg Oral Daily   heparin  5,000 Units Subcutaneous Q8H   hydrALAZINE  100 mg Oral TID   insulin aspart  0-9 Units Subcutaneous Q4H   lisinopril  10 mg Oral Daily   mouth rinse  15 mL Mouth Rinse BID   metoCLOPramide (REGLAN) injection  10 mg Intravenous TID AC & HS   montelukast  10 mg Oral Daily   multivitamin  1 tablet Oral QHS   nystatin  5 mL Oral QID   pantoprazole  40 mg Oral Q0600   Continuous Infusions:  sodium chloride 250 mL (02/01/19 0928)     LOS: 12 days    Time spent: 35 mins.More than 50% of that time was spent in counseling and/or coordination of care.      Shelly Coss, MD [facility] Pager [phone]  If 7PM-7AM, please contact night-coverage [email or web address] Password [email or web address] 02/09/2019, 11:10 AM

## 2019-02-09 NOTE — Progress Notes (Signed)
Patient ID: Cole Nelson, male   DOB: 1956-02-24, 63 y.o.   MRN: [id number] Cole Nelson Progress Note   Assessment/ Plan:   1.  Acute on chronic pleural effusion status post VATS: Status post completion of antibiotics with Levaquin and vancomycin, remains afebrile with stable respiratory status.  Plans noted for outpatient follow-up with [facility] thoracic surgery. 2. ESRD: Continue hemodialysis on a TTS schedule via AVF ( normally done as OP in Cole Nelson ) with next dialysis due today; he is euvolemic on exam and does not have any uremic type symptoms or signs.  Plans noted for possible admission to CIR. 3. Anemia: Iron stores are replete and he does not have any overt blood loss.  Await CBC to monitor trend. 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 No binder- cont home hectorol 5. 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 6. Hypertension:  Now on 5 drug reg with good reading- UF with HD  Dialysis Prescription: Cole [facility], Optiflux 180, EDW 77kg, 3hr 75min, BFR 450/DFR 800, 2K/2.5Ca, Epogen 10,000 units TIW, Hectorol 87mcg TIW, ONS, LUA AVF 15G, Standard Sodium, Heparin 2000units load and 700units/hr  Subjective:   Denies any acute complaints overnight.  Soft spoken    Objective:   BP (!) 130/56 (BP Location: Right Arm)   Pulse 75   Temp 98.4 F (36.9 C) (Oral)   Resp 18   Ht 5\' 11"  (1.803 m)   Wt 62.5 kg   SpO2 97%   BMI 19.22 kg/m   Physical Exam: Gen: Comfortably resting in bed, getting ready for physical therapy. CVS: Pulse regular rhythm, normal rate, S1 and S2 normal Resp: Clear to auscultation, no rales/rhonchi Abd: Soft, flat, nontender Ext: Left upper arm AV fistula with positive thrill.  No pedal edema  Labs: BMET Recent Labs  Lab 02/03/19 0535 02/03/19 1117 02/04/19 0814 02/07/19 0634  NA 133*  --  132*  135  K 3.7  --  3.7 4.1  CL 96*  --  95* 96*  CO2 25  --  22 24  GLUCOSE 95  --  144* 115*  BUN 29*  --  39* 21  CREATININE 4.87*  --  6.54* 4.15*  CALCIUM 8.8*  --  8.9 8.9  PHOS  --  3.4  --  2.7   CBC Recent Labs  Lab 02/03/19 0535 02/04/19 0814 02/04/19 1452 02/05/19 0533 02/07/19 0634  WBC 5.9 5.4  --  8.2 13.2*  NEUTROABS 3.7 3.6  --  5.9 11.2*  HGB 7.0* 7.0* 9.0* 8.4* 9.3*  HCT 22.8* 22.2* 27.8* 26.2* 29.6*  MCV 81.1 78.7*  --  80.9 82.2  PLT 250 250  --  249 276   Medications:    . amLODipine  5 mg Oral BID  . aspirin  81 mg Oral Daily  . budesonide  0.25 mg Nebulization BID  . carvedilol  25 mg Oral BID  . Chlorhexidine Gluconate Cloth  6 each Topical Q0600  . cloNIDine  0.3 mg Oral BID  . darbepoetin (ARANESP) injection - DIALYSIS  60 mcg Intravenous Q Tue-HD  . doxercalciferol  1 mcg Intravenous Q T,Th,Sa-HD  . feeding supplement (NEPRO CARB STEADY)  237 mL Oral BID BM  . feeding supplement (PRO-STAT SUGAR FREE 64)  30 mL Oral BID  . ferrous sulfate  325 mg Oral Daily  . FLUoxetine  20 mg Oral Daily  . heparin  5,000 Units Subcutaneous Q8H  . hydrALAZINE  100 mg Oral TID  . insulin aspart  0-9 Units Subcutaneous Q4H  . lisinopril  10 mg Oral Daily  . mouth rinse  15 mL Mouth Rinse BID  . metoCLOPramide (REGLAN) injection  10 mg Intravenous TID AC & HS  . montelukast  10 mg Oral Daily  . multivitamin  1 tablet Oral QHS  . nystatin  5 mL Oral QID  . pantoprazole  40 mg Oral Q0600   Cole Nelson A Cole Nelson  02/09/2019, 11:54 AM

## 2019-02-09 NOTE — TOC Initial Note (Signed)
Transition of Care [facility]) - Initial/Assessment Note    Patient Details  Name: Cole Nelson MRN: [id number] Date of Birth: 04-Feb-1956  Transition of [facility]) CM/SW Contact:    Sable Feil, LCSW Phone Number: 02/09/2019, 6:01 PM  Clinical Narrative:  Talked with wife by phone [phone]) regarding discharge disposition and recommendation of ST rehab. Mrs. Gainor expressed agreement with SNF and requested placement in a facility in [place], [place]. Mrs. Mandler informed CSW of facility in town that she does not want and explained why. CSW informed that patient has been to [facility] and [facility] and she likes the [facility] facility.                Expected Discharge Plan: Skilled Nursing Facility Barriers to Discharge: SNF Pending bed offer(Seeking a bed in [place] or nearby facility in [place] for [place] rehab)   Patient Goals and CMS Choice Patient states their goals for this hospitalization and ongoing recovery are:: Wife desires a facility in [place] or nearby where her husband and get rehab and be transported to dialysis CMS Medicare.gov Compare Post Acute Care list provided to:: Other (Comment Required)(CSW advised wife that facilities in [place] and surrounding areas would be contacted. Wife also provided her facility preferences) Choice offered to / list presented to : Spouse(Talked with wife by phone regarding facilities contacted and that would be contacted)  Expected Discharge Plan and Services Expected Discharge Plan: [place] arrangements for the past 2 months: Single Family Home                                      Prior Living Arrangements/Services Living arrangements for the past 2 months: Single Family Home Lives with:: Spouse Patient language and need for interpreter reviewed:: No Do you feel safe going back to the place where you live?: No(Wife in agreement with ST rehab for patient)      Need  for Family Participation in Patient Care: Yes (Comment) Care giver support system in place?: Yes (comment)   Criminal Activity/Legal Involvement Pertinent to Current Situation/Hospitalization: No - Comment as needed  Activities of Daily Living Home Assistive Devices/Equipment: Environmental consultant (specify type), Wheelchair, Oxygen ADL Screening (condition at time of admission) Patient's cognitive ability adequate to safely complete daily activities?: No Is the patient deaf or have difficulty hearing?: No Does the patient have difficulty seeing, even when wearing glasses/contacts?: No Does the patient have difficulty concentrating, remembering, or making decisions?: Yes Patient able to express need for assistance with ADLs?: Yes Does the patient have difficulty dressing or bathing?: Yes Independently performs ADLs?: Yes (appropriate for developmental age) Does the patient have difficulty walking or climbing stairs?: Yes Weakness of Legs: Both Weakness of Arms/Hands: Both  Permission Sought/Granted                  Emotional Assessment Appearance:: Appears stated age Attitude/Demeanor/Rapport: Unable to Assess(Patient was asleep when visited room) Affect (typically observed): Unable to Assess(Patient asleep when visited room) Orientation: : Oriented to Self, Oriented to Situation Alcohol / Substance Use: Tobacco Use, Alcohol Use, Illicit Drugs(Per H&P, patient smokes and does not drink or use illicit drugs) Psych Involvement: No (comment)  Admission diagnosis:  pleural effusion nausea and vomiting Patient Active Problem List   Diagnosis Date Noted  . Nausea and vomiting   . Acute gastric erosion   . Abnormal  CT scan, small bowel   . Protein-calorie malnutrition, severe 01/29/2019  . Acute encephalopathy 01/28/2019  . Pressure injury of skin 01/28/2019  . Pleural effusion   . End-stage renal disease on hemodialysis [facility])    PCP:  Patient, No Pcp Per Pharmacy:   [id number] -  [place], [place] [address] Phone: [phone] Fax: [phone]     Social Determinants of Health (SDOH) Interventions  No SDOH interventions needed at this time.  Readmission Risk Interventions No flowsheet data found.

## 2019-02-09 NOTE — Progress Notes (Signed)
Inpatient Rehab Admissions Coordinator:   Notified by pt's insurance company that denial was upheld following peer to peer review.  Pt will need to pursue SNF for rehab.  Dr. Tawanna Solo, family, and CSW aware.   Shann Medal, PT, DPT Admissions Coordinator [phone] 02/09/19  11:56 AM

## 2019-02-09 NOTE — Procedures (Signed)
Patient was seen on dialysis and the procedure was supervised.  BFR 400  Via AVF BP is  124/61.   Patient appears to be tolerating treatment well  Louis Meckel 02/09/2019

## 2019-02-09 NOTE — Clinical Social Work Note (Signed)
Facility search initiated for a SNF: 1. [facility] [phone]. Talked with admissions director Clarene Critchley. Clinicals transmitted to facility - fax [phone]. Was informed later by Clarene Critchley that they can't meet patient's needs as they are not accepting dialysis patients at this time. [facility] [phone] ([place]). Talked with admissions director Wells Guiles. No male beds.  [place], [place], [place]). Unable to accept patient as his dialysis center is in [place] and they would not be able to provide transport. [place], [place]. They usually don't transport residents out of [place].  CSW will continue search for a skilled facility for patient.  Bobbe Quilter Givens, MSW, LCSW Licensed Clinical Social Worker [place] [phone]

## 2019-02-09 NOTE — Progress Notes (Signed)
PT Cancellation Note  Patient Details Name: Hjalmar Ballengee MRN: [id number] DOB: 1956-04-19   Cancelled Treatment:    Reason Eval/Treat Not Completed: Patient at procedure or test/unavailable  Currently in HD;   [name], [place]  Acute Rehabilitation Services Pager [phone] Office [place] 02/09/2019, 1:52 PM

## 2019-02-09 NOTE — Plan of Care (Signed)
  Problem: Education: Goal: Knowledge of General Education information will improve Description: Including pain rating scale, medication(s)/side effects and non-pharmacologic comfort measures Outcome: Progressing   Problem: Safety: Goal: Ability to remain free from injury will improve Outcome: Progressing   

## 2019-02-09 NOTE — Care Management Important Message (Signed)
Important Message  Patient Details  Name: Cole Nelson MRN: [id number] Date of Birth: Aug 21, 1955   Medicare Important Message Given:  Yes     Nehemiah Montee 02/09/2019, 1:14 PM

## 2019-02-10 LAB — GLUCOSE, CAPILLARY
Glucose-Capillary: 128 mg/dL — ABNORMAL HIGH (ref 70–99)
Glucose-Capillary: 138 mg/dL — ABNORMAL HIGH (ref 70–99)
Glucose-Capillary: 146 mg/dL — ABNORMAL HIGH (ref 70–99)
Glucose-Capillary: 162 mg/dL — ABNORMAL HIGH (ref 70–99)
Glucose-Capillary: 184 mg/dL — ABNORMAL HIGH (ref 70–99)
Glucose-Capillary: 84 mg/dL (ref 70–99)

## 2019-02-10 LAB — CBC WITH DIFFERENTIAL/PLATELET
Abs Immature Granulocytes: 0.02 10*3/uL (ref 0.00–0.07)
Basophils Absolute: 0.1 10*3/uL (ref 0.0–0.1)
Basophils Relative: 1 %
Eosinophils Absolute: 0.1 10*3/uL (ref 0.0–0.5)
Eosinophils Relative: 2 %
HCT: 28.5 % — ABNORMAL LOW (ref 39.0–52.0)
Hemoglobin: 8.9 g/dL — ABNORMAL LOW (ref 13.0–17.0)
Immature Granulocytes: 0 %
Lymphocytes Relative: 7 %
Lymphs Abs: 0.5 10*3/uL — ABNORMAL LOW (ref 0.7–4.0)
MCH: 25.5 pg — ABNORMAL LOW (ref 26.0–34.0)
MCHC: 31.2 g/dL (ref 30.0–36.0)
MCV: 81.7 fL (ref 80.0–100.0)
Monocytes Absolute: 0.8 10*3/uL (ref 0.1–1.0)
Monocytes Relative: 9 %
Neutro Abs: 6.6 10*3/uL (ref 1.7–7.7)
Neutrophils Relative %: 81 %
Platelets: 299 10*3/uL (ref 150–400)
RBC: 3.49 MIL/uL — ABNORMAL LOW (ref 4.22–5.81)
RDW: 15.7 % — ABNORMAL HIGH (ref 11.5–15.5)
WBC: 8.1 10*3/uL (ref 4.0–10.5)
nRBC: 0 % (ref 0.0–0.2)

## 2019-02-10 LAB — BASIC METABOLIC PANEL
Anion gap: 13 (ref 5–15)
BUN: 27 mg/dL — ABNORMAL HIGH (ref 8–23)
CO2: 25 mmol/L (ref 22–32)
Calcium: 8.8 mg/dL — ABNORMAL LOW (ref 8.9–10.3)
Chloride: 94 mmol/L — ABNORMAL LOW (ref 98–111)
Creatinine, Ser: 4.73 mg/dL — ABNORMAL HIGH (ref 0.61–1.24)
GFR calc Af Amer: 14 mL/min — ABNORMAL LOW (ref >60)
GFR calc non Af Amer: 12 mL/min — ABNORMAL LOW (ref >60)
Glucose, Bld: 166 mg/dL — ABNORMAL HIGH (ref 70–99)
Potassium: 3.9 mmol/L (ref 3.5–5.1)
Sodium: 132 mmol/L — ABNORMAL LOW (ref 135–145)

## 2019-02-10 MED ORDER — CHLORHEXIDINE GLUCONATE CLOTH 2 % EX PADS: 6.0000 | MEDICATED_PAD | Freq: Every day | CUTANEOUS | Status: DC

## 2019-02-10 NOTE — Progress Notes (Signed)
Nutrition Follow-up  DOCUMENTATION CODES:   Underweight, Severe malnutrition in context of chronic illness  INTERVENTION:    Continue Nepro Shake po BID, each supplement provides 425 kcal and 19 grams protein  Continue 30 ml Prostat BID, each supplement provides 100 kcals and 15 grams protein  Continue renal MVI daily  NUTRITION DIAGNOSIS:   Severe Malnutrition related to chronic illness(ESRD on HD, CHF) as evidenced by severe muscle depletion, severe fat depletion.  Ongoing  GOAL:   Patient will meet greater than or equal to 90% of their needs  Progressing  MONITOR:   PO intake, Supplement acceptance, Diet advancement, Labs, I & O's, Weight trends, Skin  REASON FOR ASSESSMENT:   Other (Comment)(underweight BMI)    ASSESSMENT:   63 year old male who presented on 7/23 from [facility] where he had been hospitalized since 7/11 for SOB, hypoxia, chronic pleural effusion s/p left VATS on 7/13 with questionable pulsating mass and ongoing N/V with concern for SMA syndrome, recent worsening SIRS with encephalopathy. PMH of ESRD on HD, DM, diabetic gastroparesis, GERD, HTN, CHF, MS, CVA with residual left-sided weakness, HLD, anemia.   7/24 - NG tube d/c, clear liquids 7/26- diet advanced renal/CM  Spoke with pt at bedside. Reports appetite is better today. Denies nausea/vomiting. Meal completions charted as 35-100% for pt's last five meals. Pt drinking Nepro BID. Will continue with current interventions.   EDW: 77 kg (prior to wt loss?)  Admission weight: 57.4 kg  Current weight: 61.5 kg  I/O: +3,315 ml since admit Last HD yesterday: 3000 ml net UF   Medications: aranesp, hectorol, ferrous sulfate, SS novolog, reglan, rena-vit Labs: Na 132 (L) Phosphorus 5.3 (wdl for HD)   Diet Order:   Diet Order            Diet renal/carb modified with fluid restriction Diet-HS Snack? Nothing; Fluid restriction: 1200 mL Fluid; Room service appropriate? Yes; Fluid  consistency: Thin  Diet effective now              EDUCATION NEEDS:   Not appropriate for education at this time  Skin:  Skin Assessment: Skin Integrity Issues: Skin Integrity Issues:: Stage II Stage II: right buttocks  Last BM:  7/29  Height:   Ht Readings from Last 1 Encounters:  01/30/19 5\' 11"  (1.803 m)    Weight:   Wt Readings from Last 1 Encounters:  02/10/19 61.5 kg    Ideal Body Weight:  78.2 kg  BMI:  Body mass index is 18.91 kg/m.  Estimated Nutritional Needs:   Kcal:  1800-2000  Protein:  80-95 grams  Fluid:  >/= 1.8 L   Mariana Single RD, LDN Clinical Nutrition Pager # [phone]

## 2019-02-10 NOTE — Clinical Social Work Note (Addendum)
Talked with Mrs. Bearce regarding the ongoing facility search and patient's current mode of transportation to dialysis. Per wife, patient uses Medicaid transportation [place] [phone]. Call made to [facility] and was advised that they provide transportation to [place], [place], [place], and [place] in [place]. When asked, CSW also advised that they will transport to [facility] in [place].  Call made to [facility] and talked with Merrilyn Puma in admissions [phone]) regarding patient. CSW advised that no beds available right now, but requested that patient's clinicals be sent to them - fax# [phone]. CSW will continue facility search in [place] and [place], [place] if needed.  Kaislyn Gulas Givens, MSW, LCSW Licensed Clinical Social Worker [place] [phone]

## 2019-02-10 NOTE — Progress Notes (Signed)
Occupational Therapy Treatment Patient Details Name: Cole Nelson MRN: [id number] DOB: [birth year] Today's Date: 02/10/2019    History of present illness 63 year old male with history of ESRD on TTS- iHD via LUE AVF, DM, tobacco abuse, diabetic gastroparesis, GERD, HTN, diastolic HF, multiple sclerosis, CVA w/residual left sided weakness, chronic left pleural effusion, hx cdiff, diabetic neuropathy, anemia, HLD, and depression, was admitted on 7/11 to [facility] with acute shortness of breath and hypertensive urgency.  Found on admit to have complete opacification of left hemithorax. Acute on chronic pleural effusions s/p left VATS, concern for pleural mass vs infectious consolidation   OT comments  Pt progressing towards acute OT goals. Pt reporting feeling fatigued at start of session. Focus of session was toilet transfer and pericare. Continue to feel pt would benefit intensive rehab at d/c. Noted that pt's insurance has denied CIR. If CIR not an option may need to consider ST SNF prior to home.   Follow Up Recommendations  CIR    Equipment Recommendations  3 in 1 bedside commode    Recommendations for Other Services      Precautions / Restrictions Precautions Precautions: Fall Restrictions Weight Bearing Restrictions: No       Mobility Bed Mobility Overal bed mobility: Needs Assistance Bed Mobility: Supine to Sit;Sit to Supine     Supine to sit: HOB elevated;Min assist Sit to supine: Mod assist   General bed mobility comments: extra time and effort to come to full EOB position. Therapist utilized bed pad to advance hips fully.  Transfers Overall transfer level: Needs assistance Equipment used: Rolling walker (2 wheeled) Transfers: Sit to/from Stand Sit to Stand: Mod assist;Min assist         General transfer comment: increased effort and time;cues for safe hand placement. assist to steady during powerup/descent.    Balance Overall balance assessment:  Needs assistance Sitting-balance support: Feet supported Sitting balance-Leahy Scale: Fair     Standing balance support: Bilateral upper extremity supported;During functional activity Standing balance-Leahy Scale: Poor Standing balance comment: reliant on Bil. UE support                            ADL either performed or assessed with clinical judgement   ADL Overall ADL's : Needs assistance/impaired                         Toilet Transfer: Minimal assistance;Ambulation;RW;Cueing for safety;Cueing for sequencing   Toileting- Clothing Manipulation and Hygiene: Minimal assistance;Sit to/from stand;Moderate assistance       Functional mobility during ADLs: Minimal assistance;Rolling walker General ADL Comments: Pt with decreased activity tolerance this session. Reported feeling very tired upon therapist arrival. Found to be incontinent of bowel (pt unaware). Pericare completed standing EOB with pt holding onto rw and therapist cleaning pt. Pt then walked to bathroom and had another bm. Pt completed pericare this time with assist for balance while pt completed cleaning tasks. Question throughness but pt declined therapist assist stating that he felt he was clean enough.     Vision       Perception     Praxis      Cognition Arousal/Alertness: Awake/alert Behavior During Therapy: Flat affect Overall Cognitive Status: No family/caregiver present to determine baseline cognitive functioning Area of Impairment: Attention;Safety/judgement;Awareness;Problem solving                   Current Attention Level: Sustained  Following Commands: Follows one step commands consistently;Follows one step commands with increased time Safety/Judgement: Decreased awareness of safety;Decreased awareness of deficits Awareness: Intellectual Problem Solving: Slow processing;Decreased initiation;Difficulty sequencing General Comments: Pt incontinent of large amount of stool  with no awareness        Exercises     Shoulder Instructions       General Comments      Pertinent Vitals/ Pain       Pain Assessment: Faces Faces Pain Scale: Hurts a little bit Pain Location: stomach Pain Descriptors / Indicators: Aching Pain Intervention(s): Monitored during session  Home Living                                          Prior Functioning/Environment              Frequency  Min 2X/week        Progress Toward Goals  OT Goals(current goals can now be found in the care plan section)  Progress towards OT goals: Progressing toward goals  Acute Rehab OT Goals Patient Stated Goal: go home OT Goal Formulation: With patient Time For Goal Achievement: 02/15/19 Potential to Achieve Goals: Good ADL Goals Pt Will Perform Grooming: with min assist;standing Pt Will Perform Lower Body Bathing: with min assist;sit to/from stand Pt Will Perform Lower Body Dressing: with min assist;sit to/from stand Pt Will Transfer to Toilet: with min assist;ambulating;regular height toilet Pt Will Perform Toileting - Clothing Manipulation and hygiene: with min guard assist;sit to/from stand  Plan Discharge plan remains appropriate    Co-evaluation                 AM-PAC OT "6 Clicks" Daily Activity     Outcome Measure   Help from another person eating meals?: A Little Help from another person taking care of personal grooming?: A Little Help from another person toileting, which includes using toliet, bedpan, or urinal?: A Little Help from another person bathing (including washing, rinsing, drying)?: A Lot Help from another person to put on and taking off regular upper body clothing?: A Little Help from another person to put on and taking off regular lower body clothing?: A Little 6 Click Score: 17    End of Session Equipment Utilized During Treatment: Gait belt;Rolling walker  OT Visit Diagnosis: Unsteadiness on feet (R26.81);Other  abnormalities of gait and mobility (R26.89);Muscle weakness (generalized) (M62.81)   Activity Tolerance Patient limited by fatigue;Patient tolerated treatment well   Patient Left in bed;with call bell/phone within reach;with bed alarm set   Nurse Communication          Time: [phone] OT Time Calculation (min): 22 min  Charges: OT General Charges $OT Visit: 1 Visit OT Treatments $Self Care/Home Management : 8-22 mins  Tyrone Schimke, OT Acute Rehabilitation Services Pager: [phone] Office: [phone]    Hortencia Pilar 02/10/2019, 12:37 PM

## 2019-02-10 NOTE — Progress Notes (Signed)
Patient ID: Cole Nelson, male   DOB: 03-Dec-1955, 63 y.o.   MRN: [id number] [facility] Progress Note   Assessment/ Plan:   1.  Acute on chronic pleural effusion status post VATS: Status post completion of antibiotics with Levaquin and vancomycin, remains afebrile with stable respiratory status.  Plans noted for outpatient follow-up with [facility] thoracic surgery. 2. ESRD: Continue hemodialysis on a TTS schedule via AVF ( normally done as OP in [place] ) with next dialysis due tomorrow; he is euvolemic on exam and does not have any uremic type symptoms or signs.  Plans noted for possible admission to CIR, now SNF- search has been difficult. 3. Anemia: Iron stores are replete and he does not have any overt blood loss.  Await CBC to monitor trend. 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 No binder- cont home hectorol 5. 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 6. Hypertension:  Now on 5 drug reg with good reading- UF with HD  Dialysis Pres[facility] [place], Optiflux 180, EDW 77kg, 3hr 6min, BFR 450/DFR 800, 2K/2.5Ca, Epogen 10,000 units TIW, Hectorol 60mcg TIW, ONS, LUA AVF 15G, Standard Sodium, Heparin 2000units load and 700units/hr  Subjective:   HD yest- removed 3000 tolerated well   Objective:   BP (!) 146/69 (BP Location: Right Arm)   Pulse 77   Temp 98 F (36.7 C) (Oral)   Resp 18   Ht 5\' 11"  (1.803 m)   Wt 61.5 kg   SpO2 100%   BMI 18.91 kg/m   Physical Exam: Gen: In bedside chair-  No c/o's  CVS: Pulse regular rhythm, normal rate, S1 and S2 normal Resp: Clear to auscultation, no rales/rhonchi Abd: Soft, flat, nontender Ext: Left upper arm AV fistula with positive thrill.  No pedal edema  Labs: BMET Recent Labs  Lab 02/03/19 1117 02/04/19 0814 02/07/19 0634 02/09/19 1442 02/10/19 0509  NA  --  132* 135  128* 132*  K  --  3.7 4.1 4.5 3.9  CL  --  95* 96* 91* 94*  CO2  --  22 24 24 25   GLUCOSE  --  144* 115* 122* 166*  BUN  --  39* 21 63* 27*  CREATININE  --  6.54* 4.15* 7.74* 4.73*  CALCIUM  --  8.9 8.9 8.6* 8.8*  PHOS 3.4  --  2.7 5.3*  --    CBC Recent Labs  Lab 02/04/19 0814  02/05/19 0533 02/07/19 0634 02/09/19 1442 02/10/19 0509  WBC 5.4  --  8.2 13.2* 14.7* 8.1  NEUTROABS 3.6  --  5.9 11.2*  --  6.6  HGB 7.0*   < > 8.4* 9.3* 8.0* 8.9*  HCT 22.2*   < > 26.2* 29.6* 25.3* 28.5*  MCV 78.7*  --  80.9 82.2 81.1 81.7  PLT 250  --  249 276 268 299   < > = values in this interval not displayed.   Medications:    . amLODipine  5 mg Oral BID  . aspirin  81 mg Oral Daily  . budesonide  0.25 mg Nebulization BID  . carvedilol  25 mg Oral BID  . Chlorhexidine Gluconate Cloth  6 each Topical Q0600  . cloNIDine  0.3 mg Oral BID  . darbepoetin (ARANESP) injection - DIALYSIS  60 mcg Intravenous Q Tue-HD  . doxercalciferol  1 mcg Intravenous Q T,Th,Sa-HD  . feeding supplement (NEPRO CARB STEADY)  237 mL Oral BID BM  . feeding supplement (PRO-STAT SUGAR FREE 64)  30 mL Oral BID  . ferrous sulfate  325 mg Oral Daily  . FLUoxetine  20 mg Oral Daily  . heparin  5,000 Units Subcutaneous Q8H  . hydrALAZINE  100 mg Oral TID  . insulin aspart  0-9 Units Subcutaneous Q4H  . lisinopril  10 mg Oral Daily  . mouth rinse  15 mL Mouth Rinse BID  . metoCLOPramide (REGLAN) injection  10 mg Intravenous TID AC & HS  . montelukast  10 mg Oral Daily  . multivitamin  1 tablet Oral QHS  . nystatin  5 mL Oral QID  . pantoprazole  40 mg Oral Q0600   Cole Nelson A Kenyanna Grzesiak  02/10/2019, 11:11 AM

## 2019-02-10 NOTE — Plan of Care (Signed)
  Problem: Education: Goal: Knowledge of General Education information will improve Description Including pain rating scale, medication(s)/side effects and non-pharmacologic comfort measures Outcome: Progressing   

## 2019-02-10 NOTE — Progress Notes (Signed)
Physical Therapy Treatment Patient Details Name: Ason Heslin MRN: [id number] DOB: 1956/05/18 Today's Date: 02/10/2019    History of Present Illness 63 year old male with history of ESRD on TTS- iHD via LUE AVF, DM, tobacco abuse, diabetic gastroparesis, GERD, HTN, diastolic HF, multiple sclerosis, CVA w/residual left sided weakness, chronic left pleural effusion, hx cdiff, diabetic neuropathy, anemia, HLD, and depression, was admitted on 7/11 to [facility] with acute shortness of breath and hypertensive urgency.  Found on admit to have complete opacification of left hemithorax. Acute on chronic pleural effusions s/p left VATS, concern for pleural mass vs infectious consolidation    PT Comments    Continuing work on functional mobility and activity tolerance;  Much improved activity tolerance, with incr gait distance, than last session; Will consider a standardized balance exam next session, perhaps the [facility];   Noted insurance denied CIR; I continue to recommend post-acute rehab.   Follow Up Recommendations  CIR(freq at least 5 days/week or more at CIR)     Equipment Recommendations  Rolling walker with 5" wheels;3in1 (PT)    Recommendations for Other Services       Precautions / Restrictions Precautions Precautions: Fall Restrictions Weight Bearing Restrictions: No    Mobility  Bed Mobility Overal bed mobility: Needs Assistance Bed Mobility: Supine to Sit     Supine to sit: Min guard     General bed mobility comments: minguard for safety; cues to come fully to EOB and square off hips  Transfers Overall transfer level: Needs assistance Equipment used: Rolling walker (2 wheeled) Transfers: Sit to/from Stand Sit to Stand: Min assist         General transfer comment: increased effort and time;cues for safe hand placement. assist to steady during powerup/descent.  Ambulation/Gait Ambulation/Gait assistance: Min Web designer (Feet): (Hallway  ambulation) Assistive device: Rolling walker (2 wheeled) Gait Pattern/deviations: Step-through pattern;Decreased step length - left;Scissoring;Narrow base of support;Trunk flexed     General Gait Details: Cues for upright posture, and to incr step width (near scissoring at times); cues also to make sure L step length stays roughly equal to R step length; Much better walking than last PT session   Stairs             Wheelchair Mobility    Modified Rankin (Stroke Patients Only)       Balance     Sitting balance-Leahy Scale: Fair       Standing balance-Leahy Scale: Poor                              Cognition Arousal/Alertness: Awake/alert Behavior During Therapy: WFL for tasks assessed/performed Overall Cognitive Status: No family/caregiver present to determine baseline cognitive functioning Area of Impairment: Attention;Safety/judgement;Awareness;Problem solving                   Current Attention Level: Sustained   Following Commands: Follows one step commands consistently;Follows one step commands with increased time Safety/Judgement: Decreased awareness of safety;Decreased awareness of deficits Awareness: Intellectual Problem Solving: Slow processing;Decreased initiation;Difficulty sequencing        Exercises      General Comments        Pertinent Vitals/Pain Pain Assessment: No/denies pain    Home Living                      Prior Function            PT Goals (  current goals can now be found in the care plan section) Acute Rehab PT Goals Patient Stated Goal: go home PT Goal Formulation: With patient Time For Goal Achievement: 02/14/19 Potential to Achieve Goals: Good Progress towards PT goals: Progressing toward goals    Frequency    Min 3X/week(Acute Care Only)      PT Plan Current plan remains appropriate    Co-evaluation              AM-PAC PT "6 Clicks" Mobility   Outcome Measure  Help  needed turning from your back to your side while in a flat bed without using bedrails?: A Little Help needed moving from lying on your back to sitting on the side of a flat bed without using bedrails?: A Little Help needed moving to and from a bed to a chair (including a wheelchair)?: A Little Help needed standing up from a chair using your arms (e.g., wheelchair or bedside chair)?: A Lot Help needed to walk in hospital room?: A Lot Help needed climbing 3-5 steps with a railing? : A Lot 6 Click Score: 15    End of Session Equipment Utilized During Treatment: Gait belt Activity Tolerance: Patient tolerated treatment well Patient left: in chair;with call bell/phone within reach;with chair alarm set Nurse Communication: Mobility status PT Visit Diagnosis: Other abnormalities of gait and mobility (R26.89);Muscle weakness (generalized) (M62.81)     Time: 1040-1100 PT Time Calculation (min) (ACUTE ONLY): 20 min  Charges:  $Gait Training: 8-22 mins                     [name], [place]  Acute Rehabilitation Services Pager [phone] Office [place] 02/10/2019, 1:58 PM

## 2019-02-10 NOTE — Progress Notes (Signed)
PROGRESS NOTE    Cole Nelson  FHL:[id number] DOB: 11-22-1955 DOA: 01/28/2019 PCP: Patient, No Pcp Per   Brief Narrative: Patient is a 63 year old male with history of ESRD on dialysis, diabetes mellitus, tobacco abuse, diabetes gastroparesis, GERD, hypertension, diastolic heart failure, multiple sclerosis, CVA with residual left-sided weakness, chronic left pleural effusion, chronic anemia who was admitted on 7/11 at [facility] with acute shortness of breath and hypertensive urgency.  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  Patient has been transferred to hospital service.  Currently he is hemodynamically stable.  He is being dialysed   PT/OT evaluated him and recommended CIR on discharge.His insurance denied CIR.Plan is for SNF now. SW following. Patient is medically stable for discharge to SNF as soon as the bed is available.  Assessment & Plan:   Active Problems:   Acute encephalopathy   Pressure injury of skin   Pleural effusion   End-stage renal disease on hemodialysis (HCC)   Protein-calorie malnutrition, severe   Nausea and vomiting   Acute gastric erosion   Abnormal CT scan, small bowel   Acute on chronic pleural effusion: Status post left VATS.  Concern for pleural mass versus infectious consolidation.  Currently he is hemodynamically stable.  Not septic.  Currently on room air.  Blood cultures no growth till date.  Pleural fluid cytology did not show any neoplasia.Repeat CT chest without contrast for follow-up showed chronic large complex left hydropneumothorax (currently in no respiratory distress, off oxygen saturating well). Completed antibiotics course. The case was discussed with cardiothoracic at [facility] and felt that this is acute consolidation rather than a mass and recommended further outpatient follow up after completion of  antibiotic therapy.  Plan is to follow-up with cardiothoracic surgeons at [facility] thoracic surgery) for further management as an outpatient once discharged.  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  Tolerating diet.  CT showed no clear evidence of SMA syndrome.  General surgery was following.  GI was also following and underwent enteroscopy on 01/31/2019 which showed food residue in the stomach consistent with gastroparesis, gastric erosions, status post biopsy.  No evidence of inflammation, stricture.  GI recommended gastroparesis diet, Reglan, daily PPI.  ESRD on dialysis: Dialyzed on TTS.  Nephrology following.  Hypertension/diastolic heart failure: Currently euvolemic.  Echo showed hyperdynamic left ventricular ejection fraction.  Blood pressure stable.  Continue his home regimen  Anemia of chronic disease:   He was transfused with 1 unit of PRBC on 02/03/19.Hb stable  Diabetes type 2: Continue sliding scale insulin.  Home regimen on hold.  Multiple sclerosis: Stable  GERD: Continue PPI  Debility/Deconditioning: Patient evaluated PT and OT and recommended CIR on discharge.  CIR consulted but his insurance has denied and recommended SNF.  PT/OT evaluations performed. SNF recommended. SNF appropriate as the patient has received 3 days of hospital care (or the 3 day period has been waived by the patient's insurance company) and is felt to need rehab services to restore this patient to their prior level of function to achieve safe transition back to home care.. Rehab is being requested as the most appropriate d/c option for this patient and is NOT felt to be for custodial care as evidenced by improvement on his overall status by physical therapy   Nutrition Problem: Severe Malnutrition Etiology: chronic illness(ESRD on HD, CHF)      DVT prophylaxis: heparin  [place] Code Status: Full Family Communication: Called wife on phone on 02/07/19. Again called on  02/10/19,call not received Disposition Plan: SNF as soon as bed is available   Consultants: PCCM, nephrology, GI  Procedures: Hemodialysis, enteroscopy  Antimicrobials:  Anti-infectives (From admission, onward)   Start     Dose/Rate Route Frequency Ordered Stop   02/02/19 1200  vancomycin (VANCOCIN) IVPB 750 mg/150 ml premix     750 mg 150 mL/hr over 60 Minutes Intravenous Every T-Th-Sa (Hemodialysis) 01/31/19 0846 02/05/19 0649   02/01/19 0800  vancomycin (VANCOCIN) IVPB 750 mg/150 ml premix     750 mg 150 mL/hr over 60 Minutes Intravenous  Once 02/01/19 0753 02/01/19 1030   01/28/19 0330  levofloxacin (LEVAQUIN) IVPB 500 mg     500 mg 100 mL/hr over 60 Minutes Intravenous Every 48 hours 01/28/19 0323 02/03/19 0451   01/28/19 0323  vancomycin variable dose per unstable renal function (pharmacist dosing)  Status:  Discontinued      Does not apply See admin instructions 01/28/19 0323 02/01/19 1400      Subjective:  Patient seen and examined the bedside this morning.  Hemodynamically stable. No active issues.  Lying on the bed.  Denies any complaints.  Objective: Vitals:   02/10/19 0411 02/10/19 0437 02/10/19 0813 02/10/19 0915  BP: (!) 141/62   (!) 146/69  Pulse: 74  74 77  Resp: 18  17 18   Temp: 98.3 F (36.8 C)   98 F (36.7 C)  TempSrc: Oral   Oral  SpO2: 96%  100% 100%  Weight:  61.5 kg    Height:        Intake/Output Summary (Last 24 hours) at 02/10/2019 1410 Last data filed at 02/10/2019 1208 Gross per 24 hour  Intake 980 ml  Output 3000 ml  Net -2020 ml   Filed Weights   02/09/19 1340 02/09/19 1714 02/10/19 0437  Weight: 65.7 kg 61.5 kg 61.5 kg    Examination:  General exam: Comfortable, not in distress, deconditioned/debilitated HEENT:PERRL,Oral mucosa moist, Ear/Nose normal on gross exam Respiratory system: Mildly decreased air entry on the left side, no wheezes or crackles Cardiovascular system: S1 & S2 heard, RRR. No JVD, murmurs, rubs, gallops or  clicks. Gastrointestinal system: Abdomen is nondistended, soft and nontender. No organomegaly or masses felt. Normal bowel sounds heard. Central nervous system: Alert and oriented. No focal neurological deficits. Extremities: No edema, no clubbing ,no cyanosis, distal peripheral pulses palpable.  AV graft on the left arm Skin: No rashes, lesions or ulcers,no icterus ,no pallor   Data Reviewed: I have personally reviewed following labs and imaging studies  CBC: Recent Labs  Lab 02/04/19 0814 02/04/19 1452 02/05/19 0533 02/07/19 0634 02/09/19 1442 02/10/19 0509  WBC 5.4  --  8.2 13.2* 14.7* 8.1  NEUTROABS 3.6  --  5.9 11.2*  --  6.6  HGB 7.0* 9.0* 8.4* 9.3* 8.0* 8.9*  HCT 22.2* 27.8* 26.2* 29.6* 25.3* 28.5*  MCV 78.7*  --  80.9 82.2 81.1 81.7  PLT 250  --  249 276 268 829   Basic Metabolic Panel: Recent Labs  Lab 02/04/19 0814 02/07/19 0634 02/09/19 1442 02/10/19 0509  NA 132* 135 128* 132*  K 3.7 4.1 4.5 3.9  CL 95* 96* 91* 94*  CO2 22 24 24 25   GLUCOSE 144* 115* 122* 166*  BUN 39* 21 63* 27*  CREATININE 6.54* 4.15* 7.74* 4.73*  CALCIUM 8.9 8.9 8.6* 8.8*  PHOS  --  2.7 5.3*  --  GFR: Estimated Creatinine Clearance: 14.1 mL/min (A) (by C-G formula based on SCr of 4.73 mg/dL (H)). Liver Function Tests: Recent Labs  Lab 02/07/19 0634 02/09/19 1442  ALBUMIN 2.5* 2.2*   No results for input(s): LIPASE, AMYLASE in the last 168 hours. No results for input(s): AMMONIA in the last 168 hours. Coagulation Profile: No results for input(s): INR, PROTIME in the last 168 hours. Cardiac Enzymes: No results for input(s): CKTOTAL, CKMB, CKMBINDEX, TROPONINI in the last 168 hours. BNP (last 3 results) No results for input(s): PROBNP in the last 8760 hours. HbA1C: No results for input(s): HGBA1C in the last 72 hours. CBG: Recent Labs  Lab 02/09/19 2012 02/10/19 0008 02/10/19 0411 02/10/19 0717 02/10/19 1119  GLUCAP 171* 128* 162* 184* 138*   Lipid Profile: No  results for input(s): CHOL, HDL, LDLCALC, TRIG, CHOLHDL, LDLDIRECT in the last 72 hours. Thyroid Function Tests: No results for input(s): TSH, T4TOTAL, FREET4, T3FREE, THYROIDAB in the last 72 hours. Anemia Panel: No results for input(s): VITAMINB12, FOLATE, FERRITIN, TIBC, IRON, RETICCTPCT in the last 72 hours. Sepsis Labs: No results for input(s): PROCALCITON, LATICACIDVEN in the last 168 hours.  No results found for this or any previous visit (from the past 240 hour(s)).       Radiology Studies: No results found.      Scheduled Meds:  amLODipine  5 mg Oral BID   aspirin  81 mg Oral Daily   budesonide  0.25 mg Nebulization BID   carvedilol  25 mg Oral BID   Chlorhexidine Gluconate Cloth  6 each Topical Q0600   cloNIDine  0.3 mg Oral BID   darbepoetin (ARANESP) injection - DIALYSIS  60 mcg Intravenous Q Tue-HD   doxercalciferol  1 mcg Intravenous Q T,Th,Sa-HD   feeding supplement (NEPRO CARB STEADY)  237 mL Oral BID BM   feeding supplement (PRO-STAT SUGAR FREE 64)  30 mL Oral BID   ferrous sulfate  325 mg Oral Daily   FLUoxetine  20 mg Oral Daily   heparin  5,000 Units Subcutaneous Q8H   hydrALAZINE  100 mg Oral TID   insulin aspart  0-9 Units Subcutaneous Q4H   lisinopril  10 mg Oral Daily   mouth rinse  15 mL Mouth Rinse BID   montelukast  10 mg Oral Daily   multivitamin  1 tablet Oral QHS   nystatin  5 mL Oral QID   pantoprazole  40 mg Oral Q0600   Continuous Infusions:  sodium chloride 250 mL (02/01/19 0928)     LOS: 13 days    Time spent: 35 mins.More than 50% of that time was spent in counseling and/or coordination of care.      Shelly Coss, MD [facility] Pager [phone]  If 7PM-7AM, please contact night-coverage [email or web address] Password [email or web address] 02/10/2019, 2:10 PM

## 2019-02-11 ENCOUNTER — Other Ambulatory Visit: Payer: Self-pay

## 2019-02-11 LAB — RENAL FUNCTION PANEL
Albumin: 2.5 g/dL — ABNORMAL LOW (ref 3.5–5.0)
Anion gap: 13 (ref 5–15)
BUN: 47 mg/dL — ABNORMAL HIGH (ref 8–23)
CO2: 25 mmol/L (ref 22–32)
Calcium: 8.9 mg/dL (ref 8.9–10.3)
Chloride: 92 mmol/L — ABNORMAL LOW (ref 98–111)
Creatinine, Ser: 6.87 mg/dL — ABNORMAL HIGH (ref 0.61–1.24)
GFR calc Af Amer: 9 mL/min — ABNORMAL LOW (ref >60)
GFR calc non Af Amer: 8 mL/min — ABNORMAL LOW (ref >60)
Glucose, Bld: 131 mg/dL — ABNORMAL HIGH (ref 70–99)
Phosphorus: 4 mg/dL (ref 2.5–4.6)
Potassium: 3.9 mmol/L (ref 3.5–5.1)
Sodium: 130 mmol/L — ABNORMAL LOW (ref 135–145)

## 2019-02-11 LAB — CBC
HCT: 28.3 % — ABNORMAL LOW (ref 39.0–52.0)
Hemoglobin: 8.9 g/dL — ABNORMAL LOW (ref 13.0–17.0)
MCH: 25.6 pg — ABNORMAL LOW (ref 26.0–34.0)
MCHC: 31.4 g/dL (ref 30.0–36.0)
MCV: 81.3 fL (ref 80.0–100.0)
Platelets: 297 10*3/uL (ref 150–400)
RBC: 3.48 MIL/uL — ABNORMAL LOW (ref 4.22–5.81)
RDW: 15.4 % (ref 11.5–15.5)
WBC: 4.5 10*3/uL (ref 4.0–10.5)
nRBC: 0 % (ref 0.0–0.2)

## 2019-02-11 LAB — GLUCOSE, CAPILLARY
Glucose-Capillary: 105 mg/dL — ABNORMAL HIGH (ref 70–99)
Glucose-Capillary: 114 mg/dL — ABNORMAL HIGH (ref 70–99)
Glucose-Capillary: 116 mg/dL — ABNORMAL HIGH (ref 70–99)
Glucose-Capillary: 159 mg/dL — ABNORMAL HIGH (ref 70–99)
Glucose-Capillary: 168 mg/dL — ABNORMAL HIGH (ref 70–99)
Glucose-Capillary: 174 mg/dL — ABNORMAL HIGH (ref 70–99)
Glucose-Capillary: 178 mg/dL — ABNORMAL HIGH (ref 70–99)

## 2019-02-11 MED ORDER — DOXERCALCIFEROL 4 MCG/2ML IV SOLN
INTRAVENOUS | Status: AC
  Filled 2019-02-11: qty 2

## 2019-02-11 MED ORDER — HEPARIN SODIUM (PORCINE) 1000 UNIT/ML DIALYSIS: 20.0000 [IU]/kg | INTRAMUSCULAR | Status: DC | PRN

## 2019-02-11 NOTE — Plan of Care (Signed)
  Problem: Education: Goal: Knowledge of General Education information will improve Description Including pain rating scale, medication(s)/side effects and non-pharmacologic comfort measures Outcome: Progressing   

## 2019-02-11 NOTE — Plan of Care (Signed)
  Problem: Activity: Goal: Risk for activity intolerance will decrease Outcome: Progressing   

## 2019-02-11 NOTE — Progress Notes (Signed)
Patient ID: Brodan Ragosta, male   DOB: 03/07/1956, 63 y.o.   MRN: [id number] [facility] Progress Note   Assessment/ Plan:   1.  Acute on chronic pleural effusion status post VATS: Status post completion of antibiotics with Levaquin and vancomycin, remains afebrile with stable respiratory status.  Plans noted for outpatient follow-up with [facility] thoracic surgery. 2. ESRD: Continue hemodialysis on a TTS schedule via AVF ( normally done as OP in [place] ) with next dialysis due today; he is euvolemic on exam and does not have any uremic type symptoms or signs.  Plans noted for possible admission to CIR, now SNF- search has been difficult. 3. Anemia: Iron stores are replete and he does not have any overt blood loss.  Await CBC to monitor trend. 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 No binder- cont home hectorol 5. 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 6. Hypertension:  Now on 5 drug reg with high reading- UF with HD today   Dialysis Pres[facility] [place], Optiflux 180, EDW 77kg, 3hr 28min, BFR 450/DFR 800, 2K/2.5Ca, Epogen 10,000 units TIW, Hectorol 42mcg TIW, ONS, LUA AVF 15G, Standard Sodium, Heparin 2000units load and 700units/hr  Subjective:   No c/os due for dialysis later today    Objective:   BP (!) 155/74 (BP Location: Right Arm)   Pulse 77   Temp 98.2 F (36.8 C) (Oral)   Resp 16   Ht 5\' 11"  (1.803 m)   Wt 61.6 kg   SpO2 99%   BMI 18.94 kg/m   Physical Exam: Gen: In bedside chair-  No c/o's  CVS: Pulse regular rhythm, normal rate, S1 and S2 normal Resp: Clear to auscultation, no rales/rhonchi Abd: Soft, flat, nontender Ext: Left upper arm AV fistula with positive thrill.  No pedal edema  Labs: BMET Recent Labs  Lab 02/07/19 0634 02/09/19 1442 02/10/19 0509  NA 135 128* 132*  K 4.1 4.5 3.9  CL 96*  91* 94*  CO2 24 24 25   GLUCOSE 115* 122* 166*  BUN 21 63* 27*  CREATININE 4.15* 7.74* 4.73*  CALCIUM 8.9 8.6* 8.8*  PHOS 2.7 5.3*  --    CBC Recent Labs  Lab 02/05/19 0533 02/07/19 0634 02/09/19 1442 02/10/19 0509  WBC 8.2 13.2* 14.7* 8.1  NEUTROABS 5.9 11.2*  --  6.6  HGB 8.4* 9.3* 8.0* 8.9*  HCT 26.2* 29.6* 25.3* 28.5*  MCV 80.9 82.2 81.1 81.7  PLT 249 276 268 299   Medications:    . amLODipine  5 mg Oral BID  . aspirin  81 mg Oral Daily  . budesonide  0.25 mg Nebulization BID  . carvedilol  25 mg Oral BID  . Chlorhexidine Gluconate Cloth  6 each Topical Q0600  . Chlorhexidine Gluconate Cloth  6 each Topical Q0600  . cloNIDine  0.3 mg Oral BID  . darbepoetin (ARANESP) injection - DIALYSIS  60 mcg Intravenous Q Tue-HD  . doxercalciferol  1 mcg Intravenous Q T,Th,Sa-HD  . feeding supplement (NEPRO CARB STEADY)  237 mL Oral BID BM  . feeding supplement (PRO-STAT SUGAR FREE 64)  30 mL Oral BID  . ferrous sulfate  325 mg Oral Daily  . FLUoxetine  20 mg Oral Daily  . heparin  5,000 Units Subcutaneous Q8H  . hydrALAZINE  100 mg Oral TID  . insulin aspart  0-9 Units Subcutaneous Q4H  . lisinopril  10 mg  Oral Daily  . mouth rinse  15 mL Mouth Rinse BID  . montelukast  10 mg Oral Daily  . multivitamin  1 tablet Oral QHS  . nystatin  5 mL Oral QID  . pantoprazole  40 mg Oral Q0600   Floy Riegler A Hasson Gaspard  02/11/2019, 9:35 AM

## 2019-02-11 NOTE — Progress Notes (Signed)
PROGRESS NOTE    Cole Nelson  [id number] DOB: 03/31/1956 DOA: 01/28/2019 PCP: Patient, No Pcp Per   Brief Narrative: Patient is a 63 year old male with history of ESRD on dialysis, diabetes mellitus, tobacco abuse, diabetes gastroparesis, GERD, hypertension, diastolic heart failure, multiple sclerosis, CVA with residual left-sided weakness, chronic left pleural effusion, chronic anemia who was admitted on 7/11 at [facility] with acute shortness of breath and hypertensive urgency.  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  Patient has been transferred to hospital service.  Currently he is hemodynamically stable.  He is being dialysed   PT/OT evaluated him and recommended CIR on discharge.His insurance denied CIR.Plan is for SNF now. SW following. Patient is medically stable for discharge to SNF as soon as the bed is available.  Assessment & Plan:   Active Problems:   Acute encephalopathy   Pressure injury of skin   Pleural effusion   End-stage renal disease on hemodialysis (HCC)   Protein-calorie malnutrition, severe   Nausea and vomiting   Acute gastric erosion   Abnormal CT scan, small bowel   Acute on chronic pleural effusion: Status post left VATS.  Concern for pleural mass versus infectious consolidation.  Currently he is hemodynamically stable.  Not septic.  Currently on room air.  Blood cultures no growth till date.  Pleural fluid cytology did not show any neoplasia.Repeat CT chest without contrast for follow-up showed chronic large complex left hydropneumothorax (currently in no respiratory distress, off oxygen saturating well). Completed antibiotics course. The case was discussed with cardiothoracic at [facility] and felt that this is acute consolidation rather than a mass and recommended further outpatient follow up after completion of  antibiotic therapy.  Plan is to follow-up with cardiothoracic surgeons at [facility] thoracic surgery)  as an outpatient once discharged.  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  Tolerating diet.  CT showed no clear evidence of SMA syndrome.  General surgery was following.  GI was also following and underwent enteroscopy on 01/31/2019 which showed food residue in the stomach consistent with gastroparesis, gastric erosions, status post biopsy.  No evidence of inflammation, stricture.  GI recommended gastroparesis diet, Reglan, daily PPI.  ESRD on dialysis: Dialyzed on TTS.  Nephrology following.  Hypertension/diastolic heart failure: Currently euvolemic.  Echo showed hyperdynamic left ventricular ejection fraction.  Blood pressure stable.  Continue current regimen.  Anemia of chronic disease:   He was transfused with 1 unit of PRBC on 02/03/19.Hb stable  Diabetes type 2: Continue sliding scale insulin.  Home regimen on hold.  Multiple sclerosis: Stable  GERD: Continue PPI  Debility/Deconditioning: Patient evaluated PT and OT and recommended CIR on discharge.  CIR consulted and he waited for several days but his insurance has denied and now recommended SNF.  PT/OT evaluations performed. SNF recommended. SNF appropriate as the patient has received 3 days of hospital care (or the 3 day period has been waived by the patient's insurance company) and is felt to need rehab services to restore this patient to their prior level of function to achieve safe transition back to home care.. Rehab is being requested as the most appropriate d/c option for this patient and is NOT felt to be for custodial care as evidenced by improvement on his overall status by physical therapy   Nutrition Problem: Severe Malnutrition Etiology: chronic illness(ESRD on HD, CHF)  DVT prophylaxis: heparin [place] Code Status: Full Family Communication: Wife called on 02/11/19 Disposition  Plan: SNF as soon as bed is available   Consultants: PCCM, nephrology, GI  Procedures: Hemodialysis, enteroscopy  Antimicrobials:  Anti-infectives (From admission, onward)   Start     Dose/Rate Route Frequency Ordered Stop   02/02/19 1200  vancomycin (VANCOCIN) IVPB 750 mg/150 ml premix     750 mg 150 mL/hr over 60 Minutes Intravenous Every T-Th-Sa (Hemodialysis) 01/31/19 0846 02/05/19 0649   02/01/19 0800  vancomycin (VANCOCIN) IVPB 750 mg/150 ml premix     750 mg 150 mL/hr over 60 Minutes Intravenous  Once 02/01/19 0753 02/01/19 1030   01/28/19 0330  levofloxacin (LEVAQUIN) IVPB 500 mg     500 mg 100 mL/hr over 60 Minutes Intravenous Every 48 hours 01/28/19 0323 02/03/19 0451   01/28/19 0323  vancomycin variable dose per unstable renal function (pharmacist dosing)  Status:  Discontinued      Does not apply See admin instructions 01/28/19 0323 02/01/19 1400      Subjective:  Patient seen and examined the bedside this morning.  Hemodynamically stable.  Lying on the bed, sleeping as always.  Looks very weak and deconditioned.  No active issues.  I talked to his wife again on phone and updated everything.  I also told her that he needs to follow-up with [facility] cardiothoracic surgery as an outpatient when he is discharged from here.  Hopefully his PCP can arrange a follow-up there .  Objective: Vitals:   02/10/19 2039 02/11/19 0435 02/11/19 0821 02/11/19 0831  BP:  (!) 176/79 (!) 155/74   Pulse:  79 81 77  Resp:  17 16 16   Temp:  98.2 F (36.8 C) 98.2 F (36.8 C)   TempSrc:   Oral   SpO2: 94% 96% 100% 99%  Weight:      Height:        Intake/Output Summary (Last 24 hours) at 02/11/2019 1155 Last data filed at 02/11/2019 0601 Gross per 24 hour  Intake 180 ml  Output 0 ml  Net 180 ml   Filed Weights   02/09/19 1714 02/10/19 0437 02/10/19 2019  Weight: 61.5 kg 61.5 kg 61.6 kg    Examination:  General exam: Comfortable  ,Not in distress, deconditioned/debilitated  HEENT:PERRL,Oral mucosa moist, Ear/Nose normal on gross exam Respiratory system: Decreased air entry on the left base, no wheezes or crackles Cardiovascular system: S1 & S2 heard, RRR. No JVD, murmurs, rubs, gallops or clicks. Gastrointestinal system: Abdomen is nondistended, soft and nontender. No organomegaly or masses felt. Normal bowel sounds heard. Central nervous system: Alert and oriented. No focal neurological deficits. Extremities: No edema, no clubbing ,no cyanosis, distal peripheral pulses palpable.  AV graft on the left arm Skin: No rashes, lesions or ulcers,no icterus ,no pallor   Data Reviewed: I have personally reviewed following labs and imaging studies  CBC: Recent Labs  Lab 02/04/19 1452 02/05/19 0533 02/07/19 0634 02/09/19 1442 02/10/19 0509  WBC  --  8.2 13.2* 14.7* 8.1  NEUTROABS  --  5.9 11.2*  --  6.6  HGB 9.0* 8.4* 9.3* 8.0* 8.9*  HCT 27.8* 26.2* 29.6* 25.3* 28.5*  MCV  --  80.9 82.2 81.1 81.7  PLT  --  249 276 268 [id number]   Basic Metabolic Panel: Recent Labs  Lab 02/07/19 0634 02/09/19 1442 02/10/19 0509  NA 135 128* 132*  K 4.1 4.5 3.9  CL 96* 91* 94*  CO2 24 24 25   GLUCOSE 115*  122* 166*  BUN 21 63* 27*  CREATININE 4.15* 7.74* 4.73*  CALCIUM 8.9 8.6* 8.8*  PHOS 2.7 5.3*  --    GFR: Estimated Creatinine Clearance: 14.1 mL/min (A) (by C-G formula based on SCr of 4.73 mg/dL (H)). Liver Function Tests: Recent Labs  Lab 02/07/19 0634 02/09/19 1442  ALBUMIN 2.5* 2.2*   No results for input(s): LIPASE, AMYLASE in the last 168 hours. No results for input(s): AMMONIA in the last 168 hours. Coagulation Profile: No results for input(s): INR, PROTIME in the last 168 hours. Cardiac Enzymes: No results for input(s): CKTOTAL, CKMB, CKMBINDEX, TROPONINI in the last 168 hours. BNP (last 3 results) No results for input(s): PROBNP in the last 8760 hours. HbA1C: No results for input(s): HGBA1C in the last 72 hours. CBG: Recent Labs  Lab 02/10/19  2019 02/11/19 0004 02/11/19 0434 02/11/19 0716 02/11/19 1126  GLUCAP 84 168* 114* 116* 174*   Lipid Profile: No results for input(s): CHOL, HDL, LDLCALC, TRIG, CHOLHDL, LDLDIRECT in the last 72 hours. Thyroid Function Tests: No results for input(s): TSH, T4TOTAL, FREET4, T3FREE, THYROIDAB in the last 72 hours. Anemia Panel: No results for input(s): VITAMINB12, FOLATE, FERRITIN, TIBC, IRON, RETICCTPCT in the last 72 hours. Sepsis Labs: No results for input(s): PROCALCITON, LATICACIDVEN in the last 168 hours.  No results found for this or any previous visit (from the past 240 hour(s)).       Radiology Studies: No results found.      Scheduled Meds: . amLODipine  5 mg Oral BID  . aspirin  81 mg Oral Daily  . budesonide  0.25 mg Nebulization BID  . carvedilol  25 mg Oral BID  . Chlorhexidine Gluconate Cloth  6 each Topical Q0600  . Chlorhexidine Gluconate Cloth  6 each Topical Q0600  . cloNIDine  0.3 mg Oral BID  . darbepoetin (ARANESP) injection - DIALYSIS  60 mcg Intravenous Q Tue-HD  . doxercalciferol  1 mcg Intravenous Q T,Th,Sa-HD  . feeding supplement (NEPRO CARB STEADY)  237 mL Oral BID BM  . feeding supplement (PRO-STAT SUGAR FREE 64)  30 mL Oral BID  . ferrous sulfate  325 mg Oral Daily  . FLUoxetine  20 mg Oral Daily  . heparin  5,000 Units Subcutaneous Q8H  . hydrALAZINE  100 mg Oral TID  . insulin aspart  0-9 Units Subcutaneous Q4H  . lisinopril  10 mg Oral Daily  . mouth rinse  15 mL Mouth Rinse BID  . montelukast  10 mg Oral Daily  . multivitamin  1 tablet Oral QHS  . nystatin  5 mL Oral QID  . pantoprazole  40 mg Oral Q0600   Continuous Infusions: . sodium chloride 250 mL (02/01/19 0928)     LOS: 14 days    Time spent: 35 mins.More than 50% of that time was spent in counseling and/or coordination of care.      Shelly Coss, MD [facility] Pager [phone]  If 7PM-7AM, please contact night-coverage [email or web address] Password  [email or web address] 02/11/2019, 11:55 AM

## 2019-02-11 NOTE — TOC Progression Note (Signed)
Transition of Care [facility]) - Progression Note    Patient Details  Name: Cole Nelson MRN: [id number] Date of Birth: Mar 14, 1956  [facility] CM/SW Contact  Sharlet Salina Mila Homer, LCSW Phone Number: 02/11/2019, 5:37 PM  Clinical Narrative:  Talked with wife and patient at bedside regarding SNF search and informed them that a facility has not been found, however the search continues. Per wife, can extend search into [place]: [facility] or [facility]. Wife aware that if a facility in [place] can take patient, they will go with that facility and CSW was informed of the [place] SNF's that Mrs. Relaford does not want her husband to discharge to. Wife advised that patient is medically ready for discharge.     Expected Discharge Plan: [place] Barriers to Discharge: SNF Pending bed offer(Seeking a bed in [place] or nearby facility in [place] for [place] rehab)  Expected Discharge Plan and Services Expected Discharge Plan: [place] arrangements for the past 2 months: Single Family Home                                     Social Determinants of Health (SDOH) Interventions  No SDOH interventions needed at this time  Readmission Risk Interventions No flowsheet data found.

## 2019-02-12 LAB — CBC WITH DIFFERENTIAL/PLATELET
Abs Immature Granulocytes: 0.01 10*3/uL (ref 0.00–0.07)
Basophils Absolute: 0.1 10*3/uL (ref 0.0–0.1)
Basophils Relative: 1 %
Eosinophils Absolute: 0.2 10*3/uL (ref 0.0–0.5)
Eosinophils Relative: 4 %
HCT: 29.8 % — ABNORMAL LOW (ref 39.0–52.0)
Hemoglobin: 9.2 g/dL — ABNORMAL LOW (ref 13.0–17.0)
Immature Granulocytes: 0 %
Lymphocytes Relative: 10 %
Lymphs Abs: 0.5 10*3/uL — ABNORMAL LOW (ref 0.7–4.0)
MCH: 25.3 pg — ABNORMAL LOW (ref 26.0–34.0)
MCHC: 30.9 g/dL (ref 30.0–36.0)
MCV: 81.9 fL (ref 80.0–100.0)
Monocytes Absolute: 0.7 10*3/uL (ref 0.1–1.0)
Monocytes Relative: 16 %
Neutro Abs: 3.2 10*3/uL (ref 1.7–7.7)
Neutrophils Relative %: 69 %
Platelets: 304 10*3/uL (ref 150–400)
RBC: 3.64 MIL/uL — ABNORMAL LOW (ref 4.22–5.81)
RDW: 15.1 % (ref 11.5–15.5)
WBC: 4.7 10*3/uL (ref 4.0–10.5)
nRBC: 0 % (ref 0.0–0.2)

## 2019-02-12 LAB — GLUCOSE, CAPILLARY
Glucose-Capillary: 97 mg/dL (ref 70–99)
Glucose-Capillary: 100 mg/dL — ABNORMAL HIGH (ref 70–99)
Glucose-Capillary: 144 mg/dL — ABNORMAL HIGH (ref 70–99)
Glucose-Capillary: 170 mg/dL — ABNORMAL HIGH (ref 70–99)
Glucose-Capillary: 113 mg/dL — ABNORMAL HIGH (ref 70–99)

## 2019-02-12 MED ORDER — CHLORHEXIDINE GLUCONATE CLOTH 2 % EX PADS
6.0000 | MEDICATED_PAD | Freq: Every day | CUTANEOUS | Status: DC
  Administered 2019-02-13 – 2019-02-16 (×2): 6 via TOPICAL

## 2019-02-12 NOTE — Plan of Care (Signed)
?  Problem: Clinical Measurements: ?Goal: Will remain free from infection ?Outcome: Progressing ?  ?

## 2019-02-12 NOTE — Progress Notes (Addendum)
Occupational Therapy Treatment Patient Details Name: Cole Nelson MRN: [id number] DOB: [birth year] Today's Date: 02/12/2019    History of present illness 63 year old male with history of ESRD on TTS- iHD via LUE AVF, DM, tobacco abuse, diabetic gastroparesis, GERD, HTN, diastolic HF, multiple sclerosis, CVA w/residual left sided weakness, chronic left pleural effusion, hx cdiff, diabetic neuropathy, anemia, HLD, and depression, was admitted on 7/11 to [facility] with acute shortness of breath and hypertensive urgency.  Found on admit to have complete opacification of left hemithorax. Acute on chronic pleural effusions s/p left VATS, concern for pleural mass vs infectious consolidation   OT comments  Pt continues to progress and motivated to participate with OT. Pt performing bed mobility with minA from supine to sitting with L lateral lean requiring cues to sit upright. Pt sit to stand with minA overall and modA overall for stability with RW. Pt performing transfer to commode for light ADL and ambulating with RW to sink and leaning on LUE. Pt returned to recliner with cues for hand placement for safety. LLE dragging and requires cues to pick up with mobility. Pt would greatly benefit from continued OT skilled services for ADL, mobility and safety in SNF setting. OT following acutely.    Follow Up Recommendations  SNF;Supervision/Assistance - 24 hour    Equipment Recommendations  3 in 1 bedside commode    Recommendations for Other Services      Precautions / Restrictions Precautions Precautions: Fall Restrictions Weight Bearing Restrictions: No       Mobility Bed Mobility Overal bed mobility: Needs Assistance Bed Mobility: Supine to Sit     Supine to sit: Min assist     General bed mobility comments: minA to scoot towards EOB  Transfers Overall transfer level: Needs assistance Equipment used: Rolling walker (2 wheeled) Transfers: Sit to/from Stand Sit to Stand:  Min assist         General transfer comment: increased effort and time;cues for safe hand placement. assist to steady during powerup/descent.    Balance     Sitting balance-Leahy Scale: Fair Sitting balance - Comments: cues to sit upright to avoid leaning on L side     Standing balance-Leahy Scale: Poor Standing balance comment: reliant on Bil. UE support                            ADL either performed or assessed with clinical judgement   ADL Overall ADL's : Needs assistance/impaired Eating/Feeding: Set up;Sitting Eating/Feeding Details (indicate cue type and reason): reaching for ensure after washing face Grooming: Minimal assistance;Standing;Cueing for safety;Cueing for sequencing Grooming Details (indicate cue type and reason): washing face and hands                 Toilet Transfer: Minimal assistance;Ambulation;RW;Cueing for safety;Cueing for sequencing           Functional mobility during ADLs: Minimal assistance;Rolling walker General ADL Comments: Pt fatigues easily. Pt ambulating from bed to bathroom ~15' and then to recliner x15' with minA for stability with RW. Pt with poor standing tolerance at sink leaning on L elbow.     Vision   Vision Assessment?: No apparent visual deficits   Perception     Praxis      Cognition Arousal/Alertness: Awake/alert Behavior During Therapy: WFL for tasks assessed/performed Overall Cognitive Status: No family/caregiver present to determine baseline cognitive functioning Area of Impairment: Attention;Safety/judgement;Awareness;Problem solving  Orientation Level: Disoriented to;Place;Time;Situation Current Attention Level: Sustained   Following Commands: Follows one step commands consistently;Follows one step commands with increased time Safety/Judgement: Decreased awareness of safety;Decreased awareness of deficits Awareness: Intellectual Problem Solving: Slow processing;Decreased  initiation;Difficulty sequencing General Comments: Pt with history during hospitalization of incontinence of stool        Exercises     Shoulder Instructions       General Comments HR 80 BPM throughout task.    Pertinent Vitals/ Pain       Pain Assessment: No/denies pain  Home Living                                          Prior Functioning/Environment              Frequency  Min 2X/week        Progress Toward Goals  OT Goals(current goals can now be found in the care plan section)  Progress towards OT goals: Progressing toward goals  Acute Rehab OT Goals Patient Stated Goal: go home OT Goal Formulation: With patient Time For Goal Achievement: 02/15/19 Potential to Achieve Goals: Good ADL Goals Pt Will Perform Grooming: with min assist;standing Pt Will Perform Lower Body Bathing: with min assist;sit to/from stand Pt Will Perform Lower Body Dressing: with min assist;sit to/from stand Pt Will Transfer to Toilet: with min assist;ambulating;regular height toilet Pt Will Perform Toileting - Clothing Manipulation and hygiene: with min guard assist;sit to/from stand  Plan Discharge plan remains appropriate    Co-evaluation                 AM-PAC OT "6 Clicks" Daily Activity     Outcome Measure   Help from another person eating meals?: A Little Help from another person taking care of personal grooming?: A Little Help from another person toileting, which includes using toliet, bedpan, or urinal?: A Little Help from another person bathing (including washing, rinsing, drying)?: A Lot Help from another person to put on and taking off regular upper body clothing?: A Little Help from another person to put on and taking off regular lower body clothing?: A Little 6 Click Score: 17    End of Session Equipment Utilized During Treatment: Gait belt;Rolling walker  OT Visit Diagnosis: Unsteadiness on feet (R26.81);Other abnormalities of gait and  mobility (R26.89);Muscle weakness (generalized) (M62.81)   Activity Tolerance Patient limited by fatigue;Patient tolerated treatment well   Patient Left in bed;with call bell/phone within reach;with bed alarm set   Nurse Communication Mobility status        Time: 1400-1420 OT Time Calculation (min): 20 min  Charges: OT General Charges $OT Visit: 1 Visit OT Treatments $Self Care/Home Management : 8-22 mins  Darryl Nestle) Marsa Aris OTR/L Acute Rehabilitation Services Pager: [phone] Office: [phone]    Audie Pinto 02/12/2019, 2:41 PM

## 2019-02-12 NOTE — Progress Notes (Signed)
Physical Therapy Treatment Patient Details Name: Cole Nelson MRN: [id number] DOB: April 25, 1956 Today's Date: 02/12/2019    History of Present Illness 63 year old male with history of ESRD on TTS- iHD via LUE AVF, DM, tobacco abuse, diabetic gastroparesis, GERD, HTN, diastolic HF, multiple sclerosis, CVA w/residual left sided weakness, chronic left pleural effusion, hx cdiff, diabetic neuropathy, anemia, HLD, and depression, was admitted on 7/11 to [facility] with acute shortness of breath and hypertensive urgency.  Found on admit to have complete opacification of left hemithorax. Acute on chronic pleural effusions s/p left VATS, concern for pleural mass vs infectious consolidation    PT Comments    Worked with patient on transfers today, standing and static balance. Pt hands on assist needed to stand and provide stability at times. Cont to rec CIR, however they have declined. Update recs to SNF as next best option.   Follow Up Recommendations  SNF (CIR has denied).      Equipment Recommendations  Rolling walker with 5" wheels;3in1 (PT)    Recommendations for Other Services Rehab consult     Precautions / Restrictions Precautions Precautions: Fall Restrictions Weight Bearing Restrictions: No    Mobility  Bed Mobility Overal bed mobility: Needs Assistance Bed Mobility: Supine to Sit     Supine to sit: Min assist     General bed mobility comments: pt just to share prior to PT visit, siting there at [employer] Overall transfer level: Needs assistance Equipment used: Rolling walker (2 wheeled) Transfers: Sit to/from Stand Sit to Stand: Min assist         General transfer comment: x5 sit to stand, patient with premature sit and poor balance back from RW onto chair x1. Min A   Ambulation/Gait                 Stairs             Wheelchair Mobility    Modified Rankin (Stroke Patients Only)       Balance     Sitting balance-Leahy  Scale: Fair Sitting balance - Comments: cues to sit upright to avoid leaning on L side     Standing balance-Leahy Scale: Poor Standing balance comment: reliant on Bil. UE support                             Cognition Arousal/Alertness: Awake/alert Behavior During Therapy: WFL for tasks assessed/performed Overall Cognitive Status: No family/caregiver present to determine baseline cognitive functioning Area of Impairment: Attention;Safety/judgement;Awareness;Problem solving                 Orientation Level: Disoriented to;Place;Time;Situation Current Attention Level: Sustained   Following Commands: Follows one step commands consistently;Follows one step commands with increased time Safety/Judgement: Decreased awareness of safety;Decreased awareness of deficits Awareness: Intellectual Problem Solving: Slow processing;Decreased initiation;Difficulty sequencing General Comments: Pt with history during hospitalization of incontinence of stool      Exercises      General Comments General comments (skin integrity, edema, etc.): HR 80 BPM throughout task.      Pertinent Vitals/Pain Pain Assessment: No/denies pain    Home Living                      Prior Function            PT Goals (current goals can now be found in the care plan section) Acute Rehab PT Goals Patient Stated Goal: go  home PT Goal Formulation: With patient Time For Goal Achievement: 02/14/19 Potential to Achieve Goals: Good Progress towards PT goals: Progressing toward goals    Frequency    Min 3X/week      PT Plan Current plan remains appropriate    Co-evaluation              AM-PAC PT "6 Clicks" Mobility   Outcome Measure  Help needed turning from your back to your side while in a flat bed without using bedrails?: A Little Help needed moving from lying on your back to sitting on the side of a flat bed without using bedrails?: A Little Help needed moving to and  from a bed to a chair (including a wheelchair)?: A Little Help needed standing up from a chair using your arms (e.g., wheelchair or bedside chair)?: A Lot Help needed to walk in hospital room?: A Lot Help needed climbing 3-5 steps with a railing? : A Lot 6 Click Score: 15    End of Session Equipment Utilized During Treatment: Gait belt Activity Tolerance: Patient tolerated treatment well Patient left: in chair;with call bell/phone within reach;with chair alarm set Nurse Communication: Mobility status PT Visit Diagnosis: Other abnormalities of gait and mobility (R26.89);Muscle weakness (generalized) (M62.81)     Time: [id number] PT Time Calculation (min) (ACUTE ONLY): 14 min  Charges:  $Therapeutic Activity: 8-22 mins                     Reinaldo Berber, PT, DPT Acute Rehabilitation Services Pager: [phone] Office: [phone]     Reinaldo Berber 02/12/2019, 3:55 PM

## 2019-02-12 NOTE — Progress Notes (Addendum)
PROGRESS NOTE    Cole Nelson  [id number] DOB: [birth year] DOA: 01/28/2019 PCP: Patient, No Pcp Per    Brief Narrative:  63 year old male who presented to [facility] July 11 with dyspnea, hypoxia and chronic pleural effusion status post left VATS and question positive mass, ongoing nausea and vomiting.  He does have history of cirrhosis on hemodialysis, type 2 diabetes mellitus, GERD, hypertension, diastolic heart failure, multiple sclerosis, history of CVA, chronic left pleural effusion, anemia and depression. Patient had persistent nausea and vomiting, CTA of the abdomen showed gastric distention and dilatation of the second and proximal third portion of the duodenum possible narrowing at the level of the hiatus between the aorta and sputum splenic artery consistent with SMA syndrome.  Underwent further work-up with enteroscopy July 26 which showed gastroparesis with gastric erosions.  For his left pleural effusion patient has received antibiotic therapy, plan to follow-up as an outpatient with thoracic surgery.  Patient currently waiting for placement at skilled nursing facility.  Assessment & Plan:   Active Problems:   Acute encephalopathy   Pressure injury of skin   Pleural effusion   End-stage renal disease on hemodialysis (HCC)   Protein-calorie malnutrition, severe   Nausea and vomiting   Acute gastric erosion   Abnormal CT scan, small bowel   1. Acute on chronic left pleural effusion. No dyspnea this am, oxygenating well at 100% and 95% on room air. Patient will follow up as outpatient.   2. Gastroparesis. Patient now is tolerating po well, no further nausea or vomiting. SMA has been ruled out. Continue antimotility agents for now.   3. ESRD on HD. Patient has been tolerating HD well, continue with nephrology recommendations.  4. T2DM. Continue glucose cover and monitoring with insulin sliding scale.   5. Multiple sclerosis. Continue neuro checks per unit  protocol, physical therapy evaluation. Patient will need SNF.   6. Severe calorie protein malnutrition. Continue with nutritional supplements.   7. Right buttock stage 2 pressure ulcer present on admission. Continue local wound care.   8. HTN. Continue blood pressure control with amlodipine, clonidine, carvedilol, hydralazine and lisinopril.   9. Iron deficiency anemia. Continue iron supplementation.   DVT prophylaxis: enoxaparin   Code Status: full Family Communication: no family at the bedside  Disposition Plan/ discharge barriers: pending clinical improvement.   Body mass index is 18.6 kg/m. Malnutrition Type:  Nutrition Problem: Severe Malnutrition Etiology: chronic illness(ESRD on HD, CHF)   Malnutrition Characteristics:  Signs/Symptoms: severe muscle depletion, severe fat depletion   Nutrition Interventions:  Interventions: Boost Breeze, Prostat  RN Pressure Injury Documentation: Pressure Injury 01/28/19 Buttocks Right Stage II -  Partial thickness loss of dermis presenting as a shallow open ulcer with a red, pink wound bed without slough. (Active)  01/28/19 0120  Location: Buttocks  Location Orientation: Right  Staging: Stage II -  Partial thickness loss of dermis presenting as a shallow open ulcer with a red, pink wound bed without slough.  Wound Description (Comments):   Present on Admission: Yes     Consultants:   Nephrology   Procedures:     Antimicrobials:       Subjective: Patient continue to be very weak and deconditioned, no nausea or vomiting, tolerating po well, no dyspnea or chest pain.   Objective: Vitals:   02/11/19 2058 02/12/19 0353 02/12/19 0810 02/12/19 0922  BP:  (!) 162/70  (!) 170/79  Pulse:  72 76 79  Resp:  16 18 18   Temp:  98.4  F (36.9 C)  98.9 F (37.2 C)  TempSrc:  Oral  Oral  SpO2: 95% 97% 97% 95%  Weight:      Height:        Intake/Output Summary (Last 24 hours) at 02/12/2019 1459 Last data filed at 02/12/2019  1300 Gross per 24 hour  Intake 780 ml  Output 2901 ml  Net -2121 ml   Filed Weights   02/11/19 1221 02/11/19 1626 02/11/19 2007  Weight: 63.7 kg 60.8 kg 60.5 kg    Examination:   General: Not in pain or dyspnea, deconditioned  Neurology: Awake and alert, non focal  E ENT: no pallor, no icterus, oral mucosa moist Cardiovascular: No JVD. S1-S2 present, rhythmic, no gallops, rubs, or murmurs. No lower extremity edema. Pulmonary: positive breath sounds bilaterally, adequate air movement, no wheezing, rhonchi or rales. Gastrointestinal. Abdomen with no organomegaly, non tender, no rebound or guarding Skin. No rashes Musculoskeletal: no joint deformities     Data Reviewed: I have personally reviewed following labs and imaging studies  CBC: Recent Labs  Lab 02/07/19 0634 02/09/19 1442 02/10/19 0509 02/11/19 1333 02/12/19 0425  WBC 13.2* 14.7* 8.1 4.5 4.7  NEUTROABS 11.2*  --  6.6  --  3.2  HGB 9.3* 8.0* 8.9* 8.9* 9.2*  HCT 29.6* 25.3* 28.5* 28.3* 29.8*  MCV 82.2 81.1 81.7 81.3 81.9  PLT 276 268 299 297 [id number]   Basic Metabolic Panel: Recent Labs  Lab 02/07/19 0634 02/09/19 1442 02/10/19 0509 02/11/19 1333 02/12/19 0425  NA 135 128* 132* 130* 130*  K 4.1 4.5 3.9 3.9 3.7  CL 96* 91* 94* 92* 91*  CO2 24 24 25 25 28   GLUCOSE 115* 122* 166* 131* 95  BUN 21 63* 27* 47* 24*  CREATININE 4.15* 7.74* 4.73* 6.87* 4.34*  CALCIUM 8.9 8.6* 8.8* 8.9 8.8*  PHOS 2.7 5.3*  --  4.0  --    GFR: Estimated Creatinine Clearance: 15.1 mL/min (A) (by C-G formula based on SCr of 4.34 mg/dL (H)). Liver Function Tests: Recent Labs  Lab 02/07/19 0634 02/09/19 1442 02/11/19 1333  ALBUMIN 2.5* 2.2* 2.5*   No results for input(s): LIPASE, AMYLASE in the last 168 hours. No results for input(s): AMMONIA in the last 168 hours. Coagulation Profile: No results for input(s): INR, PROTIME in the last 168 hours. Cardiac Enzymes: No results for input(s): CKTOTAL, CKMB, CKMBINDEX, TROPONINI in  the last 168 hours. BNP (last 3 results) No results for input(s): PROBNP in the last 8760 hours. HbA1C: No results for input(s): HGBA1C in the last 72 hours. CBG: Recent Labs  Lab 02/11/19 2008 02/11/19 2358 02/12/19 0356 02/12/19 0718 02/12/19 1108  GLUCAP 178* 159* 97 100* 144*   Lipid Profile: No results for input(s): CHOL, HDL, LDLCALC, TRIG, CHOLHDL, LDLDIRECT in the last 72 hours. Thyroid Function Tests: No results for input(s): TSH, T4TOTAL, FREET4, T3FREE, THYROIDAB in the last 72 hours. Anemia Panel: No results for input(s): VITAMINB12, FOLATE, FERRITIN, TIBC, IRON, RETICCTPCT in the last 72 hours.    Radiology Studies: I have reviewed all of the imaging during this hospital visit personally     Scheduled Meds:  amLODipine  5 mg Oral BID   aspirin  81 mg Oral Daily   budesonide  0.25 mg Nebulization BID   carvedilol  25 mg Oral BID   Chlorhexidine Gluconate Cloth  6 each Topical Q0600   Chlorhexidine Gluconate Cloth  6 each Topical Q0600   [START ON 02/13/2019] Chlorhexidine Gluconate Cloth  6 each Topical  Q0600   cloNIDine  0.3 mg Oral BID   darbepoetin (ARANESP) injection - DIALYSIS  60 mcg Intravenous Q Tue-HD   doxercalciferol  1 mcg Intravenous Q T,Th,Sa-HD   feeding supplement (NEPRO CARB STEADY)  237 mL Oral BID BM   feeding supplement (PRO-STAT SUGAR FREE 64)  30 mL Oral BID   ferrous sulfate  325 mg Oral Daily   FLUoxetine  20 mg Oral Daily   heparin  5,000 Units Subcutaneous Q8H   hydrALAZINE  100 mg Oral TID   insulin aspart  0-9 Units Subcutaneous Q4H   lisinopril  10 mg Oral Daily   mouth rinse  15 mL Mouth Rinse BID   montelukast  10 mg Oral Daily   multivitamin  1 tablet Oral QHS   nystatin  5 mL Oral QID   pantoprazole  40 mg Oral Q0600   Continuous Infusions:  sodium chloride 250 mL (02/01/19 0928)     LOS: 15 days        Jorden Mahl Gerome Apley, MD

## 2019-02-12 NOTE — Clinical Social Work Note (Addendum)
Talked with patient's wife and she is agreeable to placement in [place] as CSW has been unable to find a facility in [place] that will accept patient.   [place] Wife declined this facility for her husband. Contact was made with this facility on 8/4 and they cannot transport patient to dialysis.   [place] made with facility on 8/4 and they are currently not accepting dialysis patients.  3. [facility] - Clinicals faxed to facility on 8/5, to [facility], admissions Mudlogger. Was advised that they have bed availability at this time. 4. [facility] [place]) - Per  admissions director Marcie Bal on 8/7, no male bed and they have an extensive waiting list as they are COVID free. [place] Talked with admissions director Manuela Schwartz on 8/4, and they don't transport out of [place] for dialysis. 6. [facility] - Talked with Myriam Jacobson in admissions 8/7 and they are not taking new admissions right as they have a positive COVID patient. 7. [facility] - Called 8/7 and left message. [facility] - Call made 8/7 and message left. 9. [facility] - Contacted 8/4 and per Wells Guiles, admissions director, no male beds.  Talked with wife regarding [facility] facility search and she was agreeable to a facility in [place] and [facility] was discussed. Contact made with [name], admissions director at [facility] and he indicated that if they can accept patient clinically, dialysis must be set-up in [place]. After reviewing clinicals, [facility] did make a bed offer and dialysis coordinator Jaclyn Shaggy [phone]) was advised so that she can make contact with Javier Docker for patient to dialyze at this facility while in rehab.  Contact made with [facility] as they manage patient's [facility] PPO Medicare. Clinicals faxed to [facility] and [place] given fax [phone] to fax clinicals.  Initiated [facility].      Jarome Trull Givens, MSW, LCSW Licensed Clinical Social Worker [place] [phone]

## 2019-02-12 NOTE — Progress Notes (Signed)
Patient ID: Cole Nelson, male   DOB: Mar 26, 1956, 63 y.o.   MRN: [id number] [facility] Progress Note   Assessment/ Plan:   1.  Acute on chronic pleural effusion status post VATS: Status post completion of antibiotics with Levaquin and vancomycin, remains afebrile with stable respiratory status.  Plans noted for outpatient follow-up with [facility] thoracic surgery. 2. ESRD: Continue hemodialysis on a TTS schedule via AVF ( normally done as OP in [place] ) with next dialysis due tomorrow; he is euvolemic on exam and does not have any uremic type symptoms or signs.  Plans noted for possible admission to CIR, now SNF- search has been difficult. 3. Anemia: Iron stores are replete and he does not have any overt blood loss.   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 No binder- cont home hectorol 5. 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 6. Hypertension:  Now on 5 drug reg with high reading- UF with HD -  BP never really drops - will try more UF tomorrow   Dialysis Prescription: [place], [place] TTS, Optiflux 180, EDW 77kg, 3hr 2min, BFR 450/DFR 800, 2K/2.5Ca, Epogen 10,000 units TIW, Hectorol 42mcg TIW, ONS, LUA AVF 15G, Standard Sodium, Heparin 2000units load and 700units/hr  Subjective:   HD yest- removed 2500-  BP still up    Objective:   BP (!) 170/79 (BP Location: Right Arm)   Pulse 79   Temp 98.9 F (37.2 C) (Oral)   Resp 18   Ht 5\' 11"  (1.803 m)   Wt 60.5 kg   SpO2 95%   BMI 18.60 kg/m   Physical Exam: Gen: In bedside chair-  No c/o's  CVS: Pulse regular rhythm, normal rate, S1 and S2 normal Resp: Clear to auscultation, no rales/rhonchi Abd: Soft, flat, nontender Ext: Left upper arm AV fistula with positive thrill.  No pedal edema  Labs: BMET Recent Labs  Lab 02/07/19 0634 02/09/19 1442 02/10/19 0509 02/11/19 1333  02/12/19 0425  NA 135 128* 132* 130* 130*  K 4.1 4.5 3.9 3.9 3.7  CL 96* 91* 94* 92* 91*  CO2 24 24 25 25 28   GLUCOSE 115* 122* 166* 131* 95  BUN 21 63* 27* 47* 24*  CREATININE 4.15* 7.74* 4.73* 6.87* 4.34*  CALCIUM 8.9 8.6* 8.8* 8.9 8.8*  PHOS 2.7 5.3*  --  4.0  --    CBC Recent Labs  Lab 02/07/19 0634 02/09/19 1442 02/10/19 0509 02/11/19 1333 02/12/19 0425  WBC 13.2* 14.7* 8.1 4.5 4.7  NEUTROABS 11.2*  --  6.6  --  3.2  HGB 9.3* 8.0* 8.9* 8.9* 9.2*  HCT 29.6* 25.3* 28.5* 28.3* 29.8*  MCV 82.2 81.1 81.7 81.3 81.9  PLT 276 268 299 297 304   Medications:    . amLODipine  5 mg Oral BID  . aspirin  81 mg Oral Daily  . budesonide  0.25 mg Nebulization BID  . carvedilol  25 mg Oral BID  . Chlorhexidine Gluconate Cloth  6 each Topical Q0600  . Chlorhexidine Gluconate Cloth  6 each Topical Q0600  . cloNIDine  0.3 mg Oral BID  . darbepoetin (ARANESP) injection - DIALYSIS  60 mcg Intravenous Q Tue-HD  . doxercalciferol  1 mcg Intravenous Q T,Th,Sa-HD  . feeding supplement (NEPRO CARB STEADY)  237 mL Oral BID BM  . feeding supplement (PRO-STAT SUGAR FREE 64)  30 mL Oral BID  . ferrous sulfate  325  mg Oral Daily  . FLUoxetine  20 mg Oral Daily  . heparin  5,000 Units Subcutaneous Q8H  . hydrALAZINE  100 mg Oral TID  . insulin aspart  0-9 Units Subcutaneous Q4H  . lisinopril  10 mg Oral Daily  . mouth rinse  15 mL Mouth Rinse BID  . montelukast  10 mg Oral Daily  . multivitamin  1 tablet Oral QHS  . nystatin  5 mL Oral QID  . pantoprazole  40 mg Oral Q0600   Maday Guarino A Alajia Schmelzer  02/12/2019, 11:27 AM

## 2019-02-12 NOTE — NC FL2 (Signed)
[place] LEVEL OF CARE SCREENING TOOL     IDENTIFICATION  Patient Name: Cole Nelson Birthdate: 04-08-1956 Sex: male Admission Date (Current Location): 01/28/2019  [place] and [place] Number:  ([place] lives in [place], [place] [id number] - Medicaid Advantage Out of State/BCBS [place] and Address:  [facility] [place], [place] [address] [place], [place] [address]      Provider Number: [id number]  Attending Physician Name and Address:  Tawni Millers,*  Relative Name and Phone Number:  Galvin Mccallie - wife; [phone] and [phone] (mobile)    Current Level of Care: Hospital Recommended Level of Care: [place] Prior Approval Number:    Date Approved/Denied:   PASRR Number: (Submitted for PASRR 8/7 - MUST ID [id number])  Discharge Plan: SNF    Current Diagnoses: Patient Active Problem List   Diagnosis Date Noted  . Nausea and vomiting   . Acute gastric erosion   . Abnormal CT scan, small bowel   . Protein-calorie malnutrition, severe 01/29/2019  . Acute encephalopathy 01/28/2019  . Pressure injury of skin 01/28/2019  . Pleural effusion   . End-stage renal disease on hemodialysis (HCC)     Orientation RESPIRATION BLADDER Height & Weight     Self, Place  Normal Incontinent, External catheter(Catheter placed 7/23) Weight: 133 lb 6.1 oz (60.5 kg) Height:  5\' 11"  (180.3 cm)  BEHAVIORAL SYMPTOMS/MOOD NEUROLOGICAL BOWEL NUTRITION STATUS      Incontinent Diet(Renal with 1200 mL fluid restriction)  AMBULATORY STATUS COMMUNICATION OF NEEDS Skin   Limited Assist Verbally (Stage 2 pressure injury to buttocks with foam dressing)                       Personal Care Assistance Level of Assistance  Bathing, Feeding, Dressing Bathing Assistance: Limited assistance Feeding assistance: Independent(Assistance with set-up) Dressing Assistance: Limited assistance     Functional  Limitations Info  Sight, Hearing, Speech Sight Info: Adequate Hearing Info: Adequate Speech Info: Adequate    SPECIAL CARE FACTORS FREQUENCY  PT (By licensed PT), OT (By licensed OT)     PT Frequency: Evaluated 7/26 at hospital. PT at SNF eval and treat; Minimum of 5 days per week OT Frequency: Evaluated 7/27 at hospital. OT at SNF eval and treat. Minimum of 5 days per week            Contractures Contractures Info: Not present    Additional Factors Info  Code Status, Allergies, Insulin Sliding Scale Code Status Info: Full Allergies Info: Baclofen, Cefepine, Penicillins   Insulin Sliding Scale Info: 0-9 Units every four hours       Current Medications (02/12/2019):  This is the current hospital active medication list Current Facility-Administered Medications  Medication Dose Route Frequency Provider Last Rate Last Dose  . 0.9 %  sodium chloride infusion   Intravenous PRN Alma Friendly, MD 10 mL/hr at 02/01/19 0928 250 mL at 02/01/19 0928  . acetaminophen (TYLENOL) suppository 650 mg  650 mg Rectal Q6H PRN Omar Person, NP      . amLODipine (NORVASC) tablet 5 mg  5 mg Oral BID Alma Friendly, MD   5 mg at 02/12/19 0837  . aspirin chewable tablet 81 mg  81 mg Oral Daily Alma Friendly, MD   81 mg at 02/12/19 [id number]  . budesonide (PULMICORT) nebulizer solution 0.25 mg  0.25 mg Nebulization BID Alma Friendly, MD   0.25 mg at 02/12/19 0810  . carvedilol (  COREG) tablet 25 mg  25 mg Oral BID Alma Friendly, MD   25 mg at 02/12/19 [id number]  . Chlorhexidine Gluconate Cloth 2 % PADS 6 each  6 each Topical Q0600 Corliss Parish, MD   6 each at 02/08/19 1053  . Chlorhexidine Gluconate Cloth 2 % PADS 6 each  6 each Topical Q0600 Corliss Parish, MD      . Derrill Memo ON 02/13/2019] Chlorhexidine Gluconate Cloth 2 % PADS 6 each  6 each Topical Q0600 Corliss Parish, MD      . cloNIDine (CATAPRES) tablet 0.3 mg  0.3 mg Oral BID Alma Friendly, MD    0.3 mg at 02/12/19 0837  . Darbepoetin Alfa (ARANESP) injection 60 mcg  60 mcg Intravenous Q Jayme Cloud, MD   60 mcg at 02/09/19 1625  . doxercalciferol (HECTOROL) injection 1 mcg  1 mcg Intravenous Q T,Th,Sa-HD Corliss Parish, MD   1 mcg at 02/11/19 1620  . feeding supplement (NEPRO CARB STEADY) liquid 237 mL  237 mL Oral BID BM Shelly Coss, MD   237 mL at 02/12/19 0838  . feeding supplement (PRO-STAT SUGAR FREE 64) liquid 30 mL  30 mL Oral BID Collene Gobble, MD   30 mL at 02/12/19 0837  . ferrous sulfate tablet 325 mg  325 mg Oral Daily Alma Friendly, MD   325 mg at 02/12/19 [id number]  . FLUoxetine (PROZAC) capsule 20 mg  20 mg Oral Daily Alma Friendly, MD   20 mg at 02/12/19 [id number]  . fluticasone (FLONASE) 50 MCG/ACT nasal spray 1 spray  1 spray Each Nare PRN Alma Friendly, MD      . heparin injection 5,000 Units  5,000 Units Subcutaneous Q8H Jennelle Human B, NP   5,000 Units at 02/12/19 1250  . hydrALAZINE (APRESOLINE) tablet 100 mg  100 mg Oral TID Alma Friendly, MD   100 mg at 02/12/19 0837  . insulin aspart (novoLOG) injection 0-9 Units  0-9 Units Subcutaneous Q4H Jennelle Human B, NP   1 Units at 02/12/19 1115  . ipratropium-albuterol (DUONEB) 0.5-2.5 (3) MG/3ML nebulizer solution 3 mL  3 mL Nebulization Q4H PRN Alma Friendly, MD      . labetalol (NORMODYNE) injection 10 mg  10 mg Intravenous Q2H PRN Alma Friendly, MD   10 mg at 01/30/19 2228  . lisinopril (ZESTRIL) tablet 10 mg  10 mg Oral Daily Elmarie Shiley, MD   10 mg at 02/12/19 [phone]  . MEDLINE mouth rinse  15 mL Mouth Rinse BID Aldean Jewett, MD   15 mL at 02/12/19 0838  . montelukast (SINGULAIR) tablet 10 mg  10 mg Oral Daily Alma Friendly, MD   10 mg at 02/12/19 0837  . multivitamin (RENA-VIT) tablet 1 tablet  1 tablet Oral QHS Alma Friendly, MD   1 tablet at 02/11/19 2127  . nystatin (MYCOSTATIN) 100000 UNIT/ML suspension 500,000 Units  5 mL Oral QID Collene Gobble, MD   500,000 Units at 02/12/19 1250  . ondansetron (ZOFRAN) injection 4 mg  4 mg Intravenous Q6H PRN Omar Person, NP      . pantoprazole (PROTONIX) EC tablet 40 mg  40 mg Oral Q0600 Jerene Bears, MD   40 mg at 02/12/19 0547  . promethazine (PHENERGAN) injection 12.5 mg  12.5 mg Intravenous Q6H PRN Omar Person, NP      . traMADol Veatrice Bourbon) tablet 50 mg  50 mg Oral Q12H PRN Bodenheimer,  Clenton Pare, NP   50 mg at 02/05/19 0544     Discharge Medications: Please see discharge summary for a list of discharge medications.  Relevant Imaging Results:  Relevant Lab Results:   Additional Information ss#[id number]. [place] dialysis infor: [facility] [place] [facility] - 6 am chair time. Will be set-up at [facility] - days and chair time pending.  Sable Feil, LCSW

## 2019-02-12 NOTE — Plan of Care (Signed)
  Problem: Education: Goal: Knowledge of General Education information will improve Description: Including pain rating scale, medication(s)/side effects and non-pharmacologic comfort measures Outcome: Progressing   Problem: Activity: Goal: Risk for activity intolerance will decrease Outcome: Progressing   

## 2019-02-12 NOTE — Progress Notes (Signed)
Renal Navigator received call back from [place] stating that they have received referral for OP HD treatment at [facility] and will follow up Monday, 02/15/19.  Alphonzo Cruise, [place] Renal Navigator [phone]

## 2019-02-12 NOTE — Progress Notes (Signed)
Renal Navigator informed by CSW/V. Crawford that patient has been accepted at [facility] for SNF placement and in this case, he will need to receive OP HD at [facility] in [place]. CSW reports that patient and wife are agreeable to this plan and that SNF can accommodate any schedule/shift.  Renal Navigator completed referral to [place] requesting TTS schedule at [facility] clinic in order to keep patient on current HD schedule. Renal Navigator contacted patient's home clinic/[facility] and spoke with Redmond Pulling to notify of plan. Renal Navigator spoke with Joy/[facility] to inform of referral. Joy states she will contact [facility] to determine if patient will be classified as a "transfer" or a "transient" and then determine what records she needs to request from his home clinic. Renal Navigator will follow up on referral on Monday and informed Joy[facility] that best case scenario would be discharge Monday, 02/15/19 with a start in the clinic on Tuesday, 02/16/19.  Alphonzo Cruise, [place] Renal Navigator [phone]

## 2019-02-13 LAB — BASIC METABOLIC PANEL
Anion gap: 11 (ref 5–15)
Anion gap: 13 (ref 5–15)
BUN: 24 mg/dL — ABNORMAL HIGH (ref 8–23)
BUN: 39 mg/dL — ABNORMAL HIGH (ref 8–23)
CO2: 28 mmol/L (ref 22–32)
CO2: 27 mmol/L (ref 22–32)
Calcium: 8.8 mg/dL — ABNORMAL LOW (ref 8.9–10.3)
Calcium: 9.4 mg/dL (ref 8.9–10.3)
Chloride: 91 mmol/L — ABNORMAL LOW (ref 98–111)
Chloride: 91 mmol/L — ABNORMAL LOW (ref 98–111)
Creatinine, Ser: 4.34 mg/dL — ABNORMAL HIGH (ref 0.61–1.24)
Creatinine, Ser: 6.07 mg/dL — ABNORMAL HIGH (ref 0.61–1.24)
GFR calc Af Amer: 16 mL/min — ABNORMAL LOW (ref >60)
GFR calc Af Amer: 11 mL/min — ABNORMAL LOW (ref >60)
GFR calc non Af Amer: 14 mL/min — ABNORMAL LOW (ref >60)
GFR calc non Af Amer: 9 mL/min — ABNORMAL LOW (ref >60)
Glucose, Bld: 95 mg/dL (ref 70–99)
Glucose, Bld: 141 mg/dL — ABNORMAL HIGH (ref 70–99)
Potassium: 3.7 mmol/L (ref 3.5–5.1)
Potassium: 3.9 mmol/L (ref 3.5–5.1)
Sodium: 130 mmol/L — ABNORMAL LOW (ref 135–145)
Sodium: 131 mmol/L — ABNORMAL LOW (ref 135–145)

## 2019-02-13 LAB — GLUCOSE, CAPILLARY
Glucose-Capillary: 113 mg/dL — ABNORMAL HIGH (ref 70–99)
Glucose-Capillary: 126 mg/dL — ABNORMAL HIGH (ref 70–99)
Glucose-Capillary: 136 mg/dL — ABNORMAL HIGH (ref 70–99)
Glucose-Capillary: 137 mg/dL — ABNORMAL HIGH (ref 70–99)
Glucose-Capillary: 171 mg/dL — ABNORMAL HIGH (ref 70–99)
Glucose-Capillary: 68 mg/dL — ABNORMAL LOW (ref 70–99)
Glucose-Capillary: 98 mg/dL (ref 70–99)

## 2019-02-13 LAB — CBC
HCT: 28.3 % — ABNORMAL LOW (ref 39.0–52.0)
Hemoglobin: 8.8 g/dL — ABNORMAL LOW (ref 13.0–17.0)
MCH: 25.3 pg — ABNORMAL LOW (ref 26.0–34.0)
MCHC: 31.1 g/dL (ref 30.0–36.0)
MCV: 81.3 fL (ref 80.0–100.0)
Platelets: 281 10*3/uL (ref 150–400)
RBC: 3.48 MIL/uL — ABNORMAL LOW (ref 4.22–5.81)
RDW: 15 % (ref 11.5–15.5)
WBC: 5.5 10*3/uL (ref 4.0–10.5)
nRBC: 0 % (ref 0.0–0.2)

## 2019-02-13 MED ORDER — GLUCOSE 40 % PO GEL
ORAL | Status: AC
  Filled 2019-02-13: qty 1

## 2019-02-13 MED ORDER — DOXERCALCIFEROL 4 MCG/2ML IV SOLN
INTRAVENOUS | Status: AC
  Administered 2019-02-13: 1 ug via INTRAVENOUS
  Filled 2019-02-13: qty 2

## 2019-02-13 MED ORDER — HEPARIN SODIUM (PORCINE) 1000 UNIT/ML IJ SOLN
INTRAMUSCULAR | Status: AC
  Administered 2019-02-13: 2000 [IU]
  Filled 2019-02-13: qty 2

## 2019-02-13 MED ORDER — GLUCOSE 40 % PO GEL
1.0000 | ORAL | Status: AC
  Administered 2019-02-13: 37.5 g via ORAL

## 2019-02-13 NOTE — Progress Notes (Signed)
Patient ID: Tarig Barrozo, male   DOB: 08-29-1955, 63 y.o.   MRN: [id number] [facility] Progress Note   Assessment/ Plan:   1.  Acute on chronic pleural effusion status post VATS: Status post completion of antibiotics with Levaquin and vancomycin, remains afebrile with stable respiratory status.  Plans noted for outpatient follow-up with [facility] thoracic surgery. 2. ESRD: Continue hemodialysis on a TTS schedule via AVF ( normally done as OP in [place] ) with next dialysis due today; he is euvolemic on exam and does not have any uremic type symptoms or signs.  Plans noted for possible admission to CIR, now SNF- search has been difficult. Has bed at [facility] in [place]- trying to set up transient tx at [facility] 3. Anemia: Iron stores are replete and he does not have any overt blood loss.   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 No binder- cont home hectorol 5. 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 6. Hypertension:  Now on 5 drug reg with high reading- UF with HD -  BP never really drops - will try more UF today   Dialysis Prescription[place], Optiflux 180, EDW 77kg, 3hr 37min, BFR 450/DFR 800, 2K/2.5Ca, Epogen 10,000 units TIW, Hectorol 56mcg TIW, ONS, LUA AVF 15G, Standard Sodium, Heparin 2000units load and 700units/hr  Subjective:   Seen in HD-  No c/o's    Objective:   BP (!) 151/75   Pulse 83   Temp 98.7 F (37.1 C) (Oral)   Resp 16   Ht 5\' 11"  (1.803 m)   Wt 60.9 kg   SpO2 99%   BMI 18.73 kg/m   Physical Exam: Gen: In bed- seen on HD-  No c/o's  CVS: Pulse regular rhythm, normal rate, S1 and S2 normal Resp: Clear to auscultation, no rales/rhonchi Abd: Soft, flat, nontender Ext: Left upper arm AV fistula with positive thrill.  No pedal edema  Labs: BMET Recent Labs  Lab 02/07/19 0634 02/09/19 1442  02/10/19 0509 02/11/19 1333 02/12/19 0425 02/13/19 0427  NA 135 128* 132* 130* 130* 131*  K 4.1 4.5 3.9 3.9 3.7 3.9  CL 96* 91* 94* 92* 91* 91*  CO2 24 24 25 25 28 27   GLUCOSE 115* 122* 166* 131* 95 141*  BUN 21 63* 27* 47* 24* 39*  CREATININE 4.15* 7.74* 4.73* 6.87* 4.34* 6.07*  CALCIUM 8.9 8.6* 8.8* 8.9 8.8* 9.4  PHOS 2.7 5.3*  --  4.0  --   --    CBC Recent Labs  Lab 02/07/19 0634  02/10/19 0509 02/11/19 1333 02/12/19 0425 02/13/19 0717  WBC 13.2*   < > 8.1 4.5 4.7 5.5  NEUTROABS 11.2*  --  6.6  --  3.2  --   HGB 9.3*   < > 8.9* 8.9* 9.2* 8.8*  HCT 29.6*   < > 28.5* 28.3* 29.8* 28.3*  MCV 82.2   < > 81.7 81.3 81.9 81.3  PLT 276   < > 299 297 304 281   < > = values in this interval not displayed.   Medications:    . amLODipine  5 mg Oral BID  . aspirin  81 mg Oral Daily  . budesonide  0.25 mg Nebulization BID  . carvedilol  25 mg Oral BID  . Chlorhexidine Gluconate Cloth  6 each Topical Q0600  . Chlorhexidine Gluconate Cloth  6 each Topical Q0600  .  Chlorhexidine Gluconate Cloth  6 each Topical Q0600  . cloNIDine  0.3 mg Oral BID  . darbepoetin (ARANESP) injection - DIALYSIS  60 mcg Intravenous Q Tue-HD  . doxercalciferol  1 mcg Intravenous Q T,Th,Sa-HD  . feeding supplement (NEPRO CARB STEADY)  237 mL Oral BID BM  . feeding supplement (PRO-STAT SUGAR FREE 64)  30 mL Oral BID  . ferrous sulfate  325 mg Oral Daily  . FLUoxetine  20 mg Oral Daily  . heparin      . heparin  5,000 Units Subcutaneous Q8H  . hydrALAZINE  100 mg Oral TID  . insulin aspart  0-9 Units Subcutaneous Q4H  . lisinopril  10 mg Oral Daily  . mouth rinse  15 mL Mouth Rinse BID  . montelukast  10 mg Oral Daily  . multivitamin  1 tablet Oral QHS  . nystatin  5 mL Oral QID  . pantoprazole  40 mg Oral Q0600   Correll Denbow A Tymeka Privette  02/13/2019, 9:10 AM

## 2019-02-13 NOTE — Procedures (Signed)
Patient was seen on dialysis and the procedure was supervised.  BFR 400  Via AVF BP is  145/75.   Patient appears to be tolerating treatment well  Louis Meckel 02/13/2019

## 2019-02-13 NOTE — Progress Notes (Signed)
Hypoglycemic Event  CBG: 68  Treatment: Dextrose (GLUTOSE) 40% oral gel   Symptoms: None  Follow-up CBG: Time: 2046 CBG Result: 113  Possible Reasons for Event: Patient stated he didn't eat his dinner  Comments/MD notified: Hypoglycemic protocol intiated. Bl[place],NP notified.    Cole Nelson

## 2019-02-13 NOTE — Progress Notes (Signed)
PROGRESS NOTE    Cole Nelson  [id number] DOB: 1956/04/22 DOA: 01/28/2019 PCP: Patient, No Pcp Per    Brief Narrative:  63 year old male who presented to [facility] July 11 with dyspnea, hypoxia and chronic pleural effusion status post left VATS and question positive mass, ongoing nausea and vomiting.  He does have history of cirrhosis on hemodialysis, type 2 diabetes mellitus, GERD, hypertension, diastolic heart failure, multiple sclerosis, history of CVA, chronic left pleural effusion, anemia and depression. Patient had persistent nausea and vomiting, CTA of the abdomen showed gastric distention and dilatation of the second and proximal third portion of the duodenum possible narrowing at the level of the hiatus between the aorta and sputum splenic artery consistent with SMA syndrome.  Underwent further work-up with enteroscopy July 26 which showed gastroparesis with gastric erosions.  For his left pleural effusion patient has received antibiotic therapy, plan to follow-up as an outpatient with thoracic surgery.  Patient currently waiting for placement at skilled nursing facility.   Assessment & Plan:   Active Problems:   Acute encephalopathy   Pressure injury of skin   Pleural effusion   End-stage renal disease on hemodialysis (HCC)   Protein-calorie malnutrition, severe   Nausea and vomiting   Acute gastric erosion   Abnormal CT scan, small bowel   1. Acute on chronic left pleural effusion. Clinically stable, patient is  Oxygenating, plan to follow up as outpatient with [facility] thoracic surgery.   2. Gastroparesis. Marland Kitchen SMA has been ruled out. Patient is tolerating po well, no nausea or vomiting.  3. ESRD on HD. On HD today, tolerating well, continue follow recommendations per nephrology.   4. T2DM. Patient tolerating po well, will continue glucose cover and monitoring.  5. Multiple sclerosis. Clinically stable, continue to be very weak and deconditioned,  will need SNF.   6. Severe calorie protein malnutrition. On nutritional supplements.   7. Right buttock stage 2 pressure ulcer present on admission. Continue with local wound care.   8. HTN. Continue with amlodipine, clonidine, carvedilol, hydralazine and lisinopril for blood pressure control.   9. Iron deficiency anemia. Tolerating well iron supplementation.   DVT prophylaxis: enoxaparin   Code Status: full Family Communication: no family at the bedside  Disposition Plan/ discharge barriers: pending SNF placement   Body mass index is 18.73 kg/m. Malnutrition Type:  Nutrition Problem: Severe Malnutrition Etiology: chronic illness(ESRD on HD, CHF)   Malnutrition Characteristics:  Signs/Symptoms: severe muscle depletion, severe fat depletion   Nutrition Interventions:  Interventions: Boost Breeze, Prostat  RN Pressure Injury Documentation: Pressure Injury 01/28/19 Buttocks Right Stage II -  Partial thickness loss of dermis presenting as a shallow open ulcer with a red, pink wound bed without slough. (Active)  01/28/19 0120  Location: Buttocks  Location Orientation: Right  Staging: Stage II -  Partial thickness loss of dermis presenting as a shallow open ulcer with a red, pink wound bed without slough.  Wound Description (Comments):   Present on Admission: Yes     Consultants:   Nephrology   Procedures:     Antimicrobials:       Subjective: Patient undergoing HD today, with no chest pain, no nausea or vomiting, continue to feel very weak and deconditioned.   Objective: Vitals:   02/13/19 0745 02/13/19 0815 02/13/19 0845 02/13/19 0915  BP: (!) 167/78 (!) 145/74 (!) 151/75 (!) 172/87  Pulse: 81 78 83 88  Resp:      Temp:      TempSrc:  SpO2:      Weight:      Height:        Intake/Output Summary (Last 24 hours) at 02/13/2019 0934 Last data filed at 02/13/2019 [id number] Gross per 24 hour  Intake 240 ml  Output 0 ml  Net 240 ml   Filed Weights     02/11/19 2007 02/12/19 1930 02/13/19 0647  Weight: 60.5 kg 57.2 kg 60.9 kg    Examination:   General: deconditioned  Neurology: Awake and alert, non focal  E ENT: no pallor, no icterus, oral mucosa moist Cardiovascular: No JVD. S1-S2 present, rhythmic, no gallops, rubs, or murmurs. No lower extremity edema. Pulmonary: positive  breath sounds bilaterally, adequate air movement, no wheezing, rhonchi or rales. Gastrointestinal. Abdomen with no organomegaly, non tender, no rebound or guarding Skin. No rashes Musculoskeletal: no joint deformities     Data Reviewed: I have personally reviewed following labs and imaging studies  CBC: Recent Labs  Lab 02/07/19 0634 02/09/19 1442 02/10/19 0509 02/11/19 1333 02/12/19 0425 02/13/19 0717  WBC 13.2* 14.7* 8.1 4.5 4.7 5.5  NEUTROABS 11.2*  --  6.6  --  3.2  --   HGB 9.3* 8.0* 8.9* 8.9* 9.2* 8.8*  HCT 29.6* 25.3* 28.5* 28.3* 29.8* 28.3*  MCV 82.2 81.1 81.7 81.3 81.9 81.3  PLT 276 268 299 297 304 [id number]   Basic Metabolic Panel: Recent Labs  Lab 02/07/19 0634 02/09/19 1442 02/10/19 0509 02/11/19 1333 02/12/19 0425 02/13/19 0427  NA 135 128* 132* 130* 130* 131*  K 4.1 4.5 3.9 3.9 3.7 3.9  CL 96* 91* 94* 92* 91* 91*  CO2 24 24 25 25 28 27   GLUCOSE 115* 122* 166* 131* 95 141*  BUN 21 63* 27* 47* 24* 39*  CREATININE 4.15* 7.74* 4.73* 6.87* 4.34* 6.07*  CALCIUM 8.9 8.6* 8.8* 8.9 8.8* 9.4  PHOS 2.7 5.3*  --  4.0  --   --    GFR: Estimated Creatinine Clearance: 10.9 mL/min (A) (by C-G formula based on SCr of 6.07 mg/dL (H)). Liver Function Tests: Recent Labs  Lab 02/07/19 0634 02/09/19 1442 02/11/19 1333  ALBUMIN 2.5* 2.2* 2.5*   No results for input(s): LIPASE, AMYLASE in the last 168 hours. No results for input(s): AMMONIA in the last 168 hours. Coagulation Profile: No results for input(s): INR, PROTIME in the last 168 hours. Cardiac Enzymes: No results for input(s): CKTOTAL, CKMB, CKMBINDEX, TROPONINI in the last 168  hours. BNP (last 3 results) No results for input(s): PROBNP in the last 8760 hours. HbA1C: No results for input(s): HGBA1C in the last 72 hours. CBG: Recent Labs  Lab 02/12/19 1621 02/12/19 1959 02/13/19 0023 02/13/19 0412 02/13/19 0832  GLUCAP 170* 113* 136* 137* 98   Lipid Profile: No results for input(s): CHOL, HDL, LDLCALC, TRIG, CHOLHDL, LDLDIRECT in the last 72 hours. Thyroid Function Tests: No results for input(s): TSH, T4TOTAL, FREET4, T3FREE, THYROIDAB in the last 72 hours. Anemia Panel: No results for input(s): VITAMINB12, FOLATE, FERRITIN, TIBC, IRON, RETICCTPCT in the last 72 hours.    Radiology Studies: I have reviewed all of the imaging during this hospital visit personally     Scheduled Meds:  amLODipine  5 mg Oral BID   aspirin  81 mg Oral Daily   budesonide  0.25 mg Nebulization BID   carvedilol  25 mg Oral BID   Chlorhexidine Gluconate Cloth  6 each Topical Q0600   Chlorhexidine Gluconate Cloth  6 each Topical Q0600   Chlorhexidine Gluconate Cloth  6  each Topical Q0600   cloNIDine  0.3 mg Oral BID   darbepoetin (ARANESP) injection - DIALYSIS  60 mcg Intravenous Q Tue-HD   doxercalciferol  1 mcg Intravenous Q T,Th,Sa-HD   feeding supplement (NEPRO CARB STEADY)  237 mL Oral BID BM   feeding supplement (PRO-STAT SUGAR FREE 64)  30 mL Oral BID   ferrous sulfate  325 mg Oral Daily   FLUoxetine  20 mg Oral Daily   heparin       heparin  5,000 Units Subcutaneous Q8H   hydrALAZINE  100 mg Oral TID   insulin aspart  0-9 Units Subcutaneous Q4H   lisinopril  10 mg Oral Daily   mouth rinse  15 mL Mouth Rinse BID   montelukast  10 mg Oral Daily   multivitamin  1 tablet Oral QHS   nystatin  5 mL Oral QID   pantoprazole  40 mg Oral Q0600   Continuous Infusions:  sodium chloride 250 mL (02/01/19 0928)     LOS: 16 days        Cole Segers Gerome Apley, MD

## 2019-02-14 LAB — CBC WITH DIFFERENTIAL/PLATELET
Abs Immature Granulocytes: 0.02 10*3/uL (ref 0.00–0.07)
Basophils Absolute: 0.1 10*3/uL (ref 0.0–0.1)
Basophils Relative: 1 %
Eosinophils Absolute: 0.5 10*3/uL (ref 0.0–0.5)
Eosinophils Relative: 7 %
HCT: 29.5 % — ABNORMAL LOW (ref 39.0–52.0)
Hemoglobin: 9.2 g/dL — ABNORMAL LOW (ref 13.0–17.0)
Immature Granulocytes: 0 %
Lymphocytes Relative: 11 %
Lymphs Abs: 0.7 10*3/uL (ref 0.7–4.0)
MCH: 25.4 pg — ABNORMAL LOW (ref 26.0–34.0)
MCHC: 31.2 g/dL (ref 30.0–36.0)
MCV: 81.5 fL (ref 80.0–100.0)
Monocytes Absolute: 0.9 10*3/uL (ref 0.1–1.0)
Monocytes Relative: 14 %
Neutro Abs: 4.2 10*3/uL (ref 1.7–7.7)
Neutrophils Relative %: 67 %
Platelets: 256 10*3/uL (ref 150–400)
RBC: 3.62 MIL/uL — ABNORMAL LOW (ref 4.22–5.81)
RDW: 15.4 % (ref 11.5–15.5)
WBC: 6.3 10*3/uL (ref 4.0–10.5)
nRBC: 0 % (ref 0.0–0.2)

## 2019-02-14 LAB — GLUCOSE, CAPILLARY
Glucose-Capillary: 110 mg/dL — ABNORMAL HIGH (ref 70–99)
Glucose-Capillary: 114 mg/dL — ABNORMAL HIGH (ref 70–99)
Glucose-Capillary: 128 mg/dL — ABNORMAL HIGH (ref 70–99)
Glucose-Capillary: 144 mg/dL — ABNORMAL HIGH (ref 70–99)
Glucose-Capillary: 149 mg/dL — ABNORMAL HIGH (ref 70–99)
Glucose-Capillary: 161 mg/dL — ABNORMAL HIGH (ref 70–99)
Glucose-Capillary: 192 mg/dL — ABNORMAL HIGH (ref 70–99)
Glucose-Capillary: 203 mg/dL — ABNORMAL HIGH (ref 70–99)
Glucose-Capillary: 60 mg/dL — ABNORMAL LOW (ref 70–99)

## 2019-02-14 LAB — BASIC METABOLIC PANEL
Anion gap: 13 (ref 5–15)
BUN: 28 mg/dL — ABNORMAL HIGH (ref 8–23)
CO2: 27 mmol/L (ref 22–32)
Calcium: 9.2 mg/dL (ref 8.9–10.3)
Chloride: 92 mmol/L — ABNORMAL LOW (ref 98–111)
Creatinine, Ser: 4.34 mg/dL — ABNORMAL HIGH (ref 0.61–1.24)
GFR calc Af Amer: 16 mL/min — ABNORMAL LOW (ref >60)
GFR calc non Af Amer: 14 mL/min — ABNORMAL LOW (ref >60)
Glucose, Bld: 79 mg/dL (ref 70–99)
Potassium: 4.2 mmol/L (ref 3.5–5.1)
Sodium: 132 mmol/L — ABNORMAL LOW (ref 135–145)

## 2019-02-14 MED ORDER — GLUCOSE 40 % PO GEL: 1.0000 | Freq: Once | ORAL | Status: AC

## 2019-02-14 MED ORDER — GLUCOSE 40 % PO GEL
ORAL | Status: AC
  Administered 2019-02-14: 37.5 g
  Filled 2019-02-14: qty 1

## 2019-02-14 MED ORDER — ACETAMINOPHEN 325 MG PO TABS: 650.0000 mg | ORAL_TABLET | Freq: Four times a day (QID) | ORAL | Status: DC | PRN

## 2019-02-14 NOTE — Plan of Care (Signed)
  Problem: Education: Goal: Knowledge of General Education information will improve Description Including pain rating scale, medication(s)/side effects and non-pharmacologic comfort measures Outcome: Progressing   

## 2019-02-14 NOTE — Progress Notes (Signed)
PROGRESS NOTE    Cole Nelson  [id number] DOB: March 16, 1956 DOA: 01/28/2019 PCP: Patient, No Pcp Per    Brief Narrative:  63 year old male who presented to [facility] July 11 with dyspnea, hypoxia and chronic pleural effusion status post left VATS and question positive pleural mass, complicated with ongoing nausea and vomiting. He does have history of ESRD on hemodialysis, type 2 diabetes mellitus, GERD, hypertension, diastolic heart failure, multiple sclerosis, history of CVA, chronic left pleural effusion, anemia and depression. 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  He was transferred to [facility] for further evaluation.   Underwent further work-up with enteroscopy July 26 which showed gastroparesis with gastric erosions. SMA syndrome was ruled out. For his left pleural effusion patient has received antibiotic therapy, plan to follow-up as an outpatient with thoracic surgery.  Patient currently waiting for placement at skilled nursing facility due to severe deconditioning and debility.    Assessment & Plan:   Active Problems:   Acute encephalopathy   Pressure injury of skin   Pleural effusion   End-stage renal disease on hemodialysis (HCC)   Protein-calorie malnutrition, severe   Nausea and vomiting   Acute gastric erosion   Abnormal CT scan, small bowel    1. Acute on chronic left pleural effusion/ chronic large left hydropneumothorax. Continue to be clinically stable, no chest pain or dyspnea. Follow up as outpatient with CT surgery at [facility].  2. Gastroparesis. Marland Kitchen SMA has been ruled out. Continue with good po toleration, no nausea or vomiting.   3. ESRD on HD. Patient continue tolerating well HD, clinically today  euvolemic.   4. T2DM. Positive episodic hypoglycemia (new), with glucose down to 60 this am, will hold on insulin therapy for now,. Continue to encourage po intake.   5. Multiple sclerosis. Patient very debilitated, pending transfer to SNF.    6. Severe calorie protein malnutrition. Continue with nutritional supplements.   7. Right buttock stage 2 pressure ulcer present on admission. On local wound care.  8. HTN. Blood pressure control with amlodipine, clonidine, carvedilol, hydralazine and lisinopril.  9. Iron deficiency anemia. On iron supplementation with good toleration.   DVT prophylaxis:enoxaparin Code Status:full Family Communication:no family at the bedside Disposition Plan/ discharge barriers:pending SNF placement    Body mass index is 18.11 kg/m. Malnutrition Type:  Nutrition Problem: Severe Malnutrition Etiology: chronic illness(ESRD on HD, CHF)   Malnutrition Characteristics:  Signs/Symptoms: severe muscle depletion, severe fat depletion   Nutrition Interventions:  Interventions: Boost Breeze, Prostat  RN Pressure Injury Documentation: Pressure Injury 01/28/19 Buttocks Right Stage II -  Partial thickness loss of dermis presenting as a shallow open ulcer with a red, pink wound bed without slough. (Active)  01/28/19 0120  Location: Buttocks  Location Orientation: Right  Staging: Stage II -  Partial thickness loss of dermis presenting as a shallow open ulcer with a red, pink wound bed without slough.  Wound Description (Comments):   Present on Admission: Yes     Consultants:   Nephrology   Surgery   GI   Procedures:   Enteroscopy   Antimicrobials:       Subjective: Patient continue to feel better, but continue to be very weak and deconditioned, tolerating po well with no nausea or vomiting.   Objective: Vitals:   02/13/19  2103 02/14/19 0449 02/14/19 0629 02/14/19 0738  BP:   (!) 161/67   Pulse: 75  70   Resp: 16  16    Temp:   97.9 F (36.6 C)   TempSrc:   Oral   SpO2: 96%  99% 96%  Weight:  58.9 kg    Height:        Intake/Output Summary (Last 24 hours) at 02/14/2019 0820 Last data filed at 02/14/2019 0600 Gross per 24 hour  Intake 200 ml  Output 2394 ml  Net -2194 ml   Filed Weights   02/13/19 0647 02/13/19 1027 02/14/19 0449  Weight: 60.9 kg 58.9 kg 58.9 kg    Examination:   General: Not in pain or dyspnea, deconditioned  Neurology: Awake and alert, non focal  E ENT: mild pallor, no icterus, oral mucosa moist Cardiovascular: No JVD. S1-S2 present, rhythmic, no gallops, rubs, or murmurs. No lower extremity edema. Pulmonary: positive breath sounds bilaterally, adequate air movement, no wheezing, rhonchi or rales. Gastrointestinal. Abdomen with, no organomegaly, non tender, no rebound or guarding Skin. No rashes Musculoskeletal: no joint deformities     Data Reviewed: I have personally reviewed following labs and imaging studies  CBC: Recent Labs  Lab 02/10/19 0509 02/11/19 1333 02/12/19 0425 02/13/19 0717 02/14/19 0534  WBC 8.1 4.5 4.7 5.5 6.3  NEUTROABS 6.6  --  3.2  --  4.2  HGB 8.9* 8.9* 9.2* 8.8* 9.2*  HCT 28.5* 28.3* 29.8* 28.3* 29.5*  MCV 81.7 81.3 81.9 81.3 81.5  PLT 299 297 304 281 [id number]   Basic Metabolic Panel: Recent Labs  Lab 02/09/19 1442 02/10/19 0509 02/11/19 1333 02/12/19 0425 02/13/19 0427 02/14/19 0534  NA 128* 132* 130* 130* 131* 132*  K 4.5 3.9 3.9 3.7 3.9 4.2  CL 91* 94* 92* 91* 91* 92*  CO2 24 25 25 28 27 27   GLUCOSE 122* 166* 131* 95 141* 79  BUN 63* 27* 47* 24* 39* 28*  CREATININE 7.74* 4.73* 6.87* 4.34* 6.07* 4.34*  CALCIUM 8.6* 8.8* 8.9 8.8* 9.4 9.2  PHOS 5.3*  --  4.0  --   --   --    GFR: Estimated Creatinine Clearance: 14.7 mL/min (A) (by C-G formula based on SCr of 4.34 mg/dL (H)). Liver Function Tests: Recent Labs  Lab 02/09/19 1442 02/11/19 1333  ALBUMIN 2.2* 2.5*   No results for input(s): LIPASE, AMYLASE in the last 168  hours. No results for input(s): AMMONIA in the last 168 hours. Coagulation Profile: No results for input(s): INR, PROTIME in the last 168 hours. Cardiac Enzymes: No results for input(s): CKTOTAL, CKMB, CKMBINDEX, TROPONINI in the last 168 hours. BNP (last 3 results) No results for input(s): PROBNP in the last 8760 hours. HbA1C: No results for input(s): HGBA1C in the last 72 hours. CBG: Recent Labs  Lab 02/13/19 2046 02/14/19 0009 02/14/19 0033 02/14/19 0420 02/14/19 0715  GLUCAP 113* 149* 144* 161* 60*   Lipid Profile: No results for input(s): CHOL, HDL, LDLCALC, TRIG, CHOLHDL, LDLDIRECT in the last 72 hours. Thyroid Function Tests: No results for input(s): TSH, T4TOTAL, FREET4, T3FREE, THYROIDAB in the last 72 hours. Anemia Panel: No results for input(s): VITAMINB12, FOLATE, FERRITIN, TIBC, IRON, RETICCTPCT in the last 72 hours.    Radiology Studies: I have reviewed all of the imaging during this hospital visit personally     Scheduled Meds: . amLODipine  5 mg Oral BID  . aspirin  81 mg Oral Daily  . budesonide  0.25 mg Nebulization BID  .  carvedilol  25 mg Oral BID  . Chlorhexidine Gluconate Cloth  6 each Topical Q0600  . Chlorhexidine Gluconate Cloth  6 each Topical Q0600  . Chlorhexidine Gluconate Cloth  6 each Topical Q0600  . cloNIDine  0.3 mg Oral BID  . darbepoetin (ARANESP) injection - DIALYSIS  60 mcg Intravenous Q Tue-HD  . dextrose  1 Tube Oral Once  . doxercalciferol  1 mcg Intravenous Q T,Th,Sa-HD  . feeding supplement (NEPRO CARB STEADY)  237 mL Oral BID BM  . feeding supplement (PRO-STAT SUGAR FREE 64)  30 mL Oral BID  . ferrous sulfate  325 mg Oral Daily  . FLUoxetine  20 mg Oral Daily  . heparin  5,000 Units Subcutaneous Q8H  . hydrALAZINE  100 mg Oral TID  . insulin aspart  0-9 Units Subcutaneous Q4H  . lisinopril  10 mg Oral Daily  . mouth rinse  15 mL Mouth Rinse BID  . montelukast  10 mg Oral Daily  . multivitamin  1 tablet Oral QHS  .  nystatin  5 mL Oral QID  . pantoprazole  40 mg Oral Q0600   Continuous Infusions: . sodium chloride 250 mL (02/01/19 0928)     LOS: 17 days        Orli Degrave Gerome Apley, MD

## 2019-02-14 NOTE — Progress Notes (Signed)
Patient ID: Cole Nelson, male   DOB: 1956-01-12, 63 y.o.   MRN: [id number] [facility] Progress Note   Assessment/ Plan:   1.  Acute on chronic pleural effusion status post VATS: Status post completion of antibiotics with Levaquin and vancomycin, remains afebrile with stable respiratory status.  Plans noted for outpatient follow-up with [facility] thoracic surgery. 2. ESRD: Continue hemodialysis on a TTS schedule via AVF ( normally done as OP in [place] ).  Plans noted for possible admission to SNF- search has been difficult. Has bed at [facility] in [place]- trying to set up transient tx at [facility]- ongoing.  Next tx will be due on Tuesday  3. Anemia: Iron stores are replete and he does not have any overt blood loss.   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 No binder- cont home hectorol 5. 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 6. Hypertension:  Now on 5 drug reg with high reading- UF with HD -  BP never really drops - try more UF - BP good for the moment but now low   Dialysis Pres[facility] [place], Optiflux 180, EDW 77kg, 3hr 48min, BFR 450/DFR 800, 2K/2.5Ca, Epogen 10,000 units TIW, Hectorol 21mcg TIW, ONS, LUA AVF 15G, Standard Sodium, Heparin 2000units load and 700units/hr  Subjective:    HD yest- removed 2400 - tolerated well- had one BP in the 130's !   Objective:   BP (!) 161/67 (BP Location: Right Arm)   Pulse 70   Temp 97.9 F (36.6 C) (Oral)   Resp 16   Ht 5\' 11"  (1.803 m)   Wt 58.9 kg   SpO2 96%   BMI 18.11 kg/m   Physical Exam: Gen: In bed- seen on HD-  No c/o's  CVS: Pulse regular rhythm, normal rate, S1 and S2 normal Resp: Clear to auscultation, no rales/rhonchi Abd: Soft, flat, nontender Ext: Left upper arm AV fistula with positive thrill.  No pedal edema  Labs: BMET Recent Labs  Lab  02/09/19 1442 02/10/19 0509 02/11/19 1333 02/12/19 0425 02/13/19 0427 02/14/19 0534  NA 128* 132* 130* 130* 131* 132*  K 4.5 3.9 3.9 3.7 3.9 4.2  CL 91* 94* 92* 91* 91* 92*  CO2 24 25 25 28 27 27   GLUCOSE 122* 166* 131* 95 141* 79  BUN 63* 27* 47* 24* 39* 28*  CREATININE 7.74* 4.73* 6.87* 4.34* 6.07* 4.34*  CALCIUM 8.6* 8.8* 8.9 8.8* 9.4 9.2  PHOS 5.3*  --  4.0  --   --   --    CBC Recent Labs  Lab 02/10/19 0509 02/11/19 1333 02/12/19 0425 02/13/19 0717 02/14/19 0534  WBC 8.1 4.5 4.7 5.5 6.3  NEUTROABS 6.6  --  3.2  --  4.2  HGB 8.9* 8.9* 9.2* 8.8* 9.2*  HCT 28.5* 28.3* 29.8* 28.3* 29.5*  MCV 81.7 81.3 81.9 81.3 81.5  PLT 299 297 304 281 256   Medications:    . amLODipine  5 mg Oral BID  . aspirin  81 mg Oral Daily  . budesonide  0.25 mg Nebulization BID  . carvedilol  25 mg Oral BID  . Chlorhexidine Gluconate Cloth  6 each Topical Q0600  . Chlorhexidine Gluconate Cloth  6 each Topical Q0600  . Chlorhexidine Gluconate Cloth  6 each Topical Q0600  . cloNIDine  0.3 mg Oral BID  . darbepoetin (ARANESP) injection - DIALYSIS  60 mcg  Intravenous Q Tue-HD  . dextrose  1 Tube Oral Once  . doxercalciferol  1 mcg Intravenous Q T,Th,Sa-HD  . feeding supplement (NEPRO CARB STEADY)  237 mL Oral BID BM  . feeding supplement (PRO-STAT SUGAR FREE 64)  30 mL Oral BID  . ferrous sulfate  325 mg Oral Daily  . FLUoxetine  20 mg Oral Daily  . heparin  5,000 Units Subcutaneous Q8H  . hydrALAZINE  100 mg Oral TID  . insulin aspart  0-9 Units Subcutaneous Q4H  . lisinopril  10 mg Oral Daily  . mouth rinse  15 mL Mouth Rinse BID  . montelukast  10 mg Oral Daily  . multivitamin  1 tablet Oral QHS  . nystatin  5 mL Oral QID  . pantoprazole  40 mg Oral Q0600   Louis Meckel  02/14/2019, 9:53 AM

## 2019-02-15 LAB — BASIC METABOLIC PANEL
Anion gap: 13 (ref 5–15)
BUN: 47 mg/dL — ABNORMAL HIGH (ref 8–23)
CO2: 25 mmol/L (ref 22–32)
Calcium: 9.2 mg/dL (ref 8.9–10.3)
Chloride: 91 mmol/L — ABNORMAL LOW (ref 98–111)
Creatinine, Ser: 6.37 mg/dL — ABNORMAL HIGH (ref 0.61–1.24)
GFR calc Af Amer: 10 mL/min — ABNORMAL LOW (ref >60)
GFR calc non Af Amer: 9 mL/min — ABNORMAL LOW (ref >60)
Glucose, Bld: 126 mg/dL — ABNORMAL HIGH (ref 70–99)
Potassium: 4.6 mmol/L (ref 3.5–5.1)
Sodium: 129 mmol/L — ABNORMAL LOW (ref 135–145)

## 2019-02-15 LAB — GLUCOSE, CAPILLARY
Glucose-Capillary: 131 mg/dL — ABNORMAL HIGH (ref 70–99)
Glucose-Capillary: 124 mg/dL — ABNORMAL HIGH (ref 70–99)
Glucose-Capillary: 147 mg/dL — ABNORMAL HIGH (ref 70–99)
Glucose-Capillary: 137 mg/dL — ABNORMAL HIGH (ref 70–99)
Glucose-Capillary: 203 mg/dL — ABNORMAL HIGH (ref 70–99)

## 2019-02-15 NOTE — Care Management Important Message (Signed)
Important Message  Patient Details  Name: Cole Nelson MRN: [id number] Date of Birth: March 10, 1956   Medicare Important Message Given:  Yes     Davien Malone 02/15/2019, 4:00 PM

## 2019-02-15 NOTE — Progress Notes (Signed)
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  He was initially admitted to [facility] and was transferred to [facility] for further evaluation.  GI was consulted and underwent enteroscopy on 7/26 which showed gastroparesis with gastric erosions, SMA was ruled out.  For his left pleural effusion patient has received antibiotic therapy, and the plan is for outpatient follow-up with thoracic surgery at [facility].  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  He was given Reglan with clinical improvement, discontinued on 8/5 and has remained clinically stable.  He is able to tolerate a regular diet.  Active Problems Left-sided pleural effusion, recurrent -Seen by cardiothoracic surgery at [facility], status post left VATS evacuation of fluid on 01/18/2019  -Respiratory status stable, continue outpatient follow-up with cardiothoracic surgery at [facility]  ESRD on HD -Clinically euvolemic, nephrology consulted  Type 2 diabetes mellitus -Had an episode of hypoglycemia on 8/9, sliding scale has been discontinued.  Fasting glucose 124 this morning, continue to monitor  MS -SNF placement pending, he is quite debilitated.  Discussed with social worker, but potentially available on Wednesday.  COVID screening test ordered today  Severe protein calorie malnutrition -Continue nutritional supplements  Right buttock stage II pressure ulcer, POA -Local wound care  Hypertension -Continue medications as below, blood pressure stable  Iron deficiency anemia -Continue supplements   Scheduled Meds: . amLODipine  5 mg Oral BID  . aspirin  81 mg Oral Daily  . budesonide  0.25 mg Nebulization BID  . carvedilol  25 mg Oral BID  . Chlorhexidine Gluconate Cloth  6 each Topical Q0600  . Chlorhexidine Gluconate Cloth  6 each Topical Q0600  . Chlorhexidine Gluconate Cloth  6 each Topical Q0600  . cloNIDine  0.3 mg Oral BID  . darbepoetin (ARANESP) injection - DIALYSIS  60 mcg Intravenous Q Tue-HD  . doxercalciferol  1 mcg Intravenous Q T,Th,Sa-HD  . feeding supplement (NEPRO CARB STEADY)  237 mL Oral BID BM  . feeding supplement (PRO-STAT SUGAR FREE 64)  30 mL Oral BID  . ferrous sulfate  325 mg Oral Daily  . FLUoxetine  20 mg Oral Daily  . heparin  5,000 Units Subcutaneous Q8H  . hydrALAZINE  100 mg Oral TID  . lisinopril  10 mg Oral Daily  . mouth rinse  15 mL Mouth Rinse BID  . montelukast  10 mg Oral Daily  .  multivitamin  1 tablet Oral QHS  . nystatin  5 mL Oral QID  . pantoprazole  40 mg Oral Q0600   Continuous Infusions: . sodium chloride 250 mL (02/01/19 0928)   PRN Meds:.sodium chloride, acetaminophen, fluticasone, ipratropium-albuterol, labetalol, ondansetron (ZOFRAN) IV, traMADol  DVT prophylaxis: heparin Code Status: Full code Family  Communication: d/w patient  Disposition Plan: SNF once repeat Covid is negative and patient has a bed, tentatively Wednesday per social worker  Consultants:   Nephrology  General surgery  Gastroenterology  Vascular surgery  Procedures:   EGD 7/26 Impression:               - Normal esophagus.                           - A small amount of food (residue) in the stomach.                            Consistent with gastroparesis.                           - Scar in the gastric body (likely from healed                            ulcer) with scattered gastric erosions. Biopsied.                           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 Biopsied.                           - No evidence of inflammation, stricture, narrowing                            or obstruction in the examined duodenum and                            proximal jejunum.  Antimicrobials:  None    Objective: Vitals:   02/15/19 0356 02/15/19 0500 02/15/19 0850 02/15/19 0916  BP: 136/61   (!) 151/72  Pulse: 73   72  Resp: 18   18  Temp: 99.2 F (37.3 C)   98.1 F (36.7 C)  TempSrc: Oral   Oral  SpO2: 98%  99% 100%  Weight:  58.9 kg    Height:        Intake/Output Summary (Last 24 hours) at 02/15/2019 1005 Last data filed at 02/15/2019 0800 Gross per 24 hour  Intake 690 ml  Output 0 ml  Net 690 ml   Filed Weights   02/13/19 1027 02/14/19 0449 02/15/19 0500  Weight:  58.9 kg 58.9 kg 58.9 kg    Examination:  Constitutional: NAD Eyes: PERRL, lids and conjunctivae normal Neck: normal, supple, no masses, no thyromegaly Respiratory: clear to auscultation bilaterally, no wheezing, no crackles. Normal respiratory effort.  Cardiovascular: Regular rate and rhythm, no murmurs / rubs / gallops. No LE  edema. 2+ pedal pulses. Abdomen: no tenderness. Bowel sounds positive.  Musculoskeletal: no clubbing / cyanosis.  Skin: no rashes Neurologic: equal strength Psychiatric: Normal judgment and insight. Alert and oriented x 3. Normal mood.    Data Reviewed: I have independently reviewed following labs and imaging studies   CBC: Recent Labs  Lab 02/10/19 0509 02/11/19 1333 02/12/19 0425 02/13/19 0717 02/14/19 0534  WBC 8.1 4.5 4.7 5.5 6.3  NEUTROABS 6.6  --  3.2  --  4.2  HGB 8.9* 8.9* 9.2* 8.8* 9.2*  HCT 28.5* 28.3* 29.8* 28.3* 29.5*  MCV 81.7 81.3 81.9 81.3 81.5  PLT 299 297 304 281 [id number]   Basic Metabolic Panel: Recent Labs  Lab 02/09/19 1442  02/11/19 1333 02/12/19 0425 02/13/19 0427 02/14/19 0534 02/15/19 0343  NA 128*   < > 130* 130* 131* 132* 129*  K 4.5   < > 3.9 3.7 3.9 4.2 4.6  CL 91*   < > 92* 91* 91* 92* 91*  CO2 24   < > 25 28 27 27 25   GLUCOSE 122*   < > 131* 95 141* 79 126*  BUN 63*   < > 47* 24* 39* 28* 47*  CREATININE 7.74*   < > 6.87* 4.34* 6.07* 4.34* 6.37*  CALCIUM 8.6*   < > 8.9 8.8* 9.4 9.2 9.2  PHOS 5.3*  --  4.0  --   --   --   --    < > = values in this interval not displayed.   GFR: Estimated Creatinine Clearance: 10 mL/min (A) (by C-G formula based on SCr of 6.37 mg/dL (H)). Liver Function Tests: Recent Labs  Lab 02/09/19 1442 02/11/19 1333  ALBUMIN 2.2* 2.5*   No results for input(s): LIPASE, AMYLASE in the last 168 hours. No results for input(s): AMMONIA in the last 168 hours. Coagulation Profile: No results for input(s): INR, PROTIME in the last 168 hours. Cardiac Enzymes: No results for input(s): CKTOTAL, CKMB, CKMBINDEX, TROPONINI in the last 168 hours. BNP (last 3 results) No results for input(s): PROBNP in the last 8760 hours. HbA1C: No results for input(s): HGBA1C in the last 72 hours. CBG: Recent Labs  Lab 02/14/19 1703 02/14/19 1949 02/14/19 2354 02/15/19 0354 02/15/19 0727  GLUCAP 110* 128* 192* 131* 124*   Lipid  Profile: No results for input(s): CHOL, HDL, LDLCALC, TRIG, CHOLHDL, LDLDIRECT in the last 72 hours. Thyroid Function Tests: No results for input(s): TSH, T4TOTAL, FREET4, T3FREE, THYROIDAB in the last 72 hours. Anemia Panel: No results for input(s): VITAMINB12, FOLATE, FERRITIN, TIBC, IRON, RETICCTPCT in the last 72 hours. Urine analysis: No results found for: COLORURINE, APPEARANCEUR, LABSPEC, PHURINE, GLUCOSEU, HGBUR, BILIRUBINUR, KETONESUR, PROTEINUR, UROBILINOGEN, NITRITE, LEUKOCYTESUR Sepsis Labs: Invalid input(s): PROCALCITONIN, LACTICIDVEN  No results found for this or any previous visit (from the past 240 hour(s)).    Radiology Studies: No results found.   Marzetta Board, MD, PhD [facility]  Contact via  [email or web address]  [place] P: [phone] F: [phone]

## 2019-02-15 NOTE — NC FL2 (Signed)
[place] LEVEL OF CARE SCREENING TOOL     IDENTIFICATION  Patient Name: Cole Nelson Birthdate: 25-Jan-1956 Sex: male Admission Date (Current Location): 01/28/2019  [place] and [place] Number:  (Patient lives in [place], [place] [id number] - Medicaid Advantage Out of State/BCBS [place] and Address:  [facility] [place], [place] [address] [place],  [address]      Provider Number: [id number]  Attending Physician Name and Address:  Caren Griffins, MD  Relative Name and Phone Number:  [name] Baldassari - wife; [phone] and [phone] (mobile)    Current Level of Care: Hospital Recommended Level of Care: [place] Prior Approval Number:    Date Approved/Denied:   PASRR Number: [id number]  Discharge Plan: SNF    Current Diagnoses: Patient Active Problem List   Diagnosis Date Noted  . Nausea and vomiting   . Acute gastric erosion   . Abnormal CT scan, small bowel   . Protein-calorie malnutrition, severe 01/29/2019  . Acute encephalopathy 01/28/2019  . Pressure injury of skin 01/28/2019  . Pleural effusion   . End-stage renal disease on hemodialysis (HCC)     Orientation RESPIRATION BLADDER Height & Weight     Self, Place  Normal Incontinent, External catheter(Catheter placed 7/23) Weight: 129 lb 13.6 oz (58.9 kg) Height:  5\' 11"  (180.3 cm)  BEHAVIORAL SYMPTOMS/MOOD NEUROLOGICAL BOWEL NUTRITION STATUS      Incontinent Diet(Renal with 1200 mL fluid restriction)  AMBULATORY STATUS COMMUNICATION OF NEEDS Skin   Limited Assist Verbally (Stage 2 pressure injury to buttocks with foam dressing)                       Personal Care Assistance Level of Assistance  Bathing, Feeding, Dressing Bathing Assistance: Limited assistance Feeding assistance: Independent(Assistance with set-up) Dressing Assistance: Limited assistance     Functional Limitations Info  Sight, Hearing, Speech  Sight Info: Adequate Hearing Info: Adequate Speech Info: Adequate    SPECIAL CARE FACTORS FREQUENCY  PT (By licensed PT), OT (By licensed OT)     PT Frequency: Evaluated 7/26 at hospital. PT at SNF eval and treat; Minimum of 5 days per week OT Frequency: Evaluated 7/27 at hospital. OT at SNF eval and treat. Minimum of 5 days per week            Contractures Contractures Info: Not present    Additional Factors Info  Code Status, Allergies, Insulin Sliding Scale Code Status Info: Full Allergies Info: Baclofen, Cefepine, Penicillins   Insulin Sliding Scale Info: 0-9 Units every four hours       Current Medications (02/15/2019):  This is the current hospital active medication list Current Facility-Administered Medications  Medication Dose Route Frequency Provider Last Rate Last Dose  . 0.9 %  sodium chloride infusion   Intravenous PRN Alma Friendly, MD 10 mL/hr at 02/01/19 0928 250 mL at 02/01/19 0928  . acetaminophen (TYLENOL) tablet 650 mg  650 mg Oral Q6H PRN Arrien, Jimmy Picket, MD      . amLODipine (NORVASC) tablet 5 mg  5 mg Oral BID Alma Friendly, MD   5 mg at 02/15/19 1000  . aspirin chewable tablet 81 mg  81 mg Oral Daily Alma Friendly, MD   81 mg at 02/15/19 1000  . budesonide (PULMICORT) nebulizer solution 0.25 mg  0.25 mg Nebulization BID Alma Friendly, MD   0.25 mg at 02/15/19 0849  . carvedilol (COREG) tablet 25 mg  25  mg Oral BID Alma Friendly, MD   25 mg at 02/15/19 1000  . Chlorhexidine Gluconate Cloth 2 % PADS 6 each  6 each Topical Q0600 Corliss Parish, MD   6 each at 02/08/19 1053  . Chlorhexidine Gluconate Cloth 2 % PADS 6 each  6 each Topical Q0600 Corliss Parish, MD      . Chlorhexidine Gluconate Cloth 2 % PADS 6 each  6 each Topical Q0600 Corliss Parish, MD   6 each at 02/13/19 0617  . cloNIDine (CATAPRES) tablet 0.3 mg  0.3 mg Oral BID Alma Friendly, MD   0.3 mg at 02/15/19 1000  . Darbepoetin  Alfa (ARANESP) injection 60 mcg  60 mcg Intravenous Q Jayme Cloud, MD   60 mcg at 02/09/19 1625  . doxercalciferol (HECTOROL) injection 1 mcg  1 mcg Intravenous Q T,Th,Sa-HD Corliss Parish, MD   1 mcg at 02/13/19 1101  . feeding supplement (NEPRO CARB STEADY) liquid 237 mL  237 mL Oral BID BM Adhikari, Amrit, MD   237 mL at 02/15/19 1003  . feeding supplement (PRO-STAT SUGAR FREE 64) liquid 30 mL  30 mL Oral BID Collene Gobble, MD   30 mL at 02/15/19 1000  . ferrous sulfate tablet 325 mg  325 mg Oral Daily Alma Friendly, MD   325 mg at 02/15/19 1000  . FLUoxetine (PROZAC) capsule 20 mg  20 mg Oral Daily Alma Friendly, MD   20 mg at 02/15/19 1000  . fluticasone (FLONASE) 50 MCG/ACT nasal spray 1 spray  1 spray Each Nare PRN Alma Friendly, MD      . heparin injection 5,000 Units  5,000 Units Subcutaneous Q8H Jennelle Human B, NP   5,000 Units at 02/15/19 1343  . hydrALAZINE (APRESOLINE) tablet 100 mg  100 mg Oral TID Alma Friendly, MD   100 mg at 02/15/19 1738  . ipratropium-albuterol (DUONEB) 0.5-2.5 (3) MG/3ML nebulizer solution 3 mL  3 mL Nebulization Q4H PRN Alma Friendly, MD      . labetalol (NORMODYNE) injection 10 mg  10 mg Intravenous Q2H PRN Alma Friendly, MD   10 mg at 01/30/19 2228  . lisinopril (ZESTRIL) tablet 10 mg  10 mg Oral Daily Elmarie Shiley, MD   10 mg at 02/15/19 1000  . MEDLINE mouth rinse  15 mL Mouth Rinse BID Aldean Jewett, MD   15 mL at 02/15/19 1003  . montelukast (SINGULAIR) tablet 10 mg  10 mg Oral Daily Alma Friendly, MD   10 mg at 02/15/19 1000  . multivitamin (RENA-VIT) tablet 1 tablet  1 tablet Oral QHS Alma Friendly, MD   1 tablet at 02/14/19 2258  . nystatin (MYCOSTATIN) 100000 UNIT/ML suspension 500,000 Units  5 mL Oral QID Collene Gobble, MD   500,000 Units at 02/15/19 1738  . ondansetron (ZOFRAN) injection 4 mg  4 mg Intravenous Q6H PRN Omar Person, NP      . pantoprazole (PROTONIX) EC  tablet 40 mg  40 mg Oral Q0600 Jerene Bears, MD   40 mg at 02/15/19 0554  . traMADol (ULTRAM) tablet 50 mg  50 mg Oral Q12H PRN Vertis Kelch, NP   50 mg at 02/05/19 0544     Discharge Medications: Please see discharge summary for a list of discharge medications.  Relevant Imaging Results:  Relevant Lab Results:   Additional Information ss#[id number]. [place] dialysis infor: [facility] - 6 am chair time. Will  be set-up at [facility] - days and chair time pending.  Benard Halsted, LCSW

## 2019-02-15 NOTE — Progress Notes (Signed)
Patient ID: Cole Nelson, male   DOB: March 01, 1956, 63 y.o.   MRN: [id number] S: No new complaints O:BP (!) 151/72 (BP Location: Right Arm)   Pulse 72   Temp 98.1 F (36.7 C) (Oral)   Resp 18   Ht 5\' 11"  (1.803 m)   Wt 58.9 kg   SpO2 100%   BMI 18.11 kg/m   Intake/Output Summary (Last 24 hours) at 02/15/2019 1229 Last data filed at 02/15/2019 0800 Gross per 24 hour  Intake 690 ml  Output 0 ml  Net 690 ml   Intake/Output: I/O last 3 completed shifts: In: 640 [P.O.:640] Out: 0   Intake/Output this shift:  Total I/O In: 300 [P.O.:300] Out: -  Weight change: 0 kg Gen: NAD CVS: no rub Resp:cta Abd: benign Ext: no edema, LUE AVF +T/B  Recent Labs  Lab 02/09/19 1442 02/10/19 0509 02/11/19 1333 02/12/19 0425 02/13/19 0427 02/14/19 0534 02/15/19 0343  NA 128* 132* 130* 130* 131* 132* 129*  K 4.5 3.9 3.9 3.7 3.9 4.2 4.6  CL 91* 94* 92* 91* 91* 92* 91*  CO2 24 25 25 28 27 27 25   GLUCOSE 122* 166* 131* 95 141* 79 126*  BUN 63* 27* 47* 24* 39* 28* 47*  CREATININE 7.74* 4.73* 6.87* 4.34* 6.07* 4.34* 6.37*  ALBUMIN 2.2*  --  2.5*  --   --   --   --   CALCIUM 8.6* 8.8* 8.9 8.8* 9.4 9.2 9.2  PHOS 5.3*  --  4.0  --   --   --   --    Liver Function Tests: Recent Labs  Lab 02/09/19 1442 02/11/19 1333  ALBUMIN 2.2* 2.5*   No results for input(s): LIPASE, AMYLASE in the last 168 hours. No results for input(s): AMMONIA in the last 168 hours. CBC: Recent Labs  Lab 02/10/19 0509 02/11/19 1333 02/12/19 0425 02/13/19 0717 02/14/19 0534  WBC 8.1 4.5 4.7 5.5 6.3  NEUTROABS 6.6  --  3.2  --  4.2  HGB 8.9* 8.9* 9.2* 8.8* 9.2*  HCT 28.5* 28.3* 29.8* 28.3* 29.5*  MCV 81.7 81.3 81.9 81.3 81.5  PLT 299 297 304 281 256   Cardiac Enzymes: No results for input(s): CKTOTAL, CKMB, CKMBINDEX, TROPONINI in the last 168 hours. CBG: Recent Labs  Lab 02/14/19 1949 02/14/19 2354 02/15/19 0354 02/15/19 0727 02/15/19 1123  GLUCAP 128* 192* 131* 124* 147*    Iron Studies: No  results for input(s): IRON, TIBC, TRANSFERRIN, FERRITIN in the last 72 hours. Studies/Results: No results found. Marland Kitchen amLODipine  5 mg Oral BID  . aspirin  81 mg Oral Daily  . budesonide  0.25 mg Nebulization BID  . carvedilol  25 mg Oral BID  . Chlorhexidine Gluconate Cloth  6 each Topical Q0600  . Chlorhexidine Gluconate Cloth  6 each Topical Q0600  . Chlorhexidine Gluconate Cloth  6 each Topical Q0600  . cloNIDine  0.3 mg Oral BID  . darbepoetin (ARANESP) injection - DIALYSIS  60 mcg Intravenous Q Tue-HD  . doxercalciferol  1 mcg Intravenous Q T,Th,Sa-HD  . feeding supplement (NEPRO CARB STEADY)  237 mL Oral BID BM  . feeding supplement (PRO-STAT SUGAR FREE 64)  30 mL Oral BID  . ferrous sulfate  325 mg Oral Daily  . FLUoxetine  20 mg Oral Daily  . heparin  5,000 Units Subcutaneous Q8H  . hydrALAZINE  100 mg Oral TID  . lisinopril  10 mg Oral Daily  . mouth rinse  15 mL Mouth Rinse BID  .  montelukast  10 mg Oral Daily  . multivitamin  1 tablet Oral QHS  . nystatin  5 mL Oral QID  . pantoprazole  40 mg Oral Q0600    BMET    Component Value Date/Time   NA 129 (L) 02/15/2019 0343   K 4.6 02/15/2019 0343   CL 91 (L) 02/15/2019 0343   CO2 25 02/15/2019 0343   GLUCOSE 126 (H) 02/15/2019 0343   BUN 47 (H) 02/15/2019 0343   CREATININE 6.37 (H) 02/15/2019 0343   CALCIUM 9.2 02/15/2019 0343   GFRNONAA 9 (L) 02/15/2019 0343   GFRAA 10 (L) 02/15/2019 0343   CBC    Component Value Date/Time   WBC 6.3 02/14/2019 0534   RBC 3.62 (L) 02/14/2019 0534   HGB 9.2 (L) 02/14/2019 0534   HCT 29.5 (L) 02/14/2019 0534   PLT 256 02/14/2019 0534   MCV 81.5 02/14/2019 0534   MCH 25.4 (L) 02/14/2019 0534   MCHC 31.2 02/14/2019 0534   RDW 15.4 02/14/2019 0534   LYMPHSABS 0.7 02/14/2019 0534   MONOABS 0.9 02/14/2019 0534   EOSABS 0.5 02/14/2019 0534   BASOSABS 0.1 02/14/2019 0534   Dialysis Pres[facility] [place], Optiflux 180, EDW 77kg, 3hr 27min,BFR 450/DFR 800,2K/2.5Ca,  Epogen 10,000 units TIW, Hectorol 43mcg TIW, ONS, LUA AVF 15G, Standard Sodium, Heparin 2000units load and 700units/hr  Assessment/Plan:  1. ESRD- continue on TTS schedule via AVF.  Continue with his regular schedule 2. Acute on chronic pleural effusions s/p VATS- completed abx with levaquin and vancomycin.  [facility] need f[facility] with [facility] 3. Anemia- cont with ESA 4. CKD-MB- stable 5. Nutrition- cont with renal diet.  6. HTN- requires 5 meds.  Cont to follow. 7. Disposition- will need SNF placement at [facility] in [place] and transfer to [facility] (normally at [facility]) which has been arranged but no bed available until 02/17/19.  Awaiting MD acceptance at [facility].  Plan for discharge 8/12 and HD 8/13 as an outpatient.   Donetta Potts, MD [employer] [phone]

## 2019-02-15 NOTE — Progress Notes (Addendum)
Physical Therapy Treatment Patient Details Name: Cole Nelson MRN: [id number] DOB: May 29, 1956 Today's Date: 02/15/2019    History of Present Illness 63 year old male with history of ESRD on TTS- iHD via LUE AVF, DM, tobacco abuse, diabetic gastroparesis, GERD, HTN, diastolic HF, multiple sclerosis, CVA w/residual left sided weakness, chronic left pleural effusion, hx cdiff, diabetic neuropathy, anemia, HLD, and depression, was admitted on 7/11 to [facility] with acute shortness of breath and hypertensive urgency.  Found on admit to have complete opacification of left hemithorax. Acute on chronic pleural effusions s/p left VATS, concern for pleural mass vs infectious consolidation    PT Comments    Patient progressing slightly, min to mod A for ambulating out into hallway today, close chair follow with prompt need to sit citing fatigue. Cues for correcting lean and posture during gait, pt with ability to correct intermittent today. Cont to rec SNF     Follow Up Recommendations  SNF     Equipment Recommendations  Rolling walker with 5" wheels;3in1 (PT)    Recommendations for Other Services Rehab consult     Precautions / Restrictions Precautions Precautions: Fall Restrictions Weight Bearing Restrictions: No    Mobility  Bed Mobility Overal bed mobility: Needs Assistance Bed Mobility: Supine to Sit     Supine to sit: Min assist     General bed mobility comments: minA to progress trunk to upright EOB  Transfers Overall transfer level: Needs assistance Equipment used: Rolling walker (2 wheeled) Transfers: Sit to/from Stand Sit to Stand: Mod assist            Ambulation/Gait Ambulation/Gait assistance: Min assist Gait Distance (Feet): 20 Feet Assistive device: Rolling walker (2 wheeled) Gait Pattern/deviations: Step-through pattern;Decreased step length - left;Scissoring;Narrow base of support;Trunk flexed Gait velocity: decreased   General Gait  Details: R lean, cues for posture and picking head up as he looks down towards ground. fatigues quciukily chair follow the need to sit promplty. high fall risk   Stairs             Wheelchair Mobility    Modified Rankin (Stroke Patients Only)       Balance Overall balance assessment: Needs assistance Sitting-balance support: Feet supported Sitting balance-Leahy Scale: Fair   Postural control: Right lateral lean Standing balance support: Bilateral upper extremity supported;During functional activity Standing balance-Leahy Scale: Poor Standing balance comment: reliant on Bil. UE support                             Cognition Arousal/Alertness: Awake/alert Behavior During Therapy: WFL for tasks assessed/performed Overall Cognitive Status: No family/caregiver present to determine baseline cognitive functioning Area of Impairment: Attention;Safety/judgement;Awareness;Problem solving                   Current Attention Level: Sustained     Safety/Judgement: Decreased awareness of safety;Decreased awareness of deficits Awareness: Emergent Problem Solving: Slow processing;Decreased initiation;Difficulty sequencing        Exercises      General Comments        Pertinent Vitals/Pain Pain Assessment: No/denies pain    Home Living                      Prior Function            PT Goals (current goals can now be found in the care plan section) Acute Rehab PT Goals Patient Stated Goal: to walk more PT Goal  Formulation: With patient Time For Goal Achievement: 02/28/19 Potential to Achieve Goals: Good Progress towards PT goals: Progressing toward goals    Frequency    Min 3X/week      PT Plan Current plan remains appropriate    Co-evaluation              AM-PAC PT "6 Clicks" Mobility   Outcome Measure  Help needed turning from your back to your side while in a flat bed without using bedrails?: A Little Help needed  moving from lying on your back to sitting on the side of a flat bed without using bedrails?: A Little Help needed moving to and from a bed to a chair (including a wheelchair)?: A Little Help needed standing up from a chair using your arms (e.g., wheelchair or bedside chair)?: A Lot Help needed to walk in hospital room?: A Lot Help needed climbing 3-5 steps with a railing? : A Lot 6 Click Score: 15    End of Session Equipment Utilized During Treatment: Gait belt Activity Tolerance: Patient tolerated treatment well Patient left: in chair;with call bell/phone within reach;with chair alarm set Nurse Communication: Mobility status PT Visit Diagnosis: Other abnormalities of gait and mobility (R26.89);Muscle weakness (generalized) (M62.81)     Time: [id number] PT Time Calculation (min) (ACUTE ONLY): 15 min  Charges:  $Gait Training: 8-22 mins                    Reinaldo Berber, PT, DPT Acute Rehabilitation Services Pager: [phone] Office: [phone]    Reinaldo Berber 02/15/2019, 12:56 PM

## 2019-02-15 NOTE — Progress Notes (Signed)
Talked to wife and updated her on possible DC on Wednesday depending on bed availability.  Paulla Fore, RN

## 2019-02-15 NOTE — TOC Progression Note (Signed)
Transition of Care [facility]) - Progression Note    Patient Details  Name: Cole Nelson MRN: [id number] Date of Birth: 12-23-1955  Transition of Care [facility]) CM/SW [place], [place] Phone Number: 02/15/2019, 10:39 AM  Clinical Narrative:    [facility] will have a room available on Wednesday in anticipation of having out patient HD set up and COVID results. CSW submitted documents to pasrr and will get new insurance approval.    Expected Discharge Plan: [place] Barriers to Discharge: SNF Pending bed offer(Seeking a bed in [place] or nearby facility in [place] for [place] rehab)  Expected Discharge Plan and Services Expected Discharge Plan: [place] arrangements for the past 2 months: Single Family Home                                       Social Determinants of Health (SDOH) Interventions    Readmission Risk Interventions No flowsheet data found.

## 2019-02-15 NOTE — Progress Notes (Signed)
Renal Navigator spoke with CSW/N. [place] who states SNF will have bed available for patient on Wednesday, 02/17/19. Renal Navigator spoke with [facility] who states they are still awaiting MD acceptance at OP HD clinic in [place]. Renal Navigator spoke with [facility] to inform of discharge plan for Wednesday and hope to start patient at OP HD clinic/[place] Thursday. She stated understanding and will follow up with MD to request acceptance in order to provide Renal Navigator with a seat schedule. Renal Navigator anticipates that patient will be able to start Thursday, but at this point OP HD treatment in [place] is still pending. Renal Navigator updated Nephrologist/Dr. Marval Regal.  Alphonzo Cruise, [place] Renal Navigator [phone]

## 2019-02-15 NOTE — Plan of Care (Signed)
  Problem: Education: Goal: Knowledge of General Education information will improve Description Including pain rating scale, medication(s)/side effects and non-pharmacologic comfort measures Outcome: Progressing   

## 2019-02-15 NOTE — Progress Notes (Signed)
Renal Navigator provided PT note to [facility] per request of more information about patient's ability to transfer and sit for HD. Renal Navigator is still awaiting MD acceptance and seat schedule at OP HD clinic in hopes that patient can start at OP HD clinic/[place] on Thursday, 02/18/19.  Alphonzo Cruise, [place] Renal Navigator [phone]

## 2019-02-15 NOTE — Progress Notes (Signed)
Occupational Therapy Treatment Patient Details Name: Cole Nelson MRN: [id number] DOB: Sep 12, 1955 Today's Date: 02/15/2019    History of present illness 63 year old male with history of ESRD on TTS- iHD via LUE AVF, DM, tobacco abuse, diabetic gastroparesis, GERD, HTN, diastolic HF, multiple sclerosis, CVA w/residual left sided weakness, chronic left pleural effusion, hx cdiff, diabetic neuropathy, anemia, HLD, and depression, was admitted on 7/11 to [facility] with acute shortness of breath and hypertensive urgency.  Found on admit to have complete opacification of left hemithorax. Acute on chronic pleural effusions s/p left VATS, concern for pleural mass vs infectious consolidation   OT comments  Pt progressing toward established goals. Pt currently requires modA for ADL and modA for functional mobility at RW level. Pt demonstrates decreased activity tolerance and quickly fatigues, able to tolerate ambulating 55feet with reported increased fatigue requiring return to sitting. Educated pt on seated UE HEP while seated. Pt will continue to benefit from skilled OT services to maximize safety and independence with ADL/IADL and functional mobility. Will continue to follow acutely and progress as tolerated.      Follow Up Recommendations  SNF;Supervision/Assistance - 24 hour    Equipment Recommendations  3 in 1 bedside commode    Recommendations for Other Services      Precautions / Restrictions Precautions Precautions: Fall Restrictions Weight Bearing Restrictions: No       Mobility Bed Mobility Overal bed mobility: Needs Assistance Bed Mobility: Supine to Sit     Supine to sit: Min assist     General bed mobility comments: minA to progress trunk to upright EOB  Transfers Overall transfer level: Needs assistance Equipment used: Rolling walker (2 wheeled) Transfers: Sit to/from Stand Sit to Stand: Mod assist              Balance Overall balance assessment:  Needs assistance Sitting-balance support: Feet supported Sitting balance-Leahy Scale: Fair   Postural control: Right lateral lean Standing balance support: Bilateral upper extremity supported;During functional activity Standing balance-Leahy Scale: Poor Standing balance comment: reliant on Bil. UE support                            ADL either performed or assessed with clinical judgement   ADL Overall ADL's : Needs assistance/impaired Eating/Feeding: Set up;Sitting Eating/Feeding Details (indicate cue type and reason): RN provided medication at end of session Grooming: Standing;Moderate assistance                   Toilet Transfer: Ambulation;RW;Cueing for safety;Cueing for sequencing;Moderate assistance Toilet Transfer Details (indicate cue type and reason): simulated in room ambulation         Functional mobility during ADLs: Rolling walker;Moderate assistance General ADL Comments: Pt with decreased activity tolerance, fatigues easily;ambulated from EOB to door, required return to sitting due to fatigue;     Vision       Perception     Praxis      Cognition Arousal/Alertness: Awake/alert Behavior During Therapy: WFL for tasks assessed/performed Overall Cognitive Status: No family/caregiver present to determine baseline cognitive functioning Area of Impairment: Attention;Safety/judgement;Awareness;Problem solving                   Current Attention Level: Sustained     Safety/Judgement: Decreased awareness of safety;Decreased awareness of deficits Awareness: Emergent Problem Solving: Slow processing;Decreased initiation;Difficulty sequencing          Exercises  General UE exercises;scapular retraction;neck flexion/extension gentle ROM  Shoulder Instructions       General Comments      Pertinent Vitals/ Pain       Pain Assessment: No/denies pain  Home Living                                          Prior  Functioning/Environment              Frequency  Min 2X/week        Progress Toward Goals  OT Goals(current goals can now be found in the care plan section)  Progress towards OT goals: Progressing toward goals  Acute Rehab OT Goals Patient Stated Goal: to walk more OT Goal Formulation: With patient Time For Goal Achievement: 02/15/19 Potential to Achieve Goals: Good ADL Goals Pt Will Perform Grooming: with min assist;standing Pt Will Perform Lower Body Bathing: with min assist;sit to/from stand Pt Will Perform Lower Body Dressing: with min assist;sit to/from stand Pt Will Transfer to Toilet: with min assist;ambulating;regular height toilet Pt Will Perform Toileting - Clothing Manipulation and hygiene: with min guard assist;sit to/from stand  Plan Discharge plan remains appropriate    Co-evaluation                 AM-PAC OT "6 Clicks" Daily Activity     Outcome Measure   Help from another person eating meals?: A Little Help from another person taking care of personal grooming?: A Little Help from another person toileting, which includes using toliet, bedpan, or urinal?: A Little Help from another person bathing (including washing, rinsing, drying)?: A Lot Help from another person to put on and taking off regular upper body clothing?: A Little Help from another person to put on and taking off regular lower body clothing?: A Little 6 Click Score: 17    End of Session Equipment Utilized During Treatment: Gait belt;Rolling walker  OT Visit Diagnosis: Unsteadiness on feet (R26.81);Other abnormalities of gait and mobility (R26.89);Muscle weakness (generalized) (M62.81)   Activity Tolerance Patient limited by fatigue;Patient tolerated treatment well   Patient Left in chair;with call bell/phone within reach;with chair alarm set   Nurse Communication Mobility status        Time: [id number] OT Time Calculation (min): 13 min  Charges: OT General Charges $OT  Visit: 1 Visit OT Treatments $Self Care/Home Management : 8-22 mins  Dorinda Hill OTR/L Acute Rehabilitation Services Office: [place] 02/15/2019, 10:13 AM

## 2019-02-16 ENCOUNTER — Encounter (HOSPITAL_COMMUNITY): Payer: Self-pay

## 2019-02-16 LAB — NOVEL CORONAVIRUS, NAA (HOSP ORDER, SEND-OUT TO REF LAB; TAT 18-24 HRS): SARS-CoV-2, NAA: NOT DETECTED

## 2019-02-16 LAB — CBC
HCT: 27.2 % — ABNORMAL LOW (ref 39.0–52.0)
Hemoglobin: 8.6 g/dL — ABNORMAL LOW (ref 13.0–17.0)
MCH: 25.6 pg — ABNORMAL LOW (ref 26.0–34.0)
MCHC: 31.6 g/dL (ref 30.0–36.0)
MCV: 81 fL (ref 80.0–100.0)
Platelets: 246 10*3/uL (ref 150–400)
RBC: 3.36 MIL/uL — ABNORMAL LOW (ref 4.22–5.81)
RDW: 15.2 % (ref 11.5–15.5)
WBC: 5.2 10*3/uL (ref 4.0–10.5)
nRBC: 0 % (ref 0.0–0.2)

## 2019-02-16 LAB — BASIC METABOLIC PANEL
Anion gap: 15 (ref 5–15)
BUN: 67 mg/dL — ABNORMAL HIGH (ref 8–23)
CO2: 23 mmol/L (ref 22–32)
Calcium: 9.2 mg/dL (ref 8.9–10.3)
Chloride: 88 mmol/L — ABNORMAL LOW (ref 98–111)
Creatinine, Ser: 8.18 mg/dL — ABNORMAL HIGH (ref 0.61–1.24)
GFR calc Af Amer: 7 mL/min — ABNORMAL LOW (ref >60)
GFR calc non Af Amer: 6 mL/min — ABNORMAL LOW (ref >60)
Glucose, Bld: 123 mg/dL — ABNORMAL HIGH (ref 70–99)
Potassium: 5.2 mmol/L — ABNORMAL HIGH (ref 3.5–5.1)
Sodium: 126 mmol/L — ABNORMAL LOW (ref 135–145)

## 2019-02-16 LAB — GLUCOSE, CAPILLARY
Glucose-Capillary: 115 mg/dL — ABNORMAL HIGH (ref 70–99)
Glucose-Capillary: 119 mg/dL — ABNORMAL HIGH (ref 70–99)
Glucose-Capillary: 149 mg/dL — ABNORMAL HIGH (ref 70–99)
Glucose-Capillary: 154 mg/dL — ABNORMAL HIGH (ref 70–99)
Glucose-Capillary: 198 mg/dL — ABNORMAL HIGH (ref 70–99)

## 2019-02-16 MED ORDER — DARBEPOETIN ALFA 60 MCG/0.3ML IJ SOSY
PREFILLED_SYRINGE | INTRAMUSCULAR | Status: AC
  Filled 2019-02-16: qty 0.3

## 2019-02-16 MED ORDER — HYDROCODONE-ACETAMINOPHEN 5-325 MG PO TABS: 1.0000 | ORAL_TABLET | Freq: Four times a day (QID) | ORAL | 0 refills | Status: DC | PRN

## 2019-02-16 MED ORDER — LISINOPRIL 10 MG PO TABS: 10.0000 mg | ORAL_TABLET | Freq: Every day | ORAL | Status: DC

## 2019-02-16 MED ORDER — DOXERCALCIFEROL 4 MCG/2ML IV SOLN
INTRAVENOUS | Status: AC
  Administered 2019-02-16: 1 ug via INTRAVENOUS
  Filled 2019-02-16: qty 2

## 2019-02-16 MED ORDER — TRAMADOL HCL 50 MG PO TABS: 50.0000 mg | ORAL_TABLET | Freq: Two times a day (BID) | ORAL | 0 refills | Status: DC | PRN

## 2019-02-16 NOTE — Progress Notes (Signed)
Pt arrived from HD, pt denies any pain, VSS and respirations even and unlabored. Pt has call bell within reach and bed alarm on.  Paulla Fore, RN

## 2019-02-16 NOTE — Discharge Summary (Addendum)
Physician Discharge Summary  Cole Nelson [id number] DOB: 05-29-1956 DOA: 01/28/2019  PCP: Patient, No Pcp Per  Admit date: 01/28/2019 Discharge date: 02/17/2019 Admitted From: home Disposition:  SNF  Recommendations for Outpatient Follow-up:  1. Follow up with PCP in 1-2 weeks 2. Follow-up with outpatient HD as scheduled  Home Health: none Equipment/Devices: none  Discharge Condition: stable CODE STATUS: Full code Diet recommendation: renal   HPI: Per admitting MD, HPI obtained from medical chart review as patient is encephalopathic. 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 t baseline lives with wife, independent of ADLs, and occasionally uses walker/ wheelchair for mobility.  Admitted on 7/11 to [facility] with acute shortness of breath and hypertensive urgency.  Found on admit to have complete opacification of left hemithorax.  Previous thoracentesis in September 02 2018 was transudative.  Underwent left thoracentesis on July 11 with 2300 ml of dark red fluid removed, found to be exudative with cytology negative for neoplasm.  Underwent left VATS on 7/13 with placement of chest tube found to have pleural pulsatile masses.  CT chest then performed which showed consolidation or mass along the pleural surface of the anterior lateral left upper lobe.  The case was discussed with cardiothoracic at [facility] with some question that this was acute consolidation verses rather than a mass and recommended further outpatient follow up after completion of antibiotic therapy.  Patient was continued on levofloxacin. During his hospitalization, patient with recurrent bouts of nausea and vomiting in which wife had reported that patient has been having 3 to 4 times  a month over the last year.  CT abdomen obtained showed marked gastric distention with dilation of the 2nd and proximal 3rd portions of the duodenum possible narrowing at the level of the hiatus between the aorta and superior mesenteric artery consistent with SMA syndrome.  An nasal gastric tube was placed, pulled out by patient and since replaced.  No nasal jejunostomy tubes available there and has remained NPO. Additionally, patient with with SIRS and fever with worsening confusion vs delirum.  Vancomycin was added. Some recent bouts of hypotension improved after unclear amount of fluid boluses.  Given his chronic and acute issues, it was recommended he be transferred to a tertiary center for further evaluation and management.  PCCM accepted to ICU given recent hemodynamic instability.   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 He underwent an enteroscopy on 7/25 and was ruled out SMA, findings mostly concerning for gastroparesis.  He was given Reglan with clinical improvement, discontinued on 8/5 and has remained clinically stable.  He is able to tolerate a regular diet.  Left-sided pleural effusion, recurrent -Seen by cardiothoracic surgery at [facility], status post left VATS evacuation of fluid on 01/18/2019 -Respiratory status stable, continue outpatient follow-up with cardiothoracic surgery at [facility]  ESRD on HD -Clinically euvolemic; dialysis as per nephrology schedule.  Outpatient follow-up with outpatient dialysis.  Type 2 diabetes mellitus -resume home medications   MS -SNF placement, he is quite debilitated. Covid negative on d/c  Lab Results  Component Value Date   SARSCOV2NAA NOT DETECTED 02/15/2019   [place] NEGATIVE 01/28/2019   Severe protein calorie malnutrition -Continue nutritional supplements  Right buttock stage II pressure ulcer, POA -Local  wound  care  Hypertension -Continue medications as below, blood pressure stable  Iron deficiency anemia -Continue supplements  Addendum on 02/17/2019 Patient is waiting to be discharged to SNF. He is currently medically stable for discharge.   Discharge Instructions   Allergies as of 02/16/2019      Reactions   Baclofen    confusion   Cefepime    Confusion, hives    Insulins    Patient states his mind started going in and out.    Penicillins       Medication List    STOP taking these medications   irbesartan 300 MG tablet Commonly known as: AVAPRO     TAKE these medications   amLODipine 5 MG tablet Commonly known as: NORVASC Take 5 mg by mouth 2 (two) times a day.   aspirin 81 MG chewable tablet Chew 81 mg by mouth daily.   atorvastatin 20 MG tablet Commonly known as: LIPITOR Take 20 mg by mouth daily.   b complex-vitamin c-folic acid 0.8 MG Tabs tablet Take 1 tablet by mouth daily.   carvedilol 25 MG tablet Commonly known as: COREG Take 25 mg by mouth 2 (two) times a day.   cholecalciferol 25 MCG (1000 UT) tablet Commonly known as: VITAMIN D Take 25 mcg by mouth daily.   cloNIDine 0.3 MG tablet Commonly known as: CATAPRES Take 0.3 mg by mouth 2 (two) times a day.   Copaxone 40 MG/ML Sosy Generic drug: Glatiramer Acetate Inject 40 mg as directed 3 (three) times a week.   dicyclomine 20 MG tablet Commonly known as: BENTYL Take 20 mg by mouth 3 (three) times daily as needed for spasms.   ferrous sulfate 325 (65 FE) MG tablet Take 325 mg by mouth daily.   fexofenadine 180 MG tablet Commonly known as: ALLEGRA Take 180 mg by mouth daily.   Flovent Diskus 100 MCG/BLIST Aepb Generic drug: Fluticasone Propionate (Inhal) Inhale 100 mcg into the lungs 2 (two) times a day.   FLUoxetine 20 MG capsule Commonly known as: PROZAC Take 20 mg by mouth daily.   fluticasone 50 MCG/ACT nasal spray Commonly known as: FLONASE Place 1 spray into both nostrils as  needed for allergies.   Folic Acid-Vit Q000111Q 123456 0.5-5-0.2 MG Tabs Take 1 tablet by mouth daily.   gabapentin 100 MG capsule Commonly known as: NEURONTIN Take 100 mg by mouth 2 (two) times a day.   hydrALAZINE 100 MG tablet Commonly known as: APRESOLINE Take 100 mg by mouth 3 (three) times daily.   HYDROcodone-acetaminophen 5-325 MG tablet Commonly known as: NORCO/VICODIN Take 1 tablet by mouth every 6 (six) hours as needed. What changed: reasons to take this   Levemir FlexTouch 100 UNIT/ML Pen Generic drug: Insulin Detemir Inject 10 Units as directed at bedtime as needed.   lisinopril 10 MG tablet Commonly known as: ZESTRIL Take 1 tablet (10 mg total) by mouth daily. Start taking on: February 17, 2019   montelukast 10 MG tablet Commonly known as: SINGULAIR Take 10 mg by mouth daily.   ondansetron 4 MG disintegrating tablet Commonly known as: ZOFRAN-ODT Take 4 mg by mouth every 4 (four) hours as needed for nausea/vomiting.   PriLOSEC 20 MG capsule Generic drug: omeprazole Take 20 mg by mouth daily.   Tradjenta 5 MG Tabs tablet Generic drug: linagliptin Take 5 mg by mouth daily.   traMADol 50 MG tablet Commonly known as: ULTRAM Take 1 tablet (50 mg total) by mouth every 12 (twelve) hours as needed  for moderate pain.   traZODone 50 MG tablet Commonly known as: DESYREL Take 50 mg by mouth at bedtime.         Consultations:  Nephrology   GI  General surgery  Vascular  PCCM  Procedures/Studies:  EGD 7/26 Impression: - Normal esophagus. - A small amount of food (residue) in the stomach.  Consistent with gastroparesis. - Scar in the gastric body (likely from healed  ulcer) with scattered gastric erosions. Biopsied. 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 Biopsied. - No evidence of inflammation, stricture, narrowing  or obstruction in the examined duodenum and  proximal jejunum.  Ct Chest Wo Contrast  Result Date: 02/01/2019 CLINICAL DATA:  Chronic LEFT hydropneumothorax. Thoracentesis 7 05/2019. EXAM: CT CHEST WITHOUT CONTRAST TECHNIQUE: Multidetector CT imaging of the chest was performed following the standard protocol without IV contrast. COMPARISON:  CT abdomen 01/28/2019, chest radiograph 01/28/2019 FINDINGS: Cardiovascular: Coronary artery calcification and aortic atherosclerotic calcification. Mediastinum/Nodes: No axillary lymphadenopathy. No supraclavicular adenopathy. There is nodule enlargement of the thyroid gland which is symmetric side-to-side. No clear mediastinal adenopathy on noncontrast exam. Mediastinal tissue planes are poorly delineated. Lungs/Pleura: Large complex LEFT hydropneumothorax again demonstrated. There is a air-fluid level in the lower lobe with some interspersed gas. Large pneumothorax involving the upper lobe. There is atelectasis of the LEFT upper lobe and LEFT lower lobe. There is pleural thickening along the upper lobe lung surface (image 50/4). In the RIGHT lung there is a small RIGHT effusion with nodular pleuroparenchymal thickening at the RIGHT lung base. Example nodule measuring 10 mm on image 123/4. Small ground-glass density in the RIGHT upper lobe without focality. Upper Abdomen: Limited view of the liver, kidneys, pancreas are unremarkable. Normal adrenal glands. Musculoskeletal: No aggressive osseous lesion IMPRESSION: 1. Large complex LEFT hydropneumothorax with the majority of the LEFT hemithorax occupied by gas. 2. Severe atelectasis of the LEFT upper lobe and LEFT lower lobe  with pleural thickening. 3. Minimal pleural fluid at the LEFT lung base with interspersed gas. 4. No clear mediastinal adenopathy although difficult to evaluate without contrast. 5. Atelectasis and effusion on the RIGHT with peripheral nodularity lower lobe is favored infectious or inflammatory. Recommend attention on follow-up. 6. Enlargement of the thyroid gland is favored benign goiter. Electronically Signed   By: Suzy Bouchard M.D.   On: 02/01/2019 12:10   Ct Abdomen Pelvis W Contrast  Result Date: 01/28/2019 CLINICAL DATA:  Abdominal pain, history of possible SMA syndrome on outside CT. EXAM: CT ABDOMEN AND PELVIS WITH CONTRAST TECHNIQUE: Multidetector CT imaging of the abdomen and pelvis was performed using the standard protocol following bolus administration of intravenous contrast. CONTRAST:  164mL OMNIPAQUE IOHEXOL 300 MG/ML  SOLN COMPARISON:  Plain film from earlier in the same day. FINDINGS: Lower chest: Right lung base demonstrates small right pleural effusion with basilar atelectasis. There is a large left basilar hydropneumothorax similar to that seen on recent chest x-ray. By history this is of a chronic nature. Hepatobiliary: Vicarious excretion of contrast is noted within the gallbladder. The liver is within normal limits. Pancreas: Unremarkable. No pancreatic ductal dilatation or surrounding inflammatory changes. Spleen: Normal in size without focal abnormality. Adrenals/Urinary Tract: Adrenal glands are within normal limits. Kidneys demonstrates scattered cystic changes bilaterally without obstructive change. No definitive renal calculi are seen. The bladder is decompressed. Stomach/Bowel: Diverticular change of the colon is noted without evidence  of diverticulitis. No obstructive or inflammatory changes are seen. The appendix is not well visualized. No inflammatory changes are identified to suggest appendicitis. The stomach is decompressed by a nasogastric catheter. The space between the  SMA and aorta is mildly narrowed with some compression upon the third portion of the duodenum although no proximal dilatation is seen due to the gastric drainage catheter. The angle of the SMA and aorta appears within normal limits. Vascular/Lymphatic: Aortic atherosclerosis. No enlarged abdominal or pelvic lymph nodes. Reproductive: Prostate is unremarkable. Other: No abdominal wall hernia or abnormality. No abdominopelvic ascites. Musculoskeletal: Degenerative changes of the lumbar spine IMPRESSION: Large left hydropneumothorax which by history is chronic in nature. Normal appearing angle between the aorta and superior mesenteric artery although there is some mild compression upon the third portion of the duodenum on the axial images. The degree of obstruction is incompletely evaluated due to decompression by the nasogastric catheter. Comparison with the previous images would be helpful if they could be made available. Diverticulosis without diverticulitis. Small right-sided pleural effusion. Electronically Signed   By: Inez Catalina M.D.   On: 01/28/2019 21:04   Dg Chest Port 1 View  Result Date: 01/28/2019 CLINICAL DATA:  Pleural effusion and left basilar pneumothorax EXAM: PORTABLE CHEST 1 VIEW COMPARISON:  None available FINDINGS: Enteric tube tip at the stomach with side port at the GE junction. Gas collection at the left base. There is history of recent VATs with no comparison imaging available in this patient who is a recent transfer. Mild left-sided lung opacity apically. The right lung is clear. Normal heart size. Left axillary stenting. Mild left axillary soft tissue emphysema IMPRESSION: 1. Moderate gas collection at the left base in this patient with history of basal pneumothorax. 2. Nonspecific left pulmonary opacity. Electronically Signed   By: Monte Fantasia M.D.   On: 01/28/2019 08:05     Subjective: - no chest pain, shortness of breath, no abdominal pain, nausea or vomiting.   Discharge  Exam: BP 137/65 (BP Location: Right Arm)    Pulse 76    Temp 99.1 F (37.3 C) (Oral)    Resp 18    Ht 5\' 11"  (1.803 m)    Wt 59 kg    SpO2 98%    BMI 18.14 kg/m   General: Pt is alert, awake, not in acute distress Cardiovascular: RRR, S1/S2 +, no rubs, no gallops Respiratory: CTA bilaterally, no wheezing, no rhonchi Abdominal: Soft, NT, ND, bowel sounds + Extremities: no edema, no cyanosis    The results of significant diagnostics from this hospitalization (including imaging, microbiology, ancillary and laboratory) are listed below for reference.     Microbiology: Recent Results (from the past 240 hour(s))  Novel Coronavirus, NAA (hospital order; send-out to ref lab)     Status: None   Collection Time: 02/15/19  4:37 PM   Specimen: Nasopharyngeal Swab; Respiratory  Result Value Ref Range Status   SARS-CoV-2, NAA NOT DETECTED NOT DETECTED Final    Comment: (NOTE) This test was developed and its performance characteristics determined by [employer]. This test has not been FDA cleared or approved. This test has been authorized by FDA under an Emergency Use Authorization (EUA). This test is only authorized for the duration of time the declaration that circumstances exist justifying the authorization of the emergency use of in vitro diagnostic tests for detection of SARS-CoV-2 virus and/or diagnosis of COVID-19 infection under section 564(b)(1) of the Act, 21 U.S.C. KA:123727), unless the authorization is terminated or  revoked sooner. When diagnostic testing is negative, the possibility of a false negative result should be considered in the context of a patient's recent exposures and the presence of clinical signs and symptoms consistent with COVID-19. An individual without symptoms of COVID-19 and who is not shedding SARS-CoV-2 virus would expect to have a negative (not detected) result in this assay. Performed  At: [facility] [address] [place],  [place] [id number] Rush Farmer MD [id number]    Sharon  Final    Comment: Performed at [facility], [place] [address], [place], [place] [address]     Labs: BNP (last 3 results) No results for input(s): BNP in the last 8760 hours. Basic Metabolic Panel: Recent Labs  Lab 02/09/19 1442  02/11/19 1333 02/12/19 0425 02/13/19 0427 02/14/19 0534 02/15/19 0343 02/16/19 0518  NA 128*   < > 130* 130* 131* 132* 129* 126*  K 4.5   < > 3.9 3.7 3.9 4.2 4.6 5.2*  CL 91*   < > 92* 91* 91* 92* 91* 88*  CO2 24   < > 25 28 27 27 25 23   GLUCOSE 122*   < > 131* 95 141* 79 126* 123*  BUN 63*   < > 47* 24* 39* 28* 47* 67*  CREATININE 7.74*   < > 6.87* 4.34* 6.07* 4.34* 6.37* 8.18*  CALCIUM 8.6*   < > 8.9 8.8* 9.4 9.2 9.2 9.2  PHOS 5.3*  --  4.0  --   --   --   --   --    < > = values in this interval not displayed.   Liver Function Tests: Recent Labs  Lab 02/09/19 1442 02/11/19 1333  ALBUMIN 2.2* 2.5*   No results for input(s): LIPASE, AMYLASE in the last 168 hours. No results for input(s): AMMONIA in the last 168 hours. CBC: Recent Labs  Lab 02/10/19 0509 02/11/19 1333 02/12/19 0425 02/13/19 0717 02/14/19 0534 02/16/19 0518  WBC 8.1 4.5 4.7 5.5 6.3 5.2  NEUTROABS 6.6  --  3.2  --  4.2  --   HGB 8.9* 8.9* 9.2* 8.8* 9.2* 8.6*  HCT 28.5* 28.3* 29.8* 28.3* 29.5* 27.2*  MCV 81.7 81.3 81.9 81.3 81.5 81.0  PLT 299 297 304 281 256 246   Cardiac Enzymes: No results for input(s): CKTOTAL, CKMB, CKMBINDEX, TROPONINI in the last 168 hours. BNP: Invalid input(s): POCBNP CBG: Recent Labs  Lab 02/15/19 1642 02/15/19 2036 02/16/19 0029 02/16/19 0432 02/16/19 1109  GLUCAP 137* 203* 154* 115* 119*   D-Dimer No results for input(s): DDIMER in the last 72 hours. Hgb A1c No results for input(s): HGBA1C in the last 72 hours. Lipid Profile No results for input(s): CHOL, HDL, LDLCALC, TRIG, CHOLHDL, LDLDIRECT in the last 72 hours. Thyroid function  studies No results for input(s): TSH, T4TOTAL, T3FREE, THYROIDAB in the last 72 hours.  Invalid input(s): FREET3 Anemia work up No results for input(s): VITAMINB12, FOLATE, FERRITIN, TIBC, IRON, RETICCTPCT in the last 72 hours. Urinalysis No results found for: COLORURINE, APPEARANCEUR, [place], [place], GLUCOSEU, [place], [place], [place], PROTEINUR, UROBILINOGEN, NITRITE, LEUKOCYTESUR Sepsis Labs Invalid input(s): PROCALCITONIN,  WBC,  LACTICIDVEN  FURTHER DISCHARGE INSTRUCTIONS:   Get Medicines reviewed and adjusted: Please take all your medications with you for your next visit with your Primary MD   Laboratory/radiological data: Please request your Primary MD to go over all hospital tests and procedure/radiological results at the follow up, please ask your Primary MD to get all Hospital records  sent to his/her office.   In some cases, they will be blood work, cultures and biopsy results pending at the time of your discharge. Please request that your primary care M.D. goes through all the records of your hospital data and follows up on these results.   Also Note the following: If you experience worsening of your admission symptoms, develop shortness of breath, life threatening emergency, suicidal or homicidal thoughts you must seek medical attention immediately by calling 911 or calling your MD immediately  if symptoms less severe.   You must read complete instructions/literature along with all the possible adverse reactions/side effects for all the Medicines you take and that have been prescribed to you. Take any new Medicines after you have completely understood and accpet all the possible adverse reactions/side effects.    Do not drive when taking Pain medications or sleeping medications (Benzodaizepines)   Do not take more than prescribed Pain, Sleep and Anxiety Medications. It is not advisable to combine anxiety,sleep and pain medications without talking with your primary care  practitioner   Special Instructions: If you have smoked or chewed Tobacco  in the last 2 yrs please stop smoking, stop any regular Alcohol  and or any Recreational drug use.   Wear Seat belts while driving.   Please note: You were cared for by a hospitalist during your hospital stay. Once you are discharged, your primary care physician will handle any further medical issues. Please note that NO REFILLS for any discharge medications will be authorized once you are discharged, as it is imperative that you return to your primary care physician (or establish a relationship with a primary care physician if you do not have one) for your post hospital discharge needs so that they can reassess your need for medications and monitor your lab values.  Time coordinating discharge: 40 minutes  SIGNED:  Marzetta Board, MD, PhD 02/16/2019, 1:50 PM

## 2019-02-16 NOTE — Procedures (Signed)
I was present at this dialysis session. I have reviewed the session itself and made appropriate changes.   Vital signs in last 24 hours:  Temp:  [98.1 F (36.7 C)-99 F (37.2 C)] 98.5 F (36.9 C) (08/11 0649) Pulse Rate:  [66-78] 66 (08/11 0655) Resp:  [18] 18 (08/11 0649) BP: (136-161)/(62-76) 161/74 (08/11 0655) SpO2:  [96 %-100 %] 99 % (08/11 0649) Weight:  [58.9 kg-62.4 kg] 62.4 kg (08/11 0649) Weight change: 0 kg Filed Weights   02/15/19 0500 02/15/19 2036 02/16/19 0649  Weight: 58.9 kg 58.9 kg 62.4 kg    Recent Labs  Lab 02/11/19 1333  02/16/19 0518  NA 130*   < > 126*  K 3.9   < > 5.2*  CL 92*   < > 88*  CO2 25   < > 23  GLUCOSE 131*   < > 123*  BUN 47*   < > 67*  CREATININE 6.87*   < > 8.18*  CALCIUM 8.9   < > 9.2  PHOS 4.0  --   --    < > = values in this interval not displayed.    Recent Labs  Lab 02/10/19 0509  02/12/19 0425 02/13/19 0717 02/14/19 0534 02/16/19 0518  WBC 8.1   < > 4.7 5.5 6.3 5.2  NEUTROABS 6.6  --  3.2  --  4.2  --   HGB 8.9*   < > 9.2* 8.8* 9.2* 8.6*  HCT 28.5*   < > 29.8* 28.3* 29.5* 27.2*  MCV 81.7   < > 81.9 81.3 81.5 81.0  PLT 299   < > 304 281 256 246   < > = values in this interval not displayed.    Scheduled Meds: . amLODipine  5 mg Oral BID  . aspirin  81 mg Oral Daily  . budesonide  0.25 mg Nebulization BID  . carvedilol  25 mg Oral BID  . Chlorhexidine Gluconate Cloth  6 each Topical Q0600  . Chlorhexidine Gluconate Cloth  6 each Topical Q0600  . Chlorhexidine Gluconate Cloth  6 each Topical Q0600  . cloNIDine  0.3 mg Oral BID  . darbepoetin (ARANESP) injection - DIALYSIS  60 mcg Intravenous Q Tue-HD  . doxercalciferol  1 mcg Intravenous Q T,Th,Sa-HD  . feeding supplement (NEPRO CARB STEADY)  237 mL Oral BID BM  . feeding supplement (PRO-STAT SUGAR FREE 64)  30 mL Oral BID  . ferrous sulfate  325 mg Oral Daily  . FLUoxetine  20 mg Oral Daily  . heparin  5,000 Units Subcutaneous Q8H  . hydrALAZINE  100 mg Oral  TID  . lisinopril  10 mg Oral Daily  . mouth rinse  15 mL Mouth Rinse BID  . montelukast  10 mg Oral Daily  . multivitamin  1 tablet Oral QHS  . nystatin  5 mL Oral QID  . pantoprazole  40 mg Oral Q0600   Continuous Infusions: . sodium chloride 250 mL (02/01/19 0928)   PRN Meds:.sodium chloride, acetaminophen, fluticasone, ipratropium-albuterol, labetalol, ondansetron (ZOFRAN) IV, traMADol    Dialysis Prescription[facility] [place] Optiflux 180, EDW 77kg, 3hr 98min,BFR 450/DFR 800,2K/2.5Ca, Epogen 10,000 units TIW, Hectorol 71mcg TIW, ONS, LUA AVF 15G, Standard Sodium, Heparin 2000units load and 700units/hr  Assessment/Plan:  1. ESRD- continue on TTS schedule via AVF.  Continue with his regular schedule 2. Acute on chronic pleural effusions s/p VATS- completed abx with levaquin and vancomycin.  Will need f/u with [facility] thoracic surgery 3. Anemia- cont with  ESA 4. CKD-MB- stable 5. Nutrition- cont with renal diet.  6. HTN- requires 5 meds.  Cont to follow. 7. Disposition- will need SNF placement at [facility] in [place] and transfer to [facility] (normally at [facility]) which has been arranged but no bed available until 02/17/19.  Awaiting MD acceptance at [facility].  Plan for discharge 8/12 and HD 8/13 as an outpatient.   Donetta Potts,  MD 02/16/2019, 8:36 AM

## 2019-02-16 NOTE — Progress Notes (Signed)
Renal Navigator faxed Discharge Summary and 02/16/19 Nephrology note to [facility] to provide continuity of care.   Alphonzo Cruise, [place] Renal Navigator [phone]

## 2019-02-16 NOTE — Progress Notes (Signed)
Renal Navigator received call from [facility] who states patient has been accepted for OP HD treatment at [facility] clinic on a TTS schedule with a seat time of 11:30am. He can start in the clinic on Thursday, 02/18/19. On this first day of treatment, he needs to arrive at the clinic at 9:30am to complete paperwork and meet with the MD.  Renal Navigator notified Nephrologist Dr. Marval Regal, met with patient in HD to inform, and will update CSW/V. [place].  Alphonzo Cruise, [place] Renal Navigator [phone]

## 2019-02-16 NOTE — Clinical Social Work Note (Addendum)
Patient medically ready for discharge to [facility] today, however CSW was informed via call from admissions director [name] at 3:15 pm that they would not be able to accept patient due to a COVID situation. MD and patient's wife informed. Contact was made with [facility] in [place] and message left for [name], [employer] has HD was changed to [facility]. CSW will follow-up with [name] on 8/12 to determine if her facility can accept patient.  Cole Nelson, MSW, LCSW Licensed Clinical Social Worker [place] [phone]

## 2019-02-17 LAB — GLUCOSE, CAPILLARY
Glucose-Capillary: 105 mg/dL — ABNORMAL HIGH (ref 70–99)
Glucose-Capillary: 137 mg/dL — ABNORMAL HIGH (ref 70–99)
Glucose-Capillary: 137 mg/dL — ABNORMAL HIGH (ref 70–99)
Glucose-Capillary: 180 mg/dL — ABNORMAL HIGH (ref 70–99)
Glucose-Capillary: 206 mg/dL — ABNORMAL HIGH (ref 70–99)

## 2019-02-17 NOTE — Progress Notes (Signed)
Patient ID: Cole Nelson, male   DOB: 07-Jan-1956, 63 y.o.   MRN: [id number] Patient is waiting to be discharged to SNF.  Patient seen and examined at bedside.  He is currently medically stable for discharge.  Please refer to the discharge summary done by Dr. Marzetta Board on 02/16/2019 for full details.

## 2019-02-17 NOTE — Progress Notes (Addendum)
DISCHARGE NOTE HOME Cole Nelson to be discharged SNF per MD order. Discussed prescriptions and follow up appointments with the patient. Prescriptions given to patient; medication list explained in detail. Patient verbalized understanding.  Skin clean, dry and intact without evidence of skin break down, no evidence of skin tears noted. IV catheter discontinued intact. Site without signs and symptoms of complications. Dressing and pressure applied. Pt denies pain at the site currently. No complaints noted.  Patient free of lines, drains, and wounds.   An After Visit Summary (AVS) was printed and given to the patient. Patient escorted via wheelchair, and discharged home via private auto.  Arlyss Repress, RN

## 2019-02-17 NOTE — Progress Notes (Signed)
Patient ID: Cole Nelson, male   DOB: 02-27-1956, 63 y.o.   MRN: [id number] S: No new complaints O:BP (!) 187/71 (BP Location: Right Arm)   Pulse 96   Temp 99.9 F (37.7 C) (Oral)   Resp 18   Ht 5\' 11"  (1.803 m)   Wt 61.2 kg   SpO2 98%   BMI 18.82 kg/m   Intake/Output Summary (Last 24 hours) at 02/17/2019 1232 Last data filed at 02/17/2019 1005 Gross per 24 hour  Intake 720 ml  Output 0 ml  Net 720 ml   Intake/Output: I/O last 3 completed shifts: In: [id number] [P.O.:920] Out: 2990 [Other:2990]  Intake/Output this shift:  No intake/output data recorded. Weight change: 0.1 kg Gen: NAD CVS: no rub  Resp:cta Abd: benign Ext: no edema, LUE AVF +T/B  Recent Labs  Lab 02/11/19 1333 02/12/19 0425 02/13/19 0427 02/14/19 0534 02/15/19 0343 02/16/19 0518  NA 130* 130* 131* 132* 129* 126*  K 3.9 3.7 3.9 4.2 4.6 5.2*  CL 92* 91* 91* 92* 91* 88*  CO2 25 28 27 27 25 23   GLUCOSE 131* 95 141* 79 126* 123*  BUN 47* 24* 39* 28* 47* 67*  CREATININE 6.87* 4.34* 6.07* 4.34* 6.37* 8.18*  ALBUMIN 2.5*  --   --   --   --   --   CALCIUM 8.9 8.8* 9.4 9.2 9.2 9.2  PHOS 4.0  --   --   --   --   --    Liver Function Tests: Recent Labs  Lab 02/11/19 1333  ALBUMIN 2.5*   No results for input(s): LIPASE, AMYLASE in the last 168 hours. No results for input(s): AMMONIA in the last 168 hours. CBC: Recent Labs  Lab 02/11/19 1333 02/12/19 0425 02/13/19 0717 02/14/19 0534 02/16/19 0518  WBC 4.5 4.7 5.5 6.3 5.2  NEUTROABS  --  3.2  --  4.2  --   HGB 8.9* 9.2* 8.8* 9.2* 8.6*  HCT 28.3* 29.8* 28.3* 29.5* 27.2*  MCV 81.3 81.9 81.3 81.5 81.0  PLT 297 304 281 256 246   Cardiac Enzymes: No results for input(s): CKTOTAL, CKMB, CKMBINDEX, TROPONINI in the last 168 hours. CBG: Recent Labs  Lab 02/16/19 2008 02/17/19 0002 02/17/19 0408 02/17/19 0740 02/17/19 1201  GLUCAP 149* 180* 206* 137* 105*    Iron Studies: No results for input(s): IRON, TIBC, TRANSFERRIN, FERRITIN in the last 72  hours. Studies/Results: No results found. Marland Kitchen amLODipine  5 mg Oral BID  . aspirin  81 mg Oral Daily  . budesonide  0.25 mg Nebulization BID  . carvedilol  25 mg Oral BID  . Chlorhexidine Gluconate Cloth  6 each Topical Q0600  . Chlorhexidine Gluconate Cloth  6 each Topical Q0600  . Chlorhexidine Gluconate Cloth  6 each Topical Q0600  . cloNIDine  0.3 mg Oral BID  . darbepoetin (ARANESP) injection - DIALYSIS  60 mcg Intravenous Q Tue-HD  . doxercalciferol  1 mcg Intravenous Q T,Th,Sa-HD  . feeding supplement (NEPRO CARB STEADY)  237 mL Oral BID BM  . feeding supplement (PRO-STAT SUGAR FREE 64)  30 mL Oral BID  . ferrous sulfate  325 mg Oral Daily  . FLUoxetine  20 mg Oral Daily  . heparin  5,000 Units Subcutaneous Q8H  . hydrALAZINE  100 mg Oral TID  . lisinopril  10 mg Oral Daily  . mouth rinse  15 mL Mouth Rinse BID  . montelukast  10 mg Oral Daily  . multivitamin  1 tablet Oral QHS  .  nystatin  5 mL Oral QID  . pantoprazole  40 mg Oral Q0600    BMET    Component Value Date/Time   NA 126 (L) 02/16/2019 0518   K 5.2 (H) 02/16/2019 0518   CL 88 (L) 02/16/2019 0518   CO2 23 02/16/2019 0518   GLUCOSE 123 (H) 02/16/2019 0518   BUN 67 (H) 02/16/2019 0518   CREATININE 8.18 (H) 02/16/2019 0518   CALCIUM 9.2 02/16/2019 0518   GFRNONAA 6 (L) 02/16/2019 0518   GFRAA 7 (L) 02/16/2019 0518   CBC    Component Value Date/Time   WBC 5.2 02/16/2019 0518   RBC 3.36 (L) 02/16/2019 0518   HGB 8.6 (L) 02/16/2019 0518   HCT 27.2 (L) 02/16/2019 0518   PLT 246 02/16/2019 0518   MCV 81.0 02/16/2019 0518   MCH 25.6 (L) 02/16/2019 0518   MCHC 31.6 02/16/2019 0518   RDW 15.2 02/16/2019 0518   LYMPHSABS 0.7 02/14/2019 0534   MONOABS 0.9 02/14/2019 0534   EOSABS 0.5 02/14/2019 0534   BASOSABS 0.1 02/14/2019 0534    Dialysis [facility] [place] Optiflux 180, EDW 77kg, 3hr 55min,BFR 450/DFR 800,2K/2.5Ca, Epogen 10,000 units TIW, Hectorol 62mcg TIW, ONS, LUA AVF 15G,  Standard Sodium, Heparin 2000units load and 700units/hr  Assessment/Plan:  1. ESRD- continue on TTS schedule via AVF. Continue with his regular schedule 2. Acute on chronic pleural effusions s/p VATS- completed abx with levaquin and vancomycin. Will need f/u with [facility] thoracic surgery 3. Anemia- cont with ESA 4. CKD-MB- stable 5. Nutrition- cont with renal diet.  6. HTN- requires 5 meds. Cont to follow. 7. Disposition- accepted to [facility] in [place] and will temporarily transfer to [facility] (normally at [facility]). Awaiting MD appointment at [facility]. Plan for discharge 8/12 and HD 8/13 as an outpatient.   Donetta Potts, MD [employer] [phone]

## 2019-02-17 NOTE — Progress Notes (Signed)
Nutrition Follow-up  DOCUMENTATION CODES:   Underweight, Severe malnutrition in context of chronic illness  INTERVENTION:   -D/C Nepro    Continue30 ml Prostat BID, each supplement provides 100 kcals and 15 grams protein  Continue renal MVI daily  NUTRITION DIAGNOSIS:   Severe Malnutrition related to chronic illness(ESRD on HD, CHF) as evidenced by severe muscle depletion, severe fat depletion.  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 PMH of ESRD on HD, DM, diabetic gastroparesis, GERD, HTN, CHF, MS, CVA with residual left-sided weakness, HLD, anemia.   7/24 - NG tube d/c, clear liquids 7/26- diet advanced renal/CM  Pt scheduled to d/c yesterday but had issues with placement. Plan for possible d/c to different facility today. Spoke with RN via phone. Pt complaining of abdomen pain with nausea. Resolved with Zofran. Meal completions charted as 20-75% for his last three meals. He is not drinking Nepro. RD to d/c.   EDW: 77 kg (prior to wt loss?)  Admission weight: 57.4 kg  Current weight: 61.2 kg  I/O: -4,945 ml since 7/29 Last HD yesterday: 2990 ml net UF  Medications: aranesp, hectorol, ferrous sulfate, rena-vit Labs: Na 126 (L) K 5.2 (H) CBG 105-206  Diet Order:   Diet Order            Diet renal/carb modified with fluid restriction Diet-HS Snack? Nothing; Fluid restriction: 1200 mL Fluid; Room service appropriate? Yes; Fluid consistency:  Thin  Diet effective now              EDUCATION NEEDS:   Not appropriate for education at this time  Skin:  Skin Assessment: Skin Integrity Issues: Skin Integrity Issues:: Stage II Stage II: right buttocks  Last BM:  8/11  Height:   Ht Readings from Last 1 Encounters:  01/30/19 5\' 11"  (1.803 m)    Weight:   Wt Readings from Last 1 Encounters:  02/17/19 61.2 kg    Ideal Body Weight:  78.2 kg  BMI:  Body mass index is 18.82 kg/m.  Estimated Nutritional Needs:   Kcal:  1800-2000  Protein:  80-95 grams  Fluid:  >/= 1.8 L   Mariana Single RD, LDN Clinical Nutrition Pager # [phone]

## 2019-02-17 NOTE — Progress Notes (Signed)
PT Cancellation Note  Patient Details Name: Cole Nelson MRN: [id number] DOB: March 19, 1956   Cancelled Treatment:    Reason Eval/Treat Not Completed: Medical issues which prohibited therapy.  Pt in bed with blanket covering his head, and declined PT over his abd discomfort.  Follow up as time and pt allow.   Ramond Dial 02/17/2019, 11:59 AM   Mee Hives, PT MS Acute Rehab Dept. Number: [place]

## 2019-02-17 NOTE — Progress Notes (Signed)
Patient is complaining of his stomach bothering him I gave him zofran for the nausea and he states that it is still bothering him. He refused his morning medications will try again later MD was notify

## 2019-02-17 NOTE — TOC Transition Note (Addendum)
Transition of Care [facility]) - CM/SW Discharge Note *Discharged to [facility] via ambulance   Patient Details  Name: Cole Nelson MRN: [id number] Date of Birth: May 18, 1956  [facility]) CM/SW Contact:  Sable Feil, LCSW Phone Number: 02/17/2019, 2:08 PM   Clinical Narrative: Wife agreeable to [facility] in [place] for her husband and facility able to accept patient today. Patient will go to room 99991111 1 at [facility]. [name] will be transported by non-emergency transport.  Call made to [employer] (manages patient's [facility]) and provided information for discharge facility. Approval information: [id number], Navi reference [id number], eff. Date 8/12 and next review date 8/14. Facility will send clinicals to Dollene Cleveland - fax [phone].  [name] dialysis was changed from [facility] to [facility] by Dialysis Coordinator Terri Piedra. [name] HD schedule is  TTS 11:30 chair time and arrive by 10:40 am. Patient's 1st HD treatment at this site will be Thursday, 8/13 and he will arrive at facility at 10 am to complete paperwork before dialysis treatment. [facility]'s admission's director Irven Shelling was provided with HD schedule.  CSW was provided with insurance authorization information on 8/11 and approval date changed to 02/17/19: [id number] Balls referral number [id number]; next review date 8/14. This information was provided to admissions director Debbie at [facility].   Final next level of care: [place], [place]) Barriers to Discharge: No Barriers Identified   Patient Goals and CMS Choice Patient states their goals for this hospitalization and ongoing recovery are:: Wife wants patient to get stronger so that he an return home CMS Medicare.gov Compare Post Acute Care list provided to:: Other (Comment Required)(Wife provided CSW with [facility] facility preferences and  was informed of SNF's in [place], [place]) Choice offered to / list presented to : Spouse(Talked wth wife regarding facility choices)  Discharge Placement PASRR number recieved: 02/16/19([phone])            Patient chooses bed at: [facility](Facility now [facility]) Patient to be transferred to facility by: Ambulance Name of family member notified: [name] Shamburg - wife by phone - [phone] (Cell); [phone] (home) Patient and family notified of of transfer: 02/17/19  Discharge Plan and Services                                    Social Determinants of Health (SDOH) Interventions  No SDOH interventions needed at this time   Readmission Risk Interventions No flowsheet data found.

## 2019-02-17 NOTE — Progress Notes (Signed)
Renal Navigator updated by CSW/C. Crawford of [place] + situation at [facility], which prevented patient's discharge to this center yesterday. Renal Navigator spoke with [facility] clinic who is aware of the situation. Renal Navigator was notified by CSW/V. Crawford that patient has been accepted at [facility] in [place] and asked Renal Navigator to get OP HD clinic switched to [place]. Renal Navigator left message with [facility] Ad[facility]/Carrie and spoke with [facility] OP HD clinic. Nira Conn states patient can be assigned a TTS schedule with a seat time of 11:00am. He will have to arrive tomorrow, 02/18/19 at 10:00am to complete intake paperwork prior to first treatment. CSW aware and will notify SNF. Renal Navigator updated Nephrologist/Dr. Marval Regal.  Alphonzo Cruise, [place] Renal Navigator [phone]

## 2019-02-17 NOTE — Plan of Care (Signed)
  Problem: Health Behavior/Discharge Planning: Goal: Ability to manage health-related needs will improve Outcome: Progressing   

## 2019-02-20 ENCOUNTER — Encounter (HOSPITAL_COMMUNITY): Payer: Self-pay | Admitting: Emergency Medicine

## 2019-02-20 ENCOUNTER — Emergency Department: Payer: Self-pay

## 2019-02-20 ENCOUNTER — Inpatient Hospital Stay (HOSPITAL_COMMUNITY)
Admission: EM | Admit: 2019-02-20 | Discharge: 2019-03-01 | DRG: 208 | Disposition: A | Discharge status: Hospice | Payer: Medicare PPO | Source: Skilled Nursing Facility | Attending: Internal Medicine | Admitting: Internal Medicine

## 2019-02-20 ENCOUNTER — Inpatient Hospital Stay (HOSPITAL_COMMUNITY): Payer: Medicare PPO

## 2019-02-20 ENCOUNTER — Emergency Department (HOSPITAL_COMMUNITY): Payer: Medicare PPO

## 2019-02-20 ENCOUNTER — Other Ambulatory Visit: Payer: Self-pay

## 2019-02-20 DIAGNOSIS — N186 End stage renal disease: Secondary | ICD-10-CM | POA: Diagnosis present

## 2019-02-20 DIAGNOSIS — F419 Anxiety disorder, unspecified: Secondary | ICD-10-CM | POA: Diagnosis present

## 2019-02-20 DIAGNOSIS — Z515 Encounter for palliative care: Secondary | ICD-10-CM | POA: Diagnosis present

## 2019-02-20 DIAGNOSIS — I10 Essential (primary) hypertension: Secondary | ICD-10-CM | POA: Diagnosis not present

## 2019-02-20 DIAGNOSIS — I959 Hypotension, unspecified: Secondary | ICD-10-CM | POA: Diagnosis not present

## 2019-02-20 DIAGNOSIS — Z4659 Encounter for fitting and adjustment of other gastrointestinal appliance and device: Secondary | ICD-10-CM

## 2019-02-20 DIAGNOSIS — R627 Adult failure to thrive: Secondary | ICD-10-CM | POA: Diagnosis not present

## 2019-02-20 DIAGNOSIS — E785 Hyperlipidemia, unspecified: Secondary | ICD-10-CM | POA: Diagnosis present

## 2019-02-20 DIAGNOSIS — Z9889 Other specified postprocedural states: Secondary | ICD-10-CM

## 2019-02-20 DIAGNOSIS — J90 Pleural effusion, not elsewhere classified: Secondary | ICD-10-CM | POA: Diagnosis present

## 2019-02-20 DIAGNOSIS — J948 Other specified pleural conditions: Secondary | ICD-10-CM | POA: Diagnosis present

## 2019-02-20 DIAGNOSIS — J9600 Acute respiratory failure, unspecified whether with hypoxia or hypercapnia: Secondary | ICD-10-CM

## 2019-02-20 DIAGNOSIS — R4702 Dysphasia: Secondary | ICD-10-CM | POA: Diagnosis present

## 2019-02-20 DIAGNOSIS — E1143 Type 2 diabetes mellitus with diabetic autonomic (poly)neuropathy: Secondary | ICD-10-CM | POA: Diagnosis present

## 2019-02-20 DIAGNOSIS — I69354 Hemiplegia and hemiparesis following cerebral infarction affecting left non-dominant side: Secondary | ICD-10-CM

## 2019-02-20 DIAGNOSIS — D631 Anemia in chronic kidney disease: Secondary | ICD-10-CM | POA: Diagnosis present

## 2019-02-20 DIAGNOSIS — E11649 Type 2 diabetes mellitus with hypoglycemia without coma: Secondary | ICD-10-CM | POA: Diagnosis present

## 2019-02-20 DIAGNOSIS — G249 Dystonia, unspecified: Secondary | ICD-10-CM | POA: Diagnosis not present

## 2019-02-20 DIAGNOSIS — E162 Hypoglycemia, unspecified: Secondary | ICD-10-CM

## 2019-02-20 DIAGNOSIS — Y95 Nosocomial condition: Secondary | ICD-10-CM | POA: Diagnosis present

## 2019-02-20 DIAGNOSIS — J45909 Unspecified asthma, uncomplicated: Secondary | ICD-10-CM | POA: Diagnosis present

## 2019-02-20 DIAGNOSIS — J189 Pneumonia, unspecified organism: Secondary | ICD-10-CM | POA: Diagnosis not present

## 2019-02-20 DIAGNOSIS — J9601 Acute respiratory failure with hypoxia: Secondary | ICD-10-CM | POA: Diagnosis present

## 2019-02-20 DIAGNOSIS — Z452 Encounter for adjustment and management of vascular access device: Secondary | ICD-10-CM

## 2019-02-20 DIAGNOSIS — E876 Hypokalemia: Secondary | ICD-10-CM | POA: Diagnosis not present

## 2019-02-20 DIAGNOSIS — R4182 Altered mental status, unspecified: Secondary | ICD-10-CM | POA: Diagnosis present

## 2019-02-20 DIAGNOSIS — J939 Pneumothorax, unspecified: Secondary | ICD-10-CM | POA: Diagnosis not present

## 2019-02-20 DIAGNOSIS — G35 Multiple sclerosis: Secondary | ICD-10-CM

## 2019-02-20 DIAGNOSIS — L89312 Pressure ulcer of right buttock, stage 2: Secondary | ICD-10-CM | POA: Diagnosis present

## 2019-02-20 DIAGNOSIS — I132 Hypertensive heart and chronic kidney disease with heart failure and with stage 5 chronic kidney disease, or end stage renal disease: Secondary | ICD-10-CM | POA: Diagnosis present

## 2019-02-20 DIAGNOSIS — Z9981 Dependence on supplemental oxygen: Secondary | ICD-10-CM

## 2019-02-20 DIAGNOSIS — Z20828 Contact with and (suspected) exposure to other viral communicable diseases: Secondary | ICD-10-CM | POA: Diagnosis present

## 2019-02-20 DIAGNOSIS — E43 Unspecified severe protein-calorie malnutrition: Secondary | ICD-10-CM | POA: Diagnosis present

## 2019-02-20 DIAGNOSIS — Z880 Allergy status to penicillin: Secondary | ICD-10-CM

## 2019-02-20 DIAGNOSIS — Z66 Do not resuscitate: Secondary | ICD-10-CM | POA: Diagnosis not present

## 2019-02-20 DIAGNOSIS — G9341 Metabolic encephalopathy: Secondary | ICD-10-CM | POA: Diagnosis present

## 2019-02-20 DIAGNOSIS — Z888 Allergy status to other drugs, medicaments and biological substances status: Secondary | ICD-10-CM

## 2019-02-20 DIAGNOSIS — G255 Other chorea: Secondary | ICD-10-CM | POA: Diagnosis present

## 2019-02-20 DIAGNOSIS — R0902 Hypoxemia: Secondary | ICD-10-CM

## 2019-02-20 DIAGNOSIS — G244 Idiopathic orofacial dystonia: Secondary | ICD-10-CM | POA: Diagnosis present

## 2019-02-20 DIAGNOSIS — Z992 Dependence on renal dialysis: Secondary | ICD-10-CM

## 2019-02-20 DIAGNOSIS — Z87891 Personal history of nicotine dependence: Secondary | ICD-10-CM

## 2019-02-20 DIAGNOSIS — E1122 Type 2 diabetes mellitus with diabetic chronic kidney disease: Secondary | ICD-10-CM | POA: Diagnosis present

## 2019-02-20 DIAGNOSIS — J690 Pneumonitis due to inhalation of food and vomit: Secondary | ICD-10-CM | POA: Diagnosis present

## 2019-02-20 DIAGNOSIS — Z7401 Bed confinement status: Secondary | ICD-10-CM

## 2019-02-20 DIAGNOSIS — J9690 Respiratory failure, unspecified, unspecified whether with hypoxia or hypercapnia: Secondary | ICD-10-CM

## 2019-02-20 DIAGNOSIS — I5032 Chronic diastolic (congestive) heart failure: Secondary | ICD-10-CM | POA: Diagnosis present

## 2019-02-20 DIAGNOSIS — Z9289 Personal history of other medical treatment: Secondary | ICD-10-CM

## 2019-02-20 DIAGNOSIS — R400 Somnolence: Secondary | ICD-10-CM | POA: Diagnosis not present

## 2019-02-20 DIAGNOSIS — R579 Shock, unspecified: Secondary | ICD-10-CM | POA: Diagnosis not present

## 2019-02-20 DIAGNOSIS — Z9689 Presence of other specified functional implants: Secondary | ICD-10-CM | POA: Diagnosis present

## 2019-02-20 DIAGNOSIS — Z79899 Other long term (current) drug therapy: Secondary | ICD-10-CM

## 2019-02-20 DIAGNOSIS — G9340 Encephalopathy, unspecified: Secondary | ICD-10-CM | POA: Diagnosis not present

## 2019-02-20 DIAGNOSIS — J9691 Respiratory failure, unspecified with hypoxia: Secondary | ICD-10-CM | POA: Diagnosis present

## 2019-02-20 DIAGNOSIS — K3184 Gastroparesis: Secondary | ICD-10-CM | POA: Diagnosis present

## 2019-02-20 DIAGNOSIS — Z0189 Encounter for other specified special examinations: Secondary | ICD-10-CM

## 2019-02-20 DIAGNOSIS — Z7982 Long term (current) use of aspirin: Secondary | ICD-10-CM

## 2019-02-20 DIAGNOSIS — Z7189 Other specified counseling: Secondary | ICD-10-CM | POA: Diagnosis not present

## 2019-02-20 DIAGNOSIS — T17918A Gastric contents in respiratory tract, part unspecified causing other injury, initial encounter: Secondary | ICD-10-CM | POA: Diagnosis present

## 2019-02-20 LAB — BLOOD GAS, ARTERIAL
Acid-Base Excess: 2.9 mmol/L — ABNORMAL HIGH (ref 0.0–2.0)
Bicarbonate: 26.5 mmol/L (ref 20.0–28.0)
Drawn by: 234301
FIO2: 100
O2 Saturation: 100 %
Patient temperature: 37
pCO2 arterial: 52.3 mmHg — ABNORMAL HIGH (ref 32.0–48.0)
pH, Arterial: 7.348 — ABNORMAL LOW (ref 7.350–7.450)
pO2, Arterial: 245 mmHg — ABNORMAL HIGH (ref 83.0–108.0)

## 2019-02-20 LAB — COMPREHENSIVE METABOLIC PANEL WITH GFR
ALT: 11 U/L (ref 0–44)
AST: 24 U/L (ref 15–41)
Albumin: 3.1 g/dL — ABNORMAL LOW (ref 3.5–5.0)
Alkaline Phosphatase: 98 U/L (ref 38–126)
Anion gap: 20 — ABNORMAL HIGH (ref 5–15)
BUN: 35 mg/dL — ABNORMAL HIGH (ref 8–23)
CO2: 22 mmol/L (ref 22–32)
Calcium: 9.8 mg/dL (ref 8.9–10.3)
Chloride: 93 mmol/L — ABNORMAL LOW (ref 98–111)
Creatinine, Ser: 6.96 mg/dL — ABNORMAL HIGH (ref 0.61–1.24)
GFR calc Af Amer: 9 mL/min — ABNORMAL LOW
GFR calc non Af Amer: 8 mL/min — ABNORMAL LOW
Glucose, Bld: 84 mg/dL (ref 70–99)
Potassium: 3.9 mmol/L (ref 3.5–5.1)
Sodium: 135 mmol/L (ref 135–145)
Total Bilirubin: 0.6 mg/dL (ref 0.3–1.2)
Total Protein: 8 g/dL (ref 6.5–8.1)

## 2019-02-20 LAB — CBC WITH DIFFERENTIAL/PLATELET
Abs Immature Granulocytes: 0.03 10*3/uL (ref 0.00–0.07)
Abs Immature Granulocytes: 0.06 10*3/uL (ref 0.00–0.07)
Basophils Absolute: 0.1 10*3/uL (ref 0.0–0.1)
Basophils Absolute: 0.1 10*3/uL (ref 0.0–0.1)
Basophils Relative: 1 %
Basophils Relative: 0 %
Eosinophils Absolute: 0.1 10*3/uL (ref 0.0–0.5)
Eosinophils Absolute: 0 10*3/uL (ref 0.0–0.5)
Eosinophils Relative: 1 %
Eosinophils Relative: 0 %
HCT: 42.1 % (ref 39.0–52.0)
HCT: 39.3 % (ref 39.0–52.0)
Hemoglobin: 12.5 g/dL — ABNORMAL LOW (ref 13.0–17.0)
Hemoglobin: 12.4 g/dL — ABNORMAL LOW (ref 13.0–17.0)
Immature Granulocytes: 0 %
Immature Granulocytes: 1 %
Lymphocytes Relative: 4 %
Lymphocytes Relative: 2 %
Lymphs Abs: 0.4 10*3/uL — ABNORMAL LOW (ref 0.7–4.0)
Lymphs Abs: 0.3 10*3/uL — ABNORMAL LOW (ref 0.7–4.0)
MCH: 24.7 pg — ABNORMAL LOW (ref 26.0–34.0)
MCH: 25.9 pg — ABNORMAL LOW (ref 26.0–34.0)
MCHC: 29.7 g/dL — ABNORMAL LOW (ref 30.0–36.0)
MCHC: 31.6 g/dL (ref 30.0–36.0)
MCV: 83 fL (ref 80.0–100.0)
MCV: 82.2 fL (ref 80.0–100.0)
Monocytes Absolute: 0.3 10*3/uL (ref 0.1–1.0)
Monocytes Absolute: 0.6 10*3/uL (ref 0.1–1.0)
Monocytes Relative: 3 %
Monocytes Relative: 5 %
Neutro Abs: 9.2 10*3/uL — ABNORMAL HIGH (ref 1.7–7.7)
Neutro Abs: 11.3 10*3/uL — ABNORMAL HIGH (ref 1.7–7.7)
Neutrophils Relative %: 91 %
Neutrophils Relative %: 92 %
Platelets: 335 10*3/uL (ref 150–400)
Platelets: 226 10*3/uL (ref 150–400)
RBC: 5.07 MIL/uL (ref 4.22–5.81)
RBC: 4.78 MIL/uL (ref 4.22–5.81)
RDW: 15.5 % (ref 11.5–15.5)
RDW: 14.8 % (ref 11.5–15.5)
WBC: 10.1 10*3/uL (ref 4.0–10.5)
WBC: 12.3 10*3/uL — ABNORMAL HIGH (ref 4.0–10.5)
nRBC: 0 % (ref 0.0–0.2)
nRBC: 0.3 % — ABNORMAL HIGH (ref 0.0–0.2)

## 2019-02-20 LAB — POCT I-STAT 7, (LYTES, BLD GAS, ICA,H+H)
Acid-Base Excess: 4 mmol/L — ABNORMAL HIGH (ref 0.0–2.0)
Bicarbonate: 29.3 mmol/L — ABNORMAL HIGH (ref 20.0–28.0)
Calcium, Ion: 1.17 mmol/L (ref 1.15–1.40)
HCT: 33 % — ABNORMAL LOW (ref 39.0–52.0)
Hemoglobin: 11.2 g/dL — ABNORMAL LOW (ref 13.0–17.0)
O2 Saturation: 100 %
Patient temperature: 37.8
Potassium: 3.5 mmol/L (ref 3.5–5.1)
Sodium: 133 mmol/L — ABNORMAL LOW (ref 135–145)
TCO2: 31 mmol/L (ref 22–32)
pCO2 arterial: 48.4 mmHg — ABNORMAL HIGH (ref 32.0–48.0)
pH, Arterial: 7.394 (ref 7.350–7.450)
pO2, Arterial: 254 mmHg — ABNORMAL HIGH (ref 83.0–108.0)

## 2019-02-20 LAB — PROTIME-INR
INR: 1 (ref 0.8–1.2)
Prothrombin Time: 13.4 s (ref 11.4–15.2)

## 2019-02-20 LAB — COMPREHENSIVE METABOLIC PANEL
ALT: 12 U/L (ref 0–44)
AST: 24 U/L (ref 15–41)
Albumin: 3.7 g/dL (ref 3.5–5.0)
Alkaline Phosphatase: 121 U/L (ref 38–126)
Anion gap: 17 — ABNORMAL HIGH (ref 5–15)
BUN: 30 mg/dL — ABNORMAL HIGH (ref 8–23)
CO2: 25 mmol/L (ref 22–32)
Calcium: 10 mg/dL (ref 8.9–10.3)
Chloride: 93 mmol/L — ABNORMAL LOW (ref 98–111)
Creatinine, Ser: 6.43 mg/dL — ABNORMAL HIGH (ref 0.61–1.24)
GFR calc Af Amer: 10 mL/min — ABNORMAL LOW (ref >60)
GFR calc non Af Amer: 8 mL/min — ABNORMAL LOW (ref >60)
Glucose, Bld: 56 mg/dL — ABNORMAL LOW (ref 70–99)
Potassium: 3.9 mmol/L (ref 3.5–5.1)
Sodium: 135 mmol/L (ref 135–145)
Total Bilirubin: 0.4 mg/dL (ref 0.3–1.2)
Total Protein: 9.2 g/dL — ABNORMAL HIGH (ref 6.5–8.1)

## 2019-02-20 LAB — GLUCOSE, CAPILLARY
Glucose-Capillary: 103 mg/dL — ABNORMAL HIGH (ref 70–99)
Glucose-Capillary: 108 mg/dL — ABNORMAL HIGH (ref 70–99)

## 2019-02-20 LAB — BRAIN NATRIURETIC PEPTIDE
B Natriuretic Peptide: 478 pg/mL — ABNORMAL HIGH (ref 0.0–100.0)
B Natriuretic Peptide: 783.7 pg/mL — ABNORMAL HIGH (ref 0.0–100.0)

## 2019-02-20 LAB — CBG MONITORING, ED
Glucose-Capillary: 29 mg/dL — CL (ref 70–99)
Glucose-Capillary: 32 mg/dL — CL (ref 70–99)
Glucose-Capillary: 174 mg/dL — ABNORMAL HIGH (ref 70–99)
Glucose-Capillary: 125 mg/dL — ABNORMAL HIGH (ref 70–99)
Glucose-Capillary: 24 mg/dL — CL (ref 70–99)
Glucose-Capillary: 57 mg/dL — ABNORMAL LOW (ref 70–99)

## 2019-02-20 LAB — SARS CORONAVIRUS 2 BY RT PCR (HOSPITAL ORDER, PERFORMED IN ~~LOC~~ HOSPITAL LAB): SARS Coronavirus 2: NEGATIVE

## 2019-02-20 LAB — LACTIC ACID, PLASMA
Lactic Acid, Venous: 1.8 mmol/L (ref 0.5–1.9)
Lactic Acid, Venous: 2.6 mmol/L (ref 0.5–1.9)
Lactic Acid, Venous: 3.2 mmol/L (ref 0.5–1.9)

## 2019-02-20 LAB — LIPASE, BLOOD: Lipase: 33 U/L (ref 11–51)

## 2019-02-20 LAB — MAGNESIUM: Magnesium: 2.2 mg/dL (ref 1.7–2.4)

## 2019-02-20 LAB — PHOSPHORUS: Phosphorus: 5.3 mg/dL — ABNORMAL HIGH (ref 2.5–4.6)

## 2019-02-20 LAB — APTT: aPTT: 39 seconds — ABNORMAL HIGH (ref 24–36)

## 2019-02-20 LAB — MRSA PCR SCREENING: MRSA by PCR: NEGATIVE

## 2019-02-20 MED ORDER — ETOMIDATE 2 MG/ML IV SOLN
20.0000 mg | Freq: Once | INTRAVENOUS | Status: AC
  Administered 2019-02-20: 12:00:00 20 mg via INTRAVENOUS

## 2019-02-20 MED ORDER — SODIUM CHLORIDE 0.9 % IV BOLUS: 1000.0000 mL | Freq: Once | INTRAVENOUS | Status: DC

## 2019-02-20 MED ORDER — STERILE WATER FOR INJECTION IJ SOLN
INTRAMUSCULAR | Status: AC
  Filled 2019-02-20: qty 10

## 2019-02-20 MED ORDER — VANCOMYCIN HCL 500 MG IV SOLR: 500.0000 mg | INTRAVENOUS | Status: DC

## 2019-02-20 MED ORDER — DEXTROSE 50 % IV SOLN
50.0000 mL | Freq: Once | INTRAVENOUS | Status: AC
  Administered 2019-02-20: 50 mL via INTRAVENOUS

## 2019-02-20 MED ORDER — FENTANYL 2500MCG IN NS 250ML (10MCG/ML) PREMIX INFUSION
25.0000 ug/h | INTRAVENOUS | Status: DC
  Administered 2019-02-20: 100 ug/h via INTRAVENOUS
  Filled 2019-02-20 (×2): qty 250

## 2019-02-20 MED ORDER — MIDAZOLAM HCL 2 MG/2ML IJ SOLN
2.0000 mg | INTRAMUSCULAR | Status: DC | PRN
  Administered 2019-02-21 – 2019-02-23 (×5): 2 mg via INTRAVENOUS
  Filled 2019-02-20 (×3): qty 2

## 2019-02-20 MED ORDER — SODIUM CHLORIDE 0.9 % IV BOLUS
500.0000 mL | Freq: Once | INTRAVENOUS | Status: AC
  Administered 2019-02-20: 500 mL via INTRAVENOUS

## 2019-02-20 MED ORDER — ENOXAPARIN SODIUM 30 MG/0.3ML ~~LOC~~ SOLN
30.0000 mg | SUBCUTANEOUS | Status: DC
  Administered 2019-02-20 – 2019-02-27 (×8): 30 mg via SUBCUTANEOUS
  Filled 2019-02-20 (×8): qty 0.3

## 2019-02-20 MED ORDER — FENTANYL BOLUS VIA INFUSION
25.0000 ug | INTRAVENOUS | Status: DC | PRN
  Administered 2019-02-21: 25 ug via INTRAVENOUS
  Filled 2019-02-20: qty 25

## 2019-02-20 MED ORDER — FAMOTIDINE 20 MG PO TABS
20.0000 mg | ORAL_TABLET | Freq: Every day | ORAL | Status: DC
  Administered 2019-02-20: 20 mg via ORAL
  Filled 2019-02-20: qty 1

## 2019-02-20 MED ORDER — GLUCAGON HCL RDNA (DIAGNOSTIC) 1 MG IJ SOLR
1.0000 mg | Freq: Once | INTRAMUSCULAR | Status: AC
  Administered 2019-02-20: 08:00:00 1 mg via INTRAMUSCULAR

## 2019-02-20 MED ORDER — PROPOFOL 1000 MG/100ML IV EMUL
INTRAVENOUS | Status: AC
  Administered 2019-02-20: 5 ug/kg/min via INTRAVENOUS
  Filled 2019-02-20: qty 100

## 2019-02-20 MED ORDER — MIDAZOLAM HCL 2 MG/2ML IJ SOLN
2.0000 mg | INTRAMUSCULAR | Status: AC | PRN
  Administered 2019-02-23 – 2019-02-24 (×3): 2 mg via INTRAVENOUS
  Filled 2019-02-20 (×4): qty 2

## 2019-02-20 MED ORDER — DEXTROSE 50 % IV SOLN
INTRAVENOUS | Status: AC
  Filled 2019-02-20: qty 50

## 2019-02-20 MED ORDER — SODIUM CHLORIDE 0.9 % IV SOLN
2.0000 g | Freq: Once | INTRAVENOUS | Status: AC
  Administered 2019-02-20: 10:00:00 2 g via INTRAVENOUS
  Filled 2019-02-20: qty 2

## 2019-02-20 MED ORDER — GLUCOSE 40 % PO GEL
1.0000 | Freq: Once | ORAL | Status: AC
  Administered 2019-02-20: 09:00:00 37.5 g via ORAL

## 2019-02-20 MED ORDER — DEXTROSE-NACL 5-0.45 % IV SOLN
INTRAVENOUS | Status: DC
  Administered 2019-02-20: 13:00:00 via INTRAVENOUS

## 2019-02-20 MED ORDER — GLUCOSE 40 % PO GEL
ORAL | Status: AC
  Administered 2019-02-20: 09:00:00 37.5 g via ORAL
  Filled 2019-02-20: qty 1

## 2019-02-20 MED ORDER — PIPERACILLIN-TAZOBACTAM 3.375 G IVPB
3.3750 g | Freq: Two times a day (BID) | INTRAVENOUS | Status: DC
  Administered 2019-02-20: 3.375 g via INTRAVENOUS
  Filled 2019-02-20 (×2): qty 50

## 2019-02-20 MED ORDER — DEXTROSE-NACL 5-0.45 % IV SOLN
INTRAVENOUS | Status: AC
  Administered 2019-02-20: 14:00:00 via INTRAVENOUS

## 2019-02-20 MED ORDER — SUCCINYLCHOLINE CHLORIDE 20 MG/ML IJ SOLN
100.0000 mg | Freq: Once | INTRAMUSCULAR | Status: AC
  Administered 2019-02-20: 12:00:00 100 mg via INTRAVENOUS

## 2019-02-20 MED ORDER — GLUCAGON HCL RDNA (DIAGNOSTIC) 1 MG IJ SOLR
INTRAMUSCULAR | Status: AC
  Administered 2019-02-20: 08:00:00 1 mg via INTRAMUSCULAR
  Filled 2019-02-20: qty 1

## 2019-02-20 MED ORDER — ASPIRIN 81 MG PO CHEW
81.0000 mg | CHEWABLE_TABLET | Freq: Every day | ORAL | Status: DC
  Administered 2019-02-21 – 2019-02-26 (×6): 81 mg via ORAL
  Filled 2019-02-20 (×6): qty 1

## 2019-02-20 MED ORDER — PRO-STAT SUGAR FREE PO LIQD
30.0000 mL | Freq: Two times a day (BID) | ORAL | Status: DC
  Administered 2019-02-20 – 2019-02-28 (×16): 30 mL
  Filled 2019-02-20 (×16): qty 30

## 2019-02-20 MED ORDER — PROPOFOL 1000 MG/100ML IV EMUL
5.0000 ug/kg/min | INTRAVENOUS | Status: DC
  Administered 2019-02-20: 12:00:00 5 ug/kg/min via INTRAVENOUS
  Administered 2019-02-20: 40 ug/kg/min via INTRAVENOUS
  Filled 2019-02-20: qty 100

## 2019-02-20 MED ORDER — DEXTROSE 5 % IV SOLN
0.5000 g | Freq: Two times a day (BID) | INTRAVENOUS | Status: DC
  Filled 2019-02-20 (×4): qty 0.5

## 2019-02-20 MED ORDER — CHLORHEXIDINE GLUCONATE CLOTH 2 % EX PADS
6.0000 | MEDICATED_PAD | Freq: Every day | CUTANEOUS | Status: DC
  Administered 2019-02-20 – 2019-02-28 (×7): 6 via TOPICAL

## 2019-02-20 MED ORDER — DEXTROSE-NACL 5-0.9 % IV SOLN
INTRAVENOUS | Status: DC
  Administered 2019-02-20: 10:00:00 via INTRAVENOUS

## 2019-02-20 MED ORDER — DEXTROSE 50 % IV SOLN
INTRAVENOUS | Status: AC
  Administered 2019-02-20: 09:00:00 50 mL via INTRAVENOUS
  Filled 2019-02-20: qty 50

## 2019-02-20 MED ORDER — VANCOMYCIN HCL IN DEXTROSE 1-5 GM/200ML-% IV SOLN
1000.0000 mg | Freq: Once | INTRAVENOUS | Status: AC
  Administered 2019-02-20: 1000 mg via INTRAVENOUS
  Filled 2019-02-20: qty 200

## 2019-02-20 MED ORDER — DEXTROSE 50 % IV SOLN
INTRAVENOUS | Status: AC
  Administered 2019-02-20: 14:00:00 50 mL via INTRAVENOUS
  Filled 2019-02-20: qty 50

## 2019-02-20 MED ORDER — DEXTROSE 50 % IV SOLN
1.0000 | Freq: Once | INTRAVENOUS | Status: AC
  Administered 2019-02-20: 09:00:00 50 mL via INTRAVENOUS

## 2019-02-20 MED ORDER — DEXTROSE 10 % IV SOLN
INTRAVENOUS | Status: DC
  Administered 2019-02-20 – 2019-02-22 (×3): via INTRAVENOUS

## 2019-02-20 MED ORDER — IPRATROPIUM-ALBUTEROL 0.5-2.5 (3) MG/3ML IN SOLN: 3.0000 mL | RESPIRATORY_TRACT | Status: DC | PRN

## 2019-02-20 MED ORDER — VITAL HIGH PROTEIN PO LIQD: 1000.0000 mL | ORAL | Status: DC

## 2019-02-20 MED ORDER — FENTANYL CITRATE (PF) 100 MCG/2ML IJ SOLN
25.0000 ug | Freq: Once | INTRAMUSCULAR | Status: AC
  Administered 2019-02-20: 25 ug via INTRAVENOUS

## 2019-02-20 MED ORDER — SODIUM CHLORIDE 0.9 % IV SOLN: INTRAVENOUS | Status: DC

## 2019-02-20 NOTE — Progress Notes (Signed)
Pharmacy Antibiotic Note  Cole Nelson is a 63 y.o. male with ESRD admitted on 02/20/2019 with HCAP pneumonia.  Pharmacy has been consulted for vancomycin and aztreonam dosing. It doesn't appear that the patient has used cephalosporins in the past, and both an allergy to cefepime and to penicillin were noted in his medical record from [place].  ADDENDUM: Allergy documentation unclear, discussed with CCM who would like to proceed with Zosyn at this time.  Plan: Stop aztreonam Zosyn 3.375g IV EI q12h   Height: 5\' 11"  (180.3 cm) Weight: 133 lb 6.1 oz (60.5 kg) IBW/kg (Calculated) : 75.3  Temp (24hrs), Avg:98.5 F (36.9 C), Min:96.3 F (35.7 C), Max:100 F (37.8 C)  Recent Labs  Lab 02/14/19 0534 02/15/19 0343 02/16/19 0518 02/20/19 0821 02/20/19 0912  WBC 6.3  --  5.2 10.1  --   CREATININE 4.34* 6.37* 8.18* 6.43*  --   LATICACIDVEN  --   --   --   --  1.8    Estimated Creatinine Clearance: 10.2 mL/min (A) (by C-G formula based on SCr of 6.43 mg/dL (H)).    Allergies  Allergen Reactions  . Baclofen     confusion  . Cefepime     Confusion, hives   . Insulins     Patient states his mind started going in and out.   Marland Kitchen Penicillins     Antimicrobials this admission: Aztreonam 8/15 x1 Vancomycin 8/15 >> Zosyn 8/15 >>     Microbiology results: 8/15  [facility] x2:   8/15 UCx: 8/15 SARS-CoV-2: neg 7/23 MRSA PCR: neg  Thank you for allowing pharmacy to be a part of this patient's care.   Arrie Senate, PharmD, BCPS Clinical Pharmacist [phone] Please check AMION for all [place] numbers 02/20/2019

## 2019-02-20 NOTE — ED Notes (Signed)
Pt placed on 10L of high flow O2 by RT.

## 2019-02-20 NOTE — ED Notes (Signed)
Lab unable to obtain blood cultures after several attempts. Dr. Rogene Houston gave order to proceed with IV antibiotics.

## 2019-02-20 NOTE — H&P (Signed)
NAME:  Cole Nelson, MRN:  [id number], DOB:  07-01-1956, LOS: 0 ADMISSION DATE:  02/20/2019, CONSULTATION DATE:  02/20/2019 REFERRING MD:  Dr. Rogene Houston, [facility], CHIEF COMPLAINT: Altered mental status  Brief History   63 yo male from [place] SN[facility] with altered mental status and found to have blood sugar of 31.  Had vomiting and aspiration event, and required intubation.  Was in hospital 7/23 to 8/12 with vomiting from gastroparesis.  Past Medical History  ESRD, MS, DM, CVA, Gastroparesis, HTN, Lt pleural effusion s/p VATS January 18, 2019  Significant Hospital Events   8/15 Admit  Consults:  Renal 8/15 ESRD  Procedures:  ETT 8/15 >>  Significant Diagnostic Tests:  CT head 8/15 >> atrophy and chronic ischemic changes  Micro Data:  COVID 8/15 >> negative Blood 8/15 >> Sputum 8/15 >> Urine 8/15 >>   Antimicrobials:  Zosyn 8/15 >>  Vancomycin 8/15 >>   Interim history/subjective:    Objective   Blood pressure (!) 172/65, pulse (!) 103, temperature 99.9 F (37.7 C), resp. rate (!) 21, height 5\' 11"  (1.803 m), weight 60.5 kg, SpO2 96 %.    Vent Mode: PRVC FiO2 (%):  [60 %-100 %] 100 % Set Rate:  [16 bmp-18 bmp] 18 bmp Vt Set:  [600 mL] 600 mL PEEP:  [5 cmH20] 5 cmH20 Plateau Pressure:  [18 cmH20-23 cmH20] 18 cmH20   Intake/Output Summary (Last 24 hours) at 02/20/2019 1729 Last data filed at 02/20/2019 1721 Gross per 24 hour  Intake 1118.07 ml  Output -  Net 1118.07 ml   Filed Weights   02/20/19 0747 02/20/19 1700  Weight: 61.2 kg 60.5 kg    Examination:  General - sedated Eyes - pupils reactive ENT - ETT in place Cardiac - regular rate/rhythm, no murmur Chest - b/l crackles, chest tube suture in Lt chest Abdomen - soft, non tender, + bowel sounds Extremities - decreased muscle bulk Skin - Rt buttock stage 2 pressure ulcer Neuro - withdraws to pain, not following commands  CXR (reviewed by me) - Rt > Lt ASD, loculated Lt basilar hydropneumothorax    Resolved Hospital Problem list     Assessment & Plan:   Acute hypoxic respiratory failure with altered mental status and compromised airway from hypoglycemia complicated by aspiration pneumonitis. Hx of Lt pleural effusion s/p Lt VATS July 2020 now with loculated basilar Lt hydropneumothorax. Hx of asthma. - full vent support - f/u CXR - day 1 of zosyn - prn BDs - f/u CT chest  Acute metabolic encephalopathy with hypoglycemia. Hx of MS, CVA. - RASS goal 0 - D10 IV fluid - monitor CBG - might need further neuro assessment if mental status doesn't improve - continue ASA - hold outpt prozac, neurontin, copaxone  Hx of DM. - hold tradjenta, levemir  ESRD in HD. - consult nephrology  Severe protein calorie malnutrition. - tube feeds  Rt buttock pressure ulcer. - present prior to admission - wound care  Hx of HTN, HLD. - hold outpt coreg, lipitor, catapres, hydralazine, lisinopril  Best practice:  Diet: tube feeds DVT prophylaxis: lovenox GI prophylaxis: Pepcid Mobility: bed rest Code Status: full code Family communication: attempted to contact pt's wife >> no answer at listed numbers Disposition: ICU  Labs   CBC: Recent Labs  Lab 02/14/19 0534 02/16/19 0518 02/20/19 0821  WBC 6.3 5.2 10.1  NEUTROABS 4.2  --  9.2*  HGB 9.2* 8.6* 12.5*  HCT 29.5* 27.2* 42.1  MCV 81.5 81.0 83.0  PLT  256 246 [id number]    Basic Metabolic Panel: Recent Labs  Lab 02/14/19 0534 02/15/19 0343 02/16/19 0518 02/20/19 0821  NA 132* 129* 126* 135  K 4.2 4.6 5.2* 3.9  CL 92* 91* 88* 93*  CO2 27 25 23 25   GLUCOSE 79 126* 123* 56*  BUN 28* 47* 67* 30*  CREATININE 4.34* 6.37* 8.18* 6.43*  CALCIUM 9.2 9.2 9.2 10.0   GFR: Estimated Creatinine Clearance: 10.2 mL/min (A) (by C-G formula based on SCr of 6.43 mg/dL (H)). Recent Labs  Lab 02/14/19 0534 02/16/19 0518 02/20/19 0821 02/20/19 0912  WBC 6.3 5.2 10.1  --   LATICACIDVEN  --   --   --  1.8    Liver Function Tests:  Recent Labs  Lab 02/20/19 0821  AST 24  ALT 12  ALKPHOS 121  BILITOT 0.4  PROT 9.2*  ALBUMIN 3.7   Recent Labs  Lab 02/20/19 0821  LIPASE 33   No results for input(s): AMMONIA in the last 168 hours.  ABG    Component Value Date/Time   PHART 7.348 (L) 02/20/2019 1410   PCO2ART 52.3 (H) 02/20/2019 1410   PO2ART 245 (H) 02/20/2019 1410   HCO3 26.5 02/20/2019 1410   O2SAT 100.0 02/20/2019 1410     Coagulation Profile: No results for input(s): INR, PROTIME in the last 168 hours.  Cardiac Enzymes: No results for input(s): CKTOTAL, CKMB, CKMBINDEX, TROPONINI in the last 168 hours.  HbA1C: No results found for: HGBA1C  CBG: Recent Labs  Lab 02/20/19 0822 02/20/19 0850 02/20/19 1345 02/20/19 1407 02/20/19 1547  GLUCAP 32* 174* 24* 125* 57*    Review of Systems:   Unable to obtain  Past Medical History  He,  has a past medical history of Anemia, CHF (congestive heart failure) ([place]), CVA (cerebral vascular accident) ([place]), Diabetes mellitus without complication ([place]), ESRD (end stage renal disease) on dialysis ([place]), Gastroparesis due to DM ([place]), Multiple sclerosis ([place]), Recurrent left pleural effusion, and Tobacco abuse.   Surgical History    Past Surgical History:  Procedure Laterality Date  . BIOPSY  01/31/2019   Procedure: BIOPSY;  Surgeon: Jerene Bears, MD;  Location: [facility] ENDOSCOPY;  Service: Gastroenterology;;  . ENTEROSCOPY N/A 01/31/2019   Procedure: ENTEROSCOPY;  Surgeon: Jerene Bears, MD;  Location: [place];  Service: Gastroenterology;  Laterality: N/A;  . VIDEO ASSISTED THORACOSCOPY (VATS)/DECORTICATION       Social History   reports previous alcohol use. He reports previous drug use.   Family History   His family history is not on file.   Allergies Allergies  Allergen Reactions  . Baclofen     confusion  . Cefepime     Confusion, hives   . Insulins     Patient states his mind started going in and out.   Marland Kitchen Penicillins      Home  Medications  Prior to Admission medications   Medication Sig Start Date End Date Taking? Authorizing Provider  amLODipine (NORVASC) 5 MG tablet Take 5 mg by mouth 2 (two) times a day. 12/24/18  Yes [provider]  aspirin 81 MG chewable tablet Chew 81 mg by mouth daily.   Yes [provider]  atorvastatin (LIPITOR) 20 MG tablet Take 20 mg by mouth daily.   Yes [provider]  b complex-vitamin c-folic acid (NEPHRO-VITE) 0.8 MG TABS tablet Take 1 tablet by mouth daily.   Yes [provider]  carvedilol (COREG) 25 MG tablet Take 25 mg by mouth  2 (two) times a day. 08/15/17  Yes [provider]  cholecalciferol (VITAMIN D) 25 MCG (1000 UT) tablet Take 25 mcg by mouth daily.   Yes [provider]  cloNIDine (CATAPRES) 0.3 MG tablet Take 0.3 mg by mouth 2 (two) times a day.   Yes [provider]  COPAXONE 40 MG/ML SOSY Inject 40 mg into the skin See admin instructions. Inject 40mg  one time per day every Monday, Wednesday, and Friday 01/14/19  Yes [provider]  ferrous sulfate 325 (65 FE) MG tablet Take 325 mg by mouth daily.   Yes [provider]  fexofenadine (ALLEGRA) 180 MG tablet Take 180 mg by mouth daily.   Yes [provider]  FLUoxetine (PROZAC) 20 MG capsule Take 20 mg by mouth daily. 08/15/17  Yes [provider]  fluticasone (FLONASE) 50 MCG/ACT nasal spray Place 1 spray into both nostrils as needed for allergies.   Yes [provider]  Fluticasone Furoate (ARNUITY ELLIPTA) 100 MCG/ACT AEPB Inhale 1 puff into the lungs daily.   Yes [provider]  Fluticasone Propionate, Inhal, (FLOVENT DISKUS) 100 MCG/BLIST AEPB Inhale 100 mcg into the lungs 2 (two) times a day.    Yes [provider]  Folic Acid-Vit Q000111Q 123456 0.5-5-0.2 MG TABS Take 1 tablet by mouth daily.   Yes [provider]  gabapentin (NEURONTIN) 100 MG capsule Take 100 mg by mouth 2 (two) times a day.    Yes [provider]  hydrALAZINE (APRESOLINE) 100 MG tablet Take 100 mg by mouth 3 (three) times daily.   Yes [provider]  HYDROcodone-acetaminophen (NORCO/VICODIN) 5-325 MG tablet Take 1 tablet by mouth every 6 (six) hours as needed. 02/16/19  Yes Gherghe, Vella Redhead, MD  Insulin Detemir (LEVEMIR FLEXTOUCH) 100 UNIT/ML Pen Inject 10 Units as directed at bedtime as needed.   Yes [provider]  linagliptin (TRADJENTA) 5 MG TABS tablet Take 5 mg by mouth daily.   Yes [provider]  lisinopril (ZESTRIL) 10 MG tablet Take 1 tablet (10 mg total) by mouth daily. 02/17/19  Yes Gherghe, Vella Redhead, MD  montelukast (SINGULAIR) 10 MG tablet Take 10 mg by mouth daily.   Yes [provider]  omeprazole (PRILOSEC) 20 MG capsule Take 20 mg by mouth daily.   Yes [provider]  ondansetron (ZOFRAN) 4 MG tablet Take 4 mg by mouth every 8 (eight) hours as needed for nausea or vomiting.   Yes [provider]  traMADol (ULTRAM) 50 MG tablet Take 1 tablet (50 mg total) by mouth every 12 (twelve) hours as needed for moderate pain. 02/16/19  Yes Caren Griffins, MD  traZODone (DESYREL) 50 MG tablet Take 50 mg by mouth at bedtime.   Yes [provider]  dicyclomine (BENTYL) 20 MG tablet Take 20 mg by mouth 3 (three) times daily as needed for spasms.    [provider]  ondansetron (ZOFRAN-ODT) 4 MG disintegrating tablet Take 4 mg by mouth every 4 (four) hours as needed for nausea/vomiting.    [provider]     Critical care time: 38 minutes     Chesley Mires, MD [place] 02/20/2019, 5:48 PM

## 2019-02-20 NOTE — ED Notes (Addendum)
Pt vomited large amount of emesis of undigested food.

## 2019-02-20 NOTE — ED Notes (Signed)
Still unable to obtain IV access. EDP at bedside. CBG 32. Order given for oral glucose gel per Dr. Rogene Houston.

## 2019-02-20 NOTE — ED Notes (Signed)
PICC nurse called and reports they cannot insert a PICC line due to pt being on dialysis even with MD permission. Dr. Rogene Houston notified.

## 2019-02-20 NOTE — Progress Notes (Signed)
RT transported pt to and from CT without complications. RT will continue to monitor as needed.

## 2019-02-20 NOTE — Progress Notes (Signed)
Spoke with primary RN Meagan about PICC placement . However this patient is not a candidate for a PICC due to being a renal patient. Recommended  IJ or Subclavian CVC placement if access needed.

## 2019-02-20 NOTE — ED Triage Notes (Signed)
EMS reports they were called to [facility] this am due to pt initial CBG 31 per nursing home and pt was given 2 doses of oral glucose and 1 gram glucagon around 0655 this am. EMS reports last CBG 51 in route.

## 2019-02-20 NOTE — Progress Notes (Signed)
Pharmacy Antibiotic Note  Cole Nelson is a 63 y.o. male with ESRD admitted on 02/20/2019 with HCAP pneumonia.  Pharmacy has been consulted for vancomycin and aztreonam dosing. It doesn't appear that the patient has used cephalosporins in the past, and both an allergy to cefepime and to penicillin were noted in his medical record from [place].  Plan: Start aztreonam 500mg  IV q12h (dialysis dosing) Loading dose:  vancomycin 1g IV x1 dose Maintenance dose:  vancomycin 500g IV after each HD session (Tues-Thurs-Sat) Goal vancomycin  trough range: 15-20   mcg/mL Pharmacy will continue to monitor renal function, vancomycin troughs as clinical indicated, cultures and patient progress.  Height: 5\' 11"  (180.3 cm) Weight: 134 lb 14.7 oz (61.2 kg) IBW/kg (Calculated) : 75.3  Temp (24hrs), Avg:96.6 F (35.9 C), Min:96.3 F (35.7 C), Max:96.8 F (36 C)  Recent Labs  Lab 02/14/19 0534 02/15/19 0343 02/16/19 0518 02/20/19 0821 02/20/19 0912  WBC 6.3  --  5.2 10.1  --   CREATININE 4.34* 6.37* 8.18* 6.43*  --   LATICACIDVEN  --   --   --   --  1.8    Estimated Creatinine Clearance: 10.3 mL/min (A) (by C-G formula based on SCr of 6.43 mg/dL (H)).    Allergies  Allergen Reactions  . Baclofen     confusion  . Cefepime     Confusion, hives   . Insulins     Patient states his mind started going in and out.   Marland Kitchen Penicillins     Antimicrobials this admission: aztreonam 8/15 >>    vancomycin 8/15 >>    Dose adjustments this admission: vancomycin and  aztreonam  Microbiology results: 8/15  [facility] x2:   8/15 UCx: 8/15 SARS-CoV-2: neg 7/23 MRSA PCR: neg  Thank you for allowing pharmacy to be a part of this patient's care.  Despina Pole 02/20/2019 11:07 AM

## 2019-02-20 NOTE — ED Notes (Addendum)
Unable to obtain IV access at this time. EDP notified. Order given for Glucagon IM injection.

## 2019-02-20 NOTE — ED Notes (Addendum)
Pt's CBG 57. [facility] RN asked for 1/2 amp of D50. Medication given to [facility] RN. Then decision was made by [facility] RN to titrate D10 and propofol drip to help increase blood glucose.  Shea Stakes, receiving [facility] RN notified.

## 2019-02-20 NOTE — Consult Note (Signed)
[facility]    NEPHROLOGY CONSULTATION NOTE  PATIENT ID:  Cole Nelson, DOB:  August 11, 1955  HPI: The patient is a 63 y.o. year old male with a past medical history significant for end-stage renal disease on hemodialysis Tuesday, Thursday, and Saturday with his last treatment being on Thursday who presented with altered mental status and hypoglycemia.  He vomited and had an aspiration event, requiring intubation.  He was hospitalized from 7/23-8/12 with vomiting related to gastroparesis.  He presented as a transfer from [facility] this evening.  The patient is intubated and sedated and is unable to provide any other associated history.   Past Medical History:  Diagnosis Date  . Anemia   . CHF (congestive heart failure) ([place])   . CVA (cerebral vascular accident) ([place])   . Diabetes mellitus without complication ([place])   . ESRD (end stage renal disease) on dialysis ([place])   . Gastroparesis due to DM ([place])   . Multiple sclerosis ([place])   . Recurrent left pleural effusion   . Tobacco abuse     Past Surgical History:  Procedure Laterality Date  . BIOPSY  01/31/2019   Procedure: BIOPSY;  Surgeon: Jerene Bears, MD;  Location[facility] ENDOSCOPY;  Service: Gastroenterology;;  . ENTEROSCOPY N/A 01/31/2019   Procedure: ENTEROSCOPY;  Surgeon: Jerene Bears, MD;  Location: [place];  Service: Gastroenterology;  Laterality: N/A;  . VIDEO ASSISTED THORACOSCOPY (VATS)/DECORTICATION      History reviewed. No pertinent family history.  Social History   Tobacco Use  . Smoking status: Unknown If Ever Smoked  Substance Use Topics  . Alcohol use: Not Currently  . Drug use: Not Currently    REVIEW OF SYSTEMS: Unobtainable secondary to intubation and sedation    PHYSICAL EXAM:  Vitals:   02/20/19 1800 02/20/19 1913  BP: 107/60 (!) 109/58  Pulse: (!) 103 99  Resp: 17 18  Temp: 99.9 F (37.7 C)   SpO2: 91% 90%   I/O last 3 completed shifts: In: 1233.7 [I.V.:433.7; IV  Piggyback:800] Out: -    General: Sedated, no acute distress HEENT: MMM [place] AT anicteric sclera Neck:  No JVD, no adenopathy CV:  Heart RRR  Lungs:  L/S CTA coarse bilaterally Abd:  abd SNT/ND with normal BS GU:  Bladder non-palpable Extremities:  No LE edema, left upper extremity AV graft with a good thrill and bruit. Skin:  No skin rash Psych:  normal mood and affect Neuro:  no focal deficits   CURRENT MEDICATIONS:  . [START ON 02/21/2019] aspirin  81 mg Oral Daily  . Chlorhexidine Gluconate Cloth  6 each Topical Daily  . dextrose      . enoxaparin (LOVENOX) injection  30 mg Subcutaneous Q24H  . famotidine  20 mg Oral QHS  . feeding supplement (PRO-STAT SUGAR FREE 64)  30 mL Per Tube BID  . feeding supplement (VITAL HIGH PROTEIN)  1,000 mL Per Tube Q24H     HOME MEDICATIONS:  Prior to Admission medications   Medication Sig Start Date End Date Taking? Authorizing Provider  amLODipine (NORVASC) 5 MG tablet Take 5 mg by mouth 2 (two) times a day. 12/24/18  Yes [provider]  aspirin 81 MG chewable tablet Chew 81 mg by mouth daily.   Yes [provider]  atorvastatin (LIPITOR) 20 MG tablet Take 20 mg by mouth daily.   Yes [provider]  b complex-vitamin c-folic acid (NEPHRO-VITE) 0.8 MG TABS tablet Take 1 tablet by mouth daily.  Yes [provider]  carvedilol (COREG) 25 MG tablet Take 25 mg by mouth 2 (two) times a day. 08/15/17  Yes [provider]  cholecalciferol (VITAMIN D) 25 MCG (1000 UT) tablet Take 25 mcg by mouth daily.   Yes [provider]  cloNIDine (CATAPRES) 0.3 MG tablet Take 0.3 mg by mouth 2 (two) times a day.   Yes [provider]  COPAXONE 40 MG/ML SOSY Inject 40 mg into the skin See admin instructions. Inject 40mg  one time per day every Monday, Wednesday, and Friday 01/14/19  Yes [provider]  ferrous sulfate 325 (65 FE) MG tablet Take 325 mg by mouth daily.   Yes [provider]  fexofenadine (ALLEGRA) 180 MG tablet Take 180 mg by mouth daily.   Yes [provider]  FLUoxetine (PROZAC) 20 MG capsule Take 20 mg by mouth daily. 08/15/17  Yes [provider]  fluticasone (FLONASE) 50 MCG/ACT nasal spray Place 1 spray into both nostrils as needed for allergies.   Yes [provider]  Fluticasone Furoate (ARNUITY ELLIPTA) 100 MCG/ACT AEPB Inhale 1 puff into the lungs daily.   Yes [provider]  Fluticasone Propionate, Inhal, (FLOVENT DISKUS) 100 MCG/BLIST AEPB Inhale 100 mcg into the lungs 2 (two) times a day.    Yes [provider]  Folic Acid-Vit Q000111Q 123456 0.5-5-0.2 MG TABS Take 1 tablet by mouth daily.   Yes [provider]  gabapentin (NEURONTIN) 100 MG capsule Take 100 mg by mouth 2 (two) times a day.   Yes [provider]  hydrALAZINE (APRESOLINE) 100 MG tablet Take 100 mg by mouth 3 (three) times daily.   Yes [provider]  HYDROcodone-acetaminophen (NORCO/VICODIN) 5-325 MG tablet Take 1 tablet by mouth every 6 (six) hours as needed. 02/16/19  Yes Gherghe, Vella Redhead, MD  Insulin Detemir (LEVEMIR FLEXTOUCH) 100 UNIT/ML Pen Inject 10 Units as directed at bedtime as needed.   Yes [provider]  linagliptin (TRADJENTA) 5 MG TABS tablet Take 5 mg by mouth daily.   Yes [provider]  lisinopril (ZESTRIL) 10 MG tablet Take 1 tablet (10 mg total) by mouth daily. 02/17/19  Yes Gherghe, Vella Redhead, MD  montelukast (SINGULAIR) 10 MG tablet Take 10 mg by mouth daily.   Yes [provider]  omeprazole (PRILOSEC) 20 MG capsule Take 20 mg by mouth daily.   Yes [provider]  ondansetron (ZOFRAN) 4 MG tablet Take 4 mg by mouth every 8 (eight) hours as needed for nausea or vomiting.   Yes [provider]  traMADol (ULTRAM) 50 MG tablet Take 1 tablet (50 mg total) by mouth every 12 (twelve) hours as needed for moderate pain. 02/16/19  Yes Caren Griffins, MD   traZODone (DESYREL) 50 MG tablet Take 50 mg by mouth at bedtime.   Yes [provider]  dicyclomine (BENTYL) 20 MG tablet Take 20 mg by mouth 3 (three) times daily as needed for spasms.    [provider]  ondansetron (ZOFRAN-ODT) 4 MG disintegrating tablet Take 4 mg by mouth every 4 (four) hours as needed for nausea/vomiting.    [provider]       LABS:  CBC Latest Ref Rng & Units 02/20/2019 02/20/2019 02/20/2019  WBC 4.0 - 10.5 K/uL 12.3(H) - 10.1  Hemoglobin 13.0 - 17.0 g/dL 12.4(L) 11.2(L) 12.5(L)  Hematocrit 39.0 - 52.0 % 39.3 33.0(L) 42.1  Platelets 150 - 400 K/uL 226 - 335    CMP  Latest Ref Rng & Units 02/20/2019 02/20/2019 02/20/2019  Glucose 70 - 99 mg/dL 84 - 56(L)  BUN 8 - 23 mg/dL 35(H) - 30(H)  Creatinine 0.61 - 1.24 mg/dL 6.96(H) - 6.43(H)  Sodium 135 - 145 mmol/L 135 133(L) 135  Potassium 3.5 - 5.1 mmol/L 3.9 3.5 3.9  Chloride 98 - 111 mmol/L 93(L) - 93(L)  CO2 22 - 32 mmol/L 22 - 25  Calcium 8.9 - 10.3 mg/dL 9.8 - 10.0  Total Protein 6.5 - 8.1 g/dL 8.0 - 9.2(H)  Total Bilirubin 0.3 - 1.2 mg/dL 0.6 - 0.4  Alkaline Phos 38 - 126 U/L 98 - 121  AST 15 - 41 U/L 24 - 24  ALT 0 - 44 U/L 11 - 12    Lab Results  Component Value Date   CALCIUM 9.8 02/20/2019   CAION 1.17 02/20/2019   PHOS 5.3 (H) 02/20/2019    No results found for: COLORURINE, APPEARANCEUR, LABSPEC, PHURINE, GLUCOSEU, HGBUR, BILIRUBINUR, KETONESUR, PROTEINUR, UROBILINOGEN, NITRITE, LEUKOCYTESUR    Component Value Date/Time   PHART 7.394 02/20/2019 1729   PCO2ART 48.4 (H) 02/20/2019 1729   PO2ART 254.0 (H) 02/20/2019 1729   HCO3 29.3 (H) 02/20/2019 1729   TCO2 31 02/20/2019 1729   O2SAT 100.0 02/20/2019 1729       Component Value Date/Time   IRON 75 02/03/2019 1117   TIBC 169 (L) 02/03/2019 1117   FERRITIN 1,429 (H) 02/03/2019 1117   IRONPCTSAT 44 (H) 02/03/2019 1117       ASSESSMENT/PLAN:     1.  End-stage renal disease on hemodialysis.  Will assess for  hemodialysis need tomorrow morning.  Volume status and electrolytes are stable.  2.  Acute hypoxemic respiratory failure.  Likely secondary to pneumonitis.  History of left pleural effusion status post left VATS in July 2020 with loculated basilar left hydro-with pneumothorax.  CT of the chest pending.  Continue antibiotics.  3.  Acute metabolic encephalopathy secondary to hypoglycemia.  Continue to monitor blood sugars closely.  Continue D10 for now.  4.  Anemia of chronic kidney disease.  Hemoglobin is above goal.  Hold ESA.  5.  Hyperphosphatemia.  Monitor on tube feeds.     [place], DO, [place]

## 2019-02-20 NOTE — ED Provider Notes (Addendum)
[facility] EMERGENCY DEPARTMENT Provider Note   CSN: [id number] Arrival date & time: 02/20/19  [id number]    History   Chief Complaint Chief Complaint  Patient presents with   Hypoglycemia    HPI Cole Nelson is a 63 y.o. male.     Patient brought in from [place].  Patient has been hospitalized since July 11 part of that was at [place].  Then transferred from there to the ICU [facility] on 23 July.  And then was discharged home off of the hospitalist service on August 12 to the nursing facility.  Nursing facility noted that he was unresponsive this morning his blood sugars were in the 30s.  Apparently he was fine yesterday.  Patient does have a complicated past medical history.  He has had lots of gastroparesis and vomiting diabetes cerebrovascular accident in the past with left-sided residual weakness a history of congestive heart failure multiple sclerosis since 2007 which this past year has been getting worse.  And recurrent left pleural effusions.     Past Medical History:  Diagnosis Date   Anemia    CHF (congestive heart failure) (HCC)    CVA (cerebral vascular accident) ([place])    Diabetes mellitus without complication ([place])    ESRD (end stage renal disease) on dialysis ([place])    Gastroparesis due to DM ([place])    Multiple sclerosis (HCC)    Recurrent left pleural effusion    Tobacco abuse     Patient Active Problem List   Diagnosis Date Noted   Respiratory failure with hypoxia ([place]) 02/20/2019   Nausea and vomiting    Acute gastric erosion    Abnormal CT scan, small bowel    Protein-calorie malnutrition, severe 01/29/2019   Acute encephalopathy 01/28/2019   Pressure injury of skin 01/28/2019   Pleural effusion    End-stage renal disease on hemodialysis [facility])     Past Surgical History:  Procedure Laterality Date   BIOPSY  01/31/2019   Procedure: BIOPSY;  Surgeon: Jerene Bears, MD;  Location: [facility] ENDOSCOPY;  Service:  Gastroenterology;;   ENTEROSCOPY N/A 01/31/2019   Procedure: ENTEROSCOPY;  Surgeon: Jerene Bears, MD;  Location: [facility];  Service: Gastroenterology;  Laterality: N/A;   VIDEO ASSISTED THORACOSCOPY (VATS)/DECORTICATION          Home Medications    Prior to Admission medications   Medication Sig Start Date End Date Taking? Authorizing Provider  amLODipine (NORVASC) 5 MG tablet Take 5 mg by mouth 2 (two) times a day. 12/24/18   [provider]  aspirin 81 MG chewable tablet Chew 81 mg by mouth daily.    [provider]  atorvastatin (LIPITOR) 20 MG tablet Take 20 mg by mouth daily.    [provider]  b complex-vitamin c-folic acid (NEPHRO-VITE) 0.8 MG TABS tablet Take 1 tablet by mouth daily.    [provider]  carvedilol (COREG) 25 MG tablet Take 25 mg by mouth 2 (two) times a day. 08/15/17   [provider]  cholecalciferol (VITAMIN D) 25 MCG (1000 UT) tablet Take 25 mcg by mouth daily.    [provider]  cloNIDine (CATAPRES) 0.3 MG tablet Take 0.3 mg by mouth 2 (two) times a day.    [provider]  COPAXONE 40 MG/ML SOSY Inject 40 mg as directed 3 (three) times a week. 01/14/19   [provider]  dicyclomine (BENTYL) 20 MG tablet Take 20 mg by mouth 3 (three) times daily as needed for spasms.  [provider]  ferrous sulfate 325 (65 FE) MG tablet Take 325 mg by mouth daily.    [provider]  fexofenadine (ALLEGRA) 180 MG tablet Take 180 mg by mouth daily.    [provider]  FLUoxetine (PROZAC) 20 MG capsule Take 20 mg by mouth daily. 08/15/17   [provider]  fluticasone (FLONASE) 50 MCG/ACT nasal spray Place 1 spray into both nostrils as needed for allergies.    [provider]  Fluticasone Propionate, Inhal, (FLOVENT DISKUS) 100 MCG/BLIST AEPB Inhale 100 mcg into the lungs 2 (two) times a day.     [provider]  Folic Acid-Vit [id number] [id number] 0.5-5-0.2  MG TABS Take 1 tablet by mouth daily.    [provider]  gabapentin (NEURONTIN) 100 MG capsule Take 100 mg by mouth 2 (two) times a day.    [provider]  hydrALAZINE (APRESOLINE) 100 MG tablet Take 100 mg by mouth 3 (three) times daily.    [provider]  HYDROcodone-acetaminophen (NORCO/VICODIN) 5-325 MG tablet Take 1 tablet by mouth every 6 (six) hours as needed. 02/16/19   Caren Griffins, MD  Insulin Detemir (LEVEMIR FLEXTOUCH) 100 UNIT/ML Pen Inject 10 Units as directed at bedtime as needed.    [provider]  linagliptin (TRADJENTA) 5 MG TABS tablet Take 5 mg by mouth daily.    [provider]  lisinopril (ZESTRIL) 10 MG tablet Take 1 tablet (10 mg total) by mouth daily. 02/17/19   Caren Griffins, MD  montelukast (SINGULAIR) 10 MG tablet Take 10 mg by mouth daily.    [provider]  omeprazole (PRILOSEC) 20 MG capsule Take 20 mg by mouth daily.    [provider]  ondansetron (ZOFRAN-ODT) 4 MG disintegrating tablet Take 4 mg by mouth every 4 (four) hours as needed for nausea/vomiting.    [provider]  traMADol (ULTRAM) 50 MG tablet Take 1 tablet (50 mg total) by mouth every 12 (twelve) hours as needed for moderate pain. 02/16/19   Caren Griffins, MD  traZODone (DESYREL) 50 MG tablet Take 50 mg by mouth at bedtime.    [provider]    Family History History reviewed. No pertinent family history.  Social History Social History   Tobacco Use   Smoking status: Unknown If Ever Smoked  Substance Use Topics   Alcohol use: Not Currently   Drug use: Not Currently     Allergies   Baclofen, Cefepime, Insulins, and Penicillins   Review of Systems Review of Systems  Unable to perform ROS: Patient unresponsive     Physical Exam Updated Vital Signs BP (!) 157/76    Pulse 92    Temp 98.4 F (36.9 C)    Resp 18    Ht 1.803 m (5\' 11" )    Wt 61.2 kg    SpO2 100%    BMI 18.82 kg/m    Physical Exam Vitals signs and nursing note reviewed.  Constitutional:      Appearance: Normal appearance. He is well-developed.  HENT:     Head: Normocephalic and atraumatic.  Eyes:     Conjunctiva/sclera: Conjunctivae normal.     Pupils: Pupils are equal, round, and reactive to light.  Neck:     Musculoskeletal: Neck supple.  Cardiovascular:     Rate and Rhythm: Normal rate and regular rhythm.     Heart sounds: No murmur.  Pulmonary:     Effort: Respiratory distress present.     Breath  sounds: Rales present.     Comments: Wet sounding on the right side. Abdominal:     General: There is no distension.     Palpations: Abdomen is soft.  Musculoskeletal:        General: No swelling.     Comments: AV fistula left upper extremity with good thrill.  Skin:    General: Skin is warm and dry.     Capillary Refill: Capillary refill takes less than 2 seconds.  Neurological:     Mental Status: He is alert.     Comments: Patient will move the lower extremities and right arm spontaneously will move his head and hold it up spontaneously but does not wake up or talk does not follow commands.      ED Treatments / Results  Labs (all labs ordered are listed, but only abnormal results are displayed) Labs Reviewed  COMPREHENSIVE METABOLIC PANEL - Abnormal; Notable for the following components:      Result Value   Chloride 93 (*)    Glucose, Bld 56 (*)    BUN 30 (*)    Creatinine, Ser 6.43 (*)    Total Protein 9.2 (*)    GFR calc non Af Amer 8 (*)    GFR calc Af Amer 10 (*)    Anion gap 17 (*)    All other components within normal limits  CBC WITH DIFFERENTIAL/PLATELET - Abnormal; Notable for the following components:   Hemoglobin 12.5 (*)    MCH 24.7 (*)    MCHC 29.7 (*)    Neutro Abs 9.2 (*)    Lymphs Abs 0.4 (*)    All other components within normal limits  BRAIN NATRIURETIC PEPTIDE - Abnormal; Notable for the following components:   B Natriuretic Peptide 478.0 (*)    All  other components within normal limits  CBG MONITORING, ED - Abnormal; Notable for the following components:   Glucose-Capillary 29 (*)    All other components within normal limits  CBG MONITORING, ED - Abnormal; Notable for the following components:   Glucose-Capillary 32 (*)    All other components within normal limits  CBG MONITORING, ED - Abnormal; Notable for the following components:   Glucose-Capillary 174 (*)    All other components within normal limits  SARS CORONAVIRUS 2 (HOSPITAL ORDER, [facility])  CULTURE, BLOOD (ROUTINE X 2)  CULTURE, BLOOD (ROUTINE X 2)  URINE CULTURE  LIPASE, BLOOD  LACTIC ACID, PLASMA  APTT  PROTIME-INR  URINALYSIS, ROUTINE W REFLEX MICROSCOPIC  BLOOD GAS, ARTERIAL  CBG MONITORING, ED    EKG EKG Interpretation  Date/Time:  Saturday February 20 2019 10:32:41 EDT Ventricular Rate:  98 PR Interval:    QRS Duration: 92 QT Interval:  369 QTC Calculation: 472 R Axis:   112 Text Interpretation:  Sinus rhythm Probable left atrial enlargement Left posterior fascicular block Anterior infarct, old Confirmed by Fredia Sorrow [phone]) on 02/20/2019 10:37:02 AM   Radiology Ct Head Wo Contrast  Result Date: 02/20/2019 CLINICAL DATA:  Altered level of consciousness. EXAM: CT HEAD WITHOUT CONTRAST TECHNIQUE: Contiguous axial images were obtained from the base of the skull through the vertex without intravenous contrast. COMPARISON:  None. FINDINGS: Brain: Mild generalized parenchymal volume loss with commensurate dilatation of the ventricles and sulci. No hydrocephalus. Chronic small vessel ischemic changes within the bilateral periventricular and subcortical white matter regions. Small old lacunar infarcts within the bilateral thalami. No mass, hemorrhage, edema or other evidence of acute parenchymal abnormality.  No extra-axial hemorrhage. Vascular: Chronic calcified atherosclerotic changes of the large vessels at the skull base. No  unexpected hyperdense vessel. Skull: Normal. Negative for fracture or focal lesion. Sinuses/Orbits: No acute finding. Other: None. IMPRESSION: 1. No acute findings. No intracranial mass, hemorrhage or edema. 2. Atrophy and chronic ischemic changes, as detailed above. Electronically Signed   By: Franki Cabot M.D.   On: 02/20/2019 11:44   Dg Chest Portable 1 View  Result Date: 02/20/2019 CLINICAL DATA:  Endotracheal and enteric tube placement. EXAM: PORTABLE CHEST 1 VIEW COMPARISON:  Chest x-ray from same day at 8:40 a.m. CT chest dated February 01, 2019. FINDINGS: Interval placement of an endotracheal tube with the tip approximately 2.5 cm above the carina. New enteric tube entering the stomach with the tip below the field of view. Worsening consolidation in the right upper and lower lobes. Unchanged opacity in the peripheral left upper lobe. Increasing moderate left basilar pneumothorax, increased since study from earlier today. Skin fold over the peripheral right hemithorax on the second view. No large pleural effusion. No acute osseous abnormality. IMPRESSION: 1. Appropriately positioned endotracheal and enteric tubes. 2. Increasing moderate left basilar pneumothorax. 3. Progressive right upper and lower lobe consolidation. Electronically Signed   By: Titus Dubin M.D.   On: 02/20/2019 12:56   Dg Chest Port 1 View  Result Date: 02/20/2019 CLINICAL DATA:  Altered mental status. EXAM: PORTABLE CHEST 1 VIEW COMPARISON:  01/28/2019 FINDINGS: Heart size is normal. Bullous disease again seen in the left lung base. Mild opacity is again seen in the left midlung, which may be due to infiltrate or atelectasis. Increased opacity is seen in the inferior right upper lobe and right lower lobe since prior study, suspicious for pneumonia. No definite pleural effusion. IMPRESSION: 1. Increased opacity in inferior right upper lobe and right lower lobe, suspicious for pneumonia. 2. Atelectasis versus infiltrate in the left  midlung. 3. Bullous disease in left lung base. Electronically Signed   By: Marlaine Hind M.D.   On: 02/20/2019 09:13   [place] Ekg Site Rite  Result Date: 02/20/2019 If Site Rite image not attached, placement could not be confirmed due to current cardiac rhythm.   Procedures Procedures (including critical care time)  CRITICAL CARE Performed by: Fredia Sorrow Total critical care time: 45 minutes Critical care time was exclusive of separately billable procedures and treating other patients. Critical care was necessary to treat or prevent imminent or life-threatening deterioration. Critical care was time spent personally by me on the following activities: development of treatment plan with patient and/or surrogate as well as nursing, discussions with consultants, evaluation of patient's response to treatment, examination of patient, obtaining history from patient or surrogate, ordering and performing treatments and interventions, ordering and review of laboratory studies, ordering and review of radiographic studies, pulse oximetry and re-evaluation of patient's condition.  INTUBATION Performed by: Fredia Sorrow  Required items: required blood products, implants, devices, and special equipment available Patient identity confirmed: provided demographic data and hospital-assigned identification number Time out: Immediately prior to procedure a "time out" was called to verify the correct patient, procedure, equipment, support staff and site/side marked as required.  Indications: Hypoxia altered mental status not protecting airway.  Intubation method: #4 Glidescope Laryngoscopy   Preoxygenation: 1% oxygen BVM  Sedatives: 20 mg etomidate Paralytic: 100 mg succinylcholine  Tube Size: 7.5 cuffed  Post-procedure assessment: chest rise and ETCO2 monitor Breath sounds: equal and absent over the epigastrium Tube secured with: ETT holder Chest x-ray interpreted by  radiologist and me.  Chest  x-ray findings: endotracheal tube in appropriate position  Patient tolerated the procedure well with no immediate complications.      Medications Ordered in ED Medications  aztreonam (AZACTAM) 0.5 g in dextrose 5 % 50 mL IVPB (has no administration in time range)  vancomycin (VANCOCIN) 500 mg in sodium chloride 0.9 % 100 mL IVPB (has no administration in time range)  propofol (DIPRIVAN) 1000 MG/100ML infusion (5 mcg/kg/min  61.2 kg Intravenous New Bag/Given 02/20/19 1213)  dextrose 5 %-0.45 % sodium chloride infusion ( Intravenous Stopped 02/20/19 1343)  dextrose 5 %-0.45 % sodium chloride infusion (has no administration in time range)  glucagon (human recombinant) (GLUCAGEN) injection 1 mg (1 mg Intramuscular Given by Other 02/20/19 0803)  dextrose 50 % solution 50 mL (50 mLs Intravenous Given by Other 02/20/19 0840)  dextrose (GLUTOSE) 40 % oral gel 37.5 g (37.5 g Oral Given 02/20/19 0830)  sodium chloride 0.9 % bolus 500 mL ( Intravenous Stopped 02/20/19 0932)  vancomycin (VANCOCIN) IVPB 1000 mg/200 mL premix ( Intravenous Stopped 02/20/19 1131)  aztreonam (AZACTAM) 2 g in sodium chloride 0.9 % 100 mL IVPB ( Intravenous Stopped 02/20/19 1016)  succinylcholine (ANECTINE) injection 100 mg (100 mg Intravenous Given 02/20/19 1151)  etomidate (AMIDATE) injection 20 mg (20 mg Intravenous Given 02/20/19 1151)     Initial Impression / Assessment and Plan / ED Course  I have reviewed the triage vital signs and the nursing notes.  Pertinent labs & imaging results that were available during my care of the patient were reviewed by me and considered in my medical decision making (see chart for details).       Patient brought in from nursing facility.  Blood sugar was in the 30s.  We had difficulty getting IV access we used oral glucose.  Patient did not wake up.  Did start a left external jugular IV with a good flow.  We gave him an amp of D50.  He did not wake up.  Blood sugars came up above 100 with  that.  Patient went on to have a couple episodes of vomiting did seem to be protecting his airway.  Head CT negative chest x-ray raise concerns for right-sided pneumonia.  Most likely in the setting of the altered mental status and the vomiting may be an aspiration pneumonia.  Previously patient been started on broad-spectrum antibiotics for H CAP pneumonia due to his little bit tachycardia respiratory rate being up.  He was not febrile never hypotensive.  Eventually an additional IV was started in his right hand.  Patient went on to be intubated due to not protecting his airway and not waking up after correcting the blood sugar problem.  Patient's respiratory rate improved significantly.  Patient started on propofol drip.  Labs without any significant abnormalities.  Electrolytes fine other than being consistent with an end-stage renal dialysis patient.  Patient was apparently dialyzed yesterday.  No significant leukocytosis no significant anemia.  Patient's lactic acid was normal.  Sepsis protocol was initiated shortly after he came in.  His cussed with the critical care specialist at [facility] they will accept to the ICU there.  Patient had just been discharged from [facility] on the medicine service after being on the critical care service on August 12.   Final Clinical Impressions(s) / ED Diagnoses   Final diagnoses:  HCAP (healthcare-associated pneumonia)  Hypoxia  Altered mental status, unspecified altered mental status type  Hypoglycemia  Aspiration pneumonia of right lung,  unspecified aspiration pneumonia type, unspecified part of lung ([place])  ESRD on dialysis [facility])    ED Discharge Orders    None       Fredia Sorrow, MD 02/20/19 1347    Fredia Sorrow, MD 02/20/19 1439

## 2019-02-20 NOTE — Progress Notes (Signed)
Notified bedside nurse of need to draw blood cultures ( was able to insert another IV) Patient has been difficult stick.Marland Kitchen

## 2019-02-20 NOTE — Progress Notes (Signed)
Received pt from [facility] via [facility]. Pt placed on vent with no complications. VS within normal limits. RT will continue to monitor

## 2019-02-21 ENCOUNTER — Inpatient Hospital Stay (HOSPITAL_COMMUNITY): Payer: Medicare PPO

## 2019-02-21 DIAGNOSIS — Z992 Dependence on renal dialysis: Secondary | ICD-10-CM

## 2019-02-21 DIAGNOSIS — N186 End stage renal disease: Secondary | ICD-10-CM

## 2019-02-21 DIAGNOSIS — G9340 Encephalopathy, unspecified: Secondary | ICD-10-CM

## 2019-02-21 LAB — GRAM STAIN

## 2019-02-21 LAB — BASIC METABOLIC PANEL
Anion gap: 17 — ABNORMAL HIGH (ref 5–15)
BUN: 41 mg/dL — ABNORMAL HIGH (ref 8–23)
CO2: 26 mmol/L (ref 22–32)
Calcium: 8.5 mg/dL — ABNORMAL LOW (ref 8.9–10.3)
Chloride: 87 mmol/L — ABNORMAL LOW (ref 98–111)
Creatinine, Ser: 7.31 mg/dL — ABNORMAL HIGH (ref 0.61–1.24)
GFR calc Af Amer: 8 mL/min — ABNORMAL LOW (ref >60)
GFR calc non Af Amer: 7 mL/min — ABNORMAL LOW (ref >60)
Glucose, Bld: 161 mg/dL — ABNORMAL HIGH (ref 70–99)
Potassium: 3.8 mmol/L (ref 3.5–5.1)
Sodium: 130 mmol/L — ABNORMAL LOW (ref 135–145)

## 2019-02-21 LAB — CBC
HCT: 26.4 % — ABNORMAL LOW (ref 39.0–52.0)
Hemoglobin: 8.1 g/dL — ABNORMAL LOW (ref 13.0–17.0)
MCH: 25.4 pg — ABNORMAL LOW (ref 26.0–34.0)
MCHC: 30.7 g/dL (ref 30.0–36.0)
MCV: 82.8 fL (ref 80.0–100.0)
Platelets: 217 10*3/uL (ref 150–400)
RBC: 3.19 MIL/uL — ABNORMAL LOW (ref 4.22–5.81)
RDW: 14.8 % (ref 11.5–15.5)
WBC: 17.8 10*3/uL — ABNORMAL HIGH (ref 4.0–10.5)
nRBC: 0 % (ref 0.0–0.2)

## 2019-02-21 LAB — MAGNESIUM: Magnesium: 2.2 mg/dL (ref 1.7–2.4)

## 2019-02-21 LAB — HEMOGLOBIN AND HEMATOCRIT, BLOOD
HCT: 28.8 % — ABNORMAL LOW (ref 39.0–52.0)
Hemoglobin: 9 g/dL — ABNORMAL LOW (ref 13.0–17.0)

## 2019-02-21 LAB — LACTATE DEHYDROGENASE, PLEURAL OR PERITONEAL FLUID: LD, Fluid: 159 U/L — ABNORMAL HIGH (ref 3–23)

## 2019-02-21 LAB — GLUCOSE, CAPILLARY
Glucose-Capillary: 109 mg/dL — ABNORMAL HIGH (ref 70–99)
Glucose-Capillary: 109 mg/dL — ABNORMAL HIGH (ref 70–99)
Glucose-Capillary: 137 mg/dL — ABNORMAL HIGH (ref 70–99)
Glucose-Capillary: 138 mg/dL — ABNORMAL HIGH (ref 70–99)
Glucose-Capillary: 148 mg/dL — ABNORMAL HIGH (ref 70–99)
Glucose-Capillary: 150 mg/dL — ABNORMAL HIGH (ref 70–99)
Glucose-Capillary: 79 mg/dL (ref 70–99)

## 2019-02-21 LAB — BODY FLUID CELL COUNT WITH DIFFERENTIAL
Lymphs, Fluid: 0 %
Monocyte-Macrophage-Serous Fluid: 44 % — ABNORMAL LOW (ref 50–90)
Neutrophil Count, Fluid: 56 % — ABNORMAL HIGH (ref 0–25)
Total Nucleated Cell Count, Fluid: 2300 cu mm — ABNORMAL HIGH (ref 0–1000)

## 2019-02-21 LAB — POCT I-STAT 7, (LYTES, BLD GAS, ICA,H+H)
Acid-Base Excess: 2 mmol/L (ref 0.0–2.0)
Bicarbonate: 28.8 mmol/L — ABNORMAL HIGH (ref 20.0–28.0)
Calcium, Ion: 1.13 mmol/L — ABNORMAL LOW (ref 1.15–1.40)
HCT: 29 % — ABNORMAL LOW (ref 39.0–52.0)
Hemoglobin: 9.9 g/dL — ABNORMAL LOW (ref 13.0–17.0)
O2 Saturation: 91 %
Patient temperature: 99.9
Potassium: 3.7 mmol/L (ref 3.5–5.1)
Sodium: 131 mmol/L — ABNORMAL LOW (ref 135–145)
TCO2: 30 mmol/L (ref 22–32)
pCO2 arterial: 55 mmHg — ABNORMAL HIGH (ref 32.0–48.0)
pH, Arterial: 7.33 — ABNORMAL LOW (ref 7.350–7.450)
pO2, Arterial: 69 mmHg — ABNORMAL LOW (ref 83.0–108.0)

## 2019-02-21 LAB — PROTEIN, PLEURAL OR PERITONEAL FLUID: Total protein, fluid: 3.4 g/dL

## 2019-02-21 LAB — GLUCOSE, PLEURAL OR PERITONEAL FLUID: Glucose, Fluid: 125 mg/dL

## 2019-02-21 LAB — PHOSPHORUS: Phosphorus: 7.2 mg/dL — ABNORMAL HIGH (ref 2.5–4.6)

## 2019-02-21 LAB — LACTIC ACID, PLASMA: Lactic Acid, Venous: 2.1 mmol/L (ref 0.5–1.9)

## 2019-02-21 MED ORDER — SODIUM CHLORIDE 0.9 % IV SOLN: 100.0000 mL | INTRAVENOUS | Status: DC | PRN

## 2019-02-21 MED ORDER — ALBUMIN HUMAN 25 % IV SOLN
12.5000 g | Freq: Once | INTRAVENOUS | Status: AC
  Administered 2019-02-21: 04:00:00 12.5 g via INTRAVENOUS
  Filled 2019-02-21: qty 50

## 2019-02-21 MED ORDER — CHLORHEXIDINE GLUCONATE 0.12% ORAL RINSE (MEDLINE KIT)
15.0000 mL | Freq: Two times a day (BID) | OROMUCOSAL | Status: DC
  Administered 2019-02-22 – 2019-02-24 (×5): 15 mL via OROMUCOSAL

## 2019-02-21 MED ORDER — PIPERACILLIN-TAZOBACTAM IN DEX 2-0.25 GM/50ML IV SOLN
2.2500 g | Freq: Three times a day (TID) | INTRAVENOUS | Status: DC
  Administered 2019-02-21 – 2019-02-24 (×9): 2.25 g via INTRAVENOUS
  Filled 2019-02-21 (×11): qty 50

## 2019-02-21 MED ORDER — CALCIUM ACETATE (PHOS BINDER) 667 MG/5ML PO SOLN
667.0000 mg | Freq: Four times a day (QID) | ORAL | Status: DC
  Administered 2019-02-21 – 2019-02-24 (×11): 667 mg
  Filled 2019-02-21 (×13): qty 5

## 2019-02-21 MED ORDER — LIDOCAINE HCL (PF) 1 % IJ SOLN
INTRAMUSCULAR | Status: AC
  Filled 2019-02-21: qty 30

## 2019-02-21 MED ORDER — DEXMEDETOMIDINE HCL IN NACL 400 MCG/100ML IV SOLN
0.4000 ug/kg/h | INTRAVENOUS | Status: DC
  Administered 2019-02-21: 0.4 ug/kg/h via INTRAVENOUS
  Administered 2019-02-22: 0.6 ug/kg/h via INTRAVENOUS
  Administered 2019-02-22: 1.2 ug/kg/h via INTRAVENOUS
  Administered 2019-02-22 – 2019-02-23 (×2): 0.9 ug/kg/h via INTRAVENOUS
  Filled 2019-02-21 (×2): qty 100
  Filled 2019-02-21: qty 200
  Filled 2019-02-21 (×3): qty 100

## 2019-02-21 MED ORDER — LIDOCAINE-PRILOCAINE 2.5-2.5 % EX CREA: 1.0000 "application " | TOPICAL_CREAM | CUTANEOUS | Status: DC | PRN

## 2019-02-21 MED ORDER — PANTOPRAZOLE SODIUM 40 MG IV SOLR
40.0000 mg | INTRAVENOUS | Status: DC
  Administered 2019-02-21: 40 mg via INTRAVENOUS
  Filled 2019-02-21: qty 40

## 2019-02-21 MED ORDER — VITAL HIGH PROTEIN PO LIQD
1000.0000 mL | ORAL | Status: DC
  Administered 2019-02-21: 1000 mL

## 2019-02-21 MED ORDER — ORAL CARE MOUTH RINSE
15.0000 mL | OROMUCOSAL | Status: DC
  Administered 2019-02-21 – 2019-02-24 (×24): 15 mL via OROMUCOSAL

## 2019-02-21 MED ORDER — HEPARIN SODIUM (PORCINE) 1000 UNIT/ML DIALYSIS: 1000.0000 [IU] | INTRAMUSCULAR | Status: DC | PRN

## 2019-02-21 NOTE — Progress Notes (Signed)
Pharmacy Antibiotic Note  Cole Nelson is a 63 y.o. male with ESRD admitted on 02/20/2019 with concern for pneumonia and aspiration. Pharmacy originally consulted for Azactam however MD wanted to trial Zosyn since penicillin allergy is unknown. Pt is ESRD - will adjust the Zosyn dosing today to avoid utilizing the extended interval dosing with HD. Noted Vancomycin discontinued this morning by MD due to MRSA PCR neg.   Plan: - Adjust Zosyn to 2.25g IV every 8 hours - Will continue to follow HD schedule/duration, culture results, LOT, and antibiotic de-escalation plans   Height: 5\' 11"  (180.3 cm) Weight: 130 lb 15.3 oz (59.4 kg) IBW/kg (Calculated) : 75.3  Temp (24hrs), Avg:98.5 F (36.9 C), Min:96.3 F (35.7 C), Max:100 F (37.8 C)  Recent Labs  Lab 02/15/19 0343 02/16/19 0518 02/20/19 0821 02/20/19 0912 02/20/19 1901 02/20/19 2326 02/21/19 0440  WBC  --  5.2 10.1  --  12.3*  --  17.8*  CREATININE 6.37* 8.18* 6.43*  --  6.96*  --  7.31*  LATICACIDVEN  --   --   --  1.8 2.6* 3.2* 2.1*    Estimated Creatinine Clearance: 8.8 mL/min (A) (by C-G formula based on SCr of 7.31 mg/dL (H)).    Allergies  Allergen Reactions  . Baclofen     confusion  . Cefepime     Confusion, hives   . Insulins     Patient states his mind started going in and out.   Marland Kitchen Penicillins     Antimicrobials this admission: Azactam 8/15 x 1 Vanc 8/15 >> 8/16 Zosyn 8/15 >>  Microbiology results: 8/15 COVID >> neg 8/15 MRSA PCR >> neg 8/15 BCx >> 8/15 RCx >> GS with gram variable rod + yeast  Thank you for allowing pharmacy to be a part of this patient's care.  Alycia Rossetti, PharmD, BCPS Clinical Pharmacist Clinical phone for 02/21/2019: [phone] 02/21/2019 8:45 AM   **Pharmacist phone directory can now be found on [email or web address] ([email or web address]).  Listed under [place].

## 2019-02-21 NOTE — Progress Notes (Addendum)
Person Progress Note Patient Name: Cole Nelson DOB: 1956/04/20 MRN: [id number]   Date of Service  02/21/2019  HPI/Events of Note  RN reported lactate level of 3.2 Belly is soft, no tenderness on palpation Has an NG tube in which has drained around 350 cc since arrival, hooked to LIS  No fever, HR in 80s, SBP is 129, o2 sat is 94 on 40% fio2 and he is comfortable on camera Only has one iv line in right arm. No further IV lines could be placed, IV team tried already. RN asked if he needs a central line.   eICU Interventions  Currently is on propofol, fentanyl, d10 drips and his antibiotics Keep NPO Is on vanc/zosyn Try albumin x 1 Hold off on further volume repletion since ESRD, anuric and due for HD in AM Repeat lactate at 3 am Change pepcid to IV May very well need a central line in AM Will need a central line sooner if insufficient Ivs or develops any hemodynamic changes. Have asked RN to let me know about lactate as well as if any changes occur.      Intervention Category Major Interventions: Sepsis - evaluation and management;Respiratory failure - evaluation and management  Margaretmary Lombard 02/21/2019, 12:33 AM   12:50 am  I called and discussed the case with ICU attending Dr Mariane Masters. He will assess the patient for line placement.

## 2019-02-21 NOTE — Progress Notes (Addendum)
NAME:  Cole Nelson, MRN:  [id number], DOB:  1955-08-24, LOS: 1 ADMISSION DATE:  02/20/2019, CONSULTATION DATE:  02/20/2019 REFERRING MD:  Dr. Rogene Houston, [facility], CHIEF COMPLAINT: Altered mental status  Brief History   63 yo male from [place] [facility] with altered mental status and found to have blood sugar of 31.  Had vomiting and aspiration event, and required intubation.  Was in hospital 7/23 to 8/12 with vomiting from gastroparesis.  Past Medical History  ESRD, MS, DM, CVA, Gastroparesis, HTN, Lt pleural effusion s/p VATS January 18, 2019  Significant Hospital Events   8/15 Admit  Consults:  Renal 8/15 ESRD  Procedures:  ETT 8/15 >> RIJ 8/16 >>  Significant Diagnostic Tests:  CT head 8/15 >> atrophy and chronic ischemic changes CT chest 8/15 >> Persistent large left-sided hydropneumothorax with improved aeration within the left upper and left lower lobes.  New extensive pulmonary opacities throughout both lungs, with large areas of consolidation involving the right lower lobe, right middle lobe, and right upper lobe. These findings are concerning for multifocal pneumonia versus aspiration. Moderate size right-sided pleural effusion which may be partially loculated.  Micro Data:  COVID 8/15 >> negative Blood 8/15 >> Sputum 8/15 >> Urine 8/15 >>   Antimicrobials:  Zosyn 8/15 >>  Vancomycin 8/15 >> 8/16  Interim history/subjective:  Afebrile Blood pressure dropped on propofol but stabilized once this was stopped Anuric Remains critically ill, intubated   Objective   Blood pressure 132/71, pulse 88, temperature 97.9 F (36.6 C), resp. rate 17, height 5\' 11"  (1.803 m), weight 59.4 kg, SpO2 94 %.    Vent Mode: PRVC FiO2 (%):  [40 %-100 %] 40 % Set Rate:  [16 bmp-18 bmp] 18 bmp Vt Set:  [600 mL] 600 mL PEEP:  [5 cmH20] 5 cmH20 Plateau Pressure:  [18 cmH20-29 cmH20] 18 cmH20   Intake/Output Summary (Last 24 hours) at 02/21/2019 [id number] Last data filed at 02/21/2019 0600  Gross per 24 hour  Intake 2757.58 ml  Output 850 ml  Net 1907.58 ml   Filed Weights   02/20/19 0747 02/20/19 1700 02/21/19 0500  Weight: 61.2 kg 60.5 kg 59.4 kg    Examination:  General -acutely ill, sedated on fentanyl drip Eyes - pupils reactive ENT -oral ETT in place, mild pallor, no icterus Cardiac - regular rate/rhythm, no murmur Chest -crackles at right base, decreased breath sounds on left, chest tube suture in Lt chest Abdomen - soft, non tender, + bowel sounds Extremities - decreased muscle bulk Skin - Rt buttock stage 2 pressure ulcer Neuro - withdraws to pain, not following commands  CXR 8/16 personally reviewed- Rt extensive consolidation lower zone, loculated Lt basilar hydropneumothorax noted prior   Resolved Hospital Problem list     Assessment & Plan:   Acute hypoxic respiratory failure with altered mental status and compromised airway from hypoglycemia complicated by aspiration pneumonitis. Hx of Lt pleural effusion s/p Lt VATS July 2020 now with loculated basilar Lt hydropneumothorax. Hx of asthma. RT parapneumonic effusion? loculated -Start spontaneous breathing trials - ct  Zosyn 2/8, continue vancomycin since MRSA PCR negative - prn BDs -May consider right thoracentesis   Acute metabolic encephalopathy with hypoglycemia. Hx of MS, CVA. - RASS goal 0 -Use Precedex and minimize fentanyl to enable neuro checks, propofol because blood pressure to drop - might need further neuro assessment if mental status doesn't improve - continue ASA - hold outpt prozac, neurontin, copaxone  Hx of DM. 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 - consulted nephrology, no immediate indication for dialysis  Severe protein calorie malnutrition. -Start trickle tube feeds  Rt buttock pressure ulcer. - present prior to admission - wound care  Hx of HTN, HLD. - hold outpt coreg, lipitor, catapres, hydralazine,  lisinopril  Best practice:  Diet: tube feeds DVT prophylaxis: lovenox GI prophylaxis: Pepcid Mobility: bed rest Code Status: full code Family communication: attempted to contact pt's wife >> no answer at listed numbers Disposition: ICU   The patient is critically ill with multiple organ systems failure and requires high complexity decision making for assessment and support, frequent evaluation and titration of therapies, application of advanced monitoring technologies and extensive interpretation of multiple databases. Critical Care Time devoted to patient care services described in this note independent of APP/resident  time is 32 minutes.   Kara Mead MD. [place] Flood. [facility] Pager [phone] If no response call [phone]    02/21/2019, 8:11 AM

## 2019-02-21 NOTE — Procedures (Signed)
Ultrasound-guided diagnostic and therapeutic right thoracentesis performed yielding 400 cc of yellow  fluid. No immediate complications. Follow-up chest x-ray pending. The fluid was sent to the lab for preordered studies. EBL < 1cc.

## 2019-02-21 NOTE — Progress Notes (Signed)
[facility]    NEPHROLOGY PROGRESS NOTE  SUBJECTIVE: Patient seen and examined this morning.  Patient sedated.  No acute events per RN.  Thoracentesis planned for today.   OBJECTIVE:  Vitals:   02/21/19 1121 02/21/19 1200  BP:  (!) 90/52  Pulse:  85  Resp:  18  Temp:  99.1 F (37.3 C)  SpO2: 100% 97%    Intake/Output Summary (Last 24 hours) at 02/21/2019 1226 Last data filed at 02/21/2019 1100 Gross per 24 hour  Intake 2318.23 ml  Output 850 ml  Net 1468.23 ml      General: Sedated, no acute distress HEENT: MMM [place] AT anicteric sclera Neck:  No JVD, no adenopathy CV:  Heart RRR  Lungs:  L/S CTA coarse bilaterally Abd:  abd SNT/ND with normal BS GU:  Bladder non-palpable Extremities:  No LE edema, left upper extremity AV graft with a good thrill and bruit. Skin:  No skin rash Psych:  normal mood and affect Neuro:  no focal deficits  MEDICATIONS:  . aspirin  81 mg Oral Daily  . Chlorhexidine Gluconate Cloth  6 each Topical Daily  . enoxaparin (LOVENOX) injection  30 mg Subcutaneous Q24H  . feeding supplement (PRO-STAT SUGAR FREE 64)  30 mL Per Tube BID  . feeding supplement (VITAL HIGH PROTEIN)  1,000 mL Per Tube Q24H  . lidocaine (PF)      . pantoprazole (PROTONIX) IV  40 mg Intravenous Q24H       LABS:   CBC Latest Ref Rng & Units 02/21/2019 02/21/2019 02/20/2019  WBC 4.0 - 10.5 K/uL 17.8(H) - 12.3(H)  Hemoglobin 13.0 - 17.0 g/dL 8.1(L) 9.9(L) 12.4(L)  Hematocrit 39.0 - 52.0 % 26.4(L) 29.0(L) 39.3  Platelets 150 - 400 K/uL 217 - 226    CMP Latest Ref Rng & Units 02/21/2019 02/21/2019 02/20/2019  Glucose 70 - 99 mg/dL 161(H) - 84  BUN 8 - 23 mg/dL 41(H) - 35(H)  Creatinine 0.61 - 1.24 mg/dL 7.31(H) - 6.96(H)  Sodium 135 - 145 mmol/L 130(L) 131(L) 135  Potassium 3.5 - 5.1 mmol/L 3.8 3.7 3.9  Chloride 98 - 111 mmol/L 87(L) - 93(L)  CO2 22 - 32 mmol/L 26 - 22  Calcium 8.9 - 10.3 mg/dL 8.5(L) - 9.8  Total Protein 6.5 - 8.1 g/dL - - 8.0  Total  Bilirubin 0.3 - 1.2 mg/dL - - 0.6  Alkaline Phos 38 - 126 U/L - - 98  AST 15 - 41 U/L - - 24  ALT 0 - 44 U/L - - 11    Lab Results  Component Value Date   CALCIUM 8.5 (L) 02/21/2019   CAION 1.13 (L) 02/21/2019   PHOS 7.2 (H) 02/21/2019    No results found for: COLORURINE, APPEARANCEUR, LABSPEC, PHURINE, GLUCOSEU, HGBUR, BILIRUBINUR, KETONESUR, PROTEINUR, UROBILINOGEN, NITRITE, LEUKOCYTESUR    Component Value Date/Time   PHART 7.330 (L) 02/21/2019 0340   PCO2ART 55.0 (H) 02/21/2019 0340   PO2ART 69.0 (L) 02/21/2019 0340   HCO3 28.8 (H) 02/21/2019 0340   TCO2 30 02/21/2019 0340   O2SAT 91.0 02/21/2019 0340       Component Value Date/Time   IRON 75 02/03/2019 1117   TIBC 169 (L) 02/03/2019 1117   FERRITIN 1,429 (H) 02/03/2019 1117   IRONPCTSAT 44 (H) 02/03/2019 1117       ASSESSMENT/PLAN:     1.  End-stage renal disease on hemodialysis.  HD today then resume TTS.  Volume status and electrolytes are stable.  2.  Acute  hypoxemic respiratory failure.  Likely secondary to pneumonitis.  History of left pleural effusion status post left VATS in July 2020 with loculated basilar left hydro-with pneumothorax.    Thoracentesis today. Continue antibiotics.  3.  Acute metabolic encephalopathy secondary to hypoglycemia.  Continue to monitor blood sugars closely.  Continue D10 for now.  4.  Anemia of chronic kidney disease.   Monitor serial hemoglobins. Hold ESA.  Recheck H&H with downtrend  5.  Hyperphosphatemia.  Monitor on tube feeds.    [place], DO, [place]

## 2019-02-21 NOTE — Progress Notes (Signed)
Called elink, informed elink nurse Gretchen pt lactic acid 2.6 no orders for fluid boluses have been given to date, bnp [phone]. Will continue to follow lactic acid levels.

## 2019-02-21 NOTE — Procedures (Signed)
Central Venous Catheter Insertion Procedure Note  Procedure: Insertion of Central Venous Catheter  Indications:  vascular access  Procedure Details  Informed consent was obtained for the procedure, including sedation.  Risks of lung perforation, hemorrhage, arrhythmia, and adverse drug reaction were discussed.   Maximum sterile technique was used including antiseptics, cap, gloves, gown, hand hygiene, mask and sheet.  Under sterile conditions the skin above the on the right internal jugular vein was prepped with betadine and covered with a sterile drape. Local anesthesia was applied to the skin and subcutaneous tissues. A 22-gauge needle was used to identify the vein. An 18-gauge needle was then inserted into the vein. A guide wire was then passed easily through the catheter. There were no arrhythmias. The catheter was then withdrawn. A 7.5 French triple-lumen was then inserted into the vessel over the guide wire. The catheter was sutured into place.  Findings: There were no changes to vital signs. Catheter was flushed with 10 cc NS. Patient did tolerate procedure well.  Recommendations: CXR ordered to verify placement.

## 2019-02-22 ENCOUNTER — Inpatient Hospital Stay (HOSPITAL_COMMUNITY): Payer: Medicare PPO

## 2019-02-22 DIAGNOSIS — I959 Hypotension, unspecified: Secondary | ICD-10-CM

## 2019-02-22 DIAGNOSIS — R579 Shock, unspecified: Secondary | ICD-10-CM

## 2019-02-22 DIAGNOSIS — R4182 Altered mental status, unspecified: Secondary | ICD-10-CM

## 2019-02-22 LAB — BLOOD CULTURE ID PANEL (REFLEXED)

## 2019-02-22 LAB — BASIC METABOLIC PANEL
Anion gap: 12 (ref 5–15)
BUN: 26 mg/dL — ABNORMAL HIGH (ref 8–23)
CO2: 27 mmol/L (ref 22–32)
Calcium: 8.7 mg/dL — ABNORMAL LOW (ref 8.9–10.3)
Chloride: 93 mmol/L — ABNORMAL LOW (ref 98–111)
Creatinine, Ser: 5.05 mg/dL — ABNORMAL HIGH (ref 0.61–1.24)
GFR calc Af Amer: 13 mL/min — ABNORMAL LOW (ref >60)
GFR calc non Af Amer: 11 mL/min — ABNORMAL LOW (ref >60)
Glucose, Bld: 178 mg/dL — ABNORMAL HIGH (ref 70–99)
Potassium: 4 mmol/L (ref 3.5–5.1)
Sodium: 132 mmol/L — ABNORMAL LOW (ref 135–145)

## 2019-02-22 LAB — GLUCOSE, CAPILLARY
Glucose-Capillary: 105 mg/dL — ABNORMAL HIGH (ref 70–99)
Glucose-Capillary: 180 mg/dL — ABNORMAL HIGH (ref 70–99)
Glucose-Capillary: 140 mg/dL — ABNORMAL HIGH (ref 70–99)
Glucose-Capillary: 148 mg/dL — ABNORMAL HIGH (ref 70–99)
Glucose-Capillary: 113 mg/dL — ABNORMAL HIGH (ref 70–99)
Glucose-Capillary: 125 mg/dL — ABNORMAL HIGH (ref 70–99)
Glucose-Capillary: 164 mg/dL — ABNORMAL HIGH (ref 70–99)

## 2019-02-22 LAB — CBC
HCT: 27 % — ABNORMAL LOW (ref 39.0–52.0)
Hemoglobin: 8.2 g/dL — ABNORMAL LOW (ref 13.0–17.0)
MCH: 25.4 pg — ABNORMAL LOW (ref 26.0–34.0)
MCHC: 30.4 g/dL (ref 30.0–36.0)
MCV: 83.6 fL (ref 80.0–100.0)
Platelets: 194 10*3/uL (ref 150–400)
RBC: 3.23 MIL/uL — ABNORMAL LOW (ref 4.22–5.81)
RDW: 14.8 % (ref 11.5–15.5)
WBC: 14.1 10*3/uL — ABNORMAL HIGH (ref 4.0–10.5)
nRBC: 0 % (ref 0.0–0.2)

## 2019-02-22 LAB — MAGNESIUM: Magnesium: 2 mg/dL (ref 1.7–2.4)

## 2019-02-22 LAB — PHOSPHORUS: Phosphorus: 5 mg/dL — ABNORMAL HIGH (ref 2.5–4.6)

## 2019-02-22 LAB — LACTATE DEHYDROGENASE: LDH: 148 U/L (ref 98–192)

## 2019-02-22 MED ORDER — HYDRALAZINE HCL 20 MG/ML IJ SOLN
INTRAMUSCULAR | Status: AC
  Filled 2019-02-22: qty 1

## 2019-02-22 MED ORDER — CARVEDILOL 25 MG PO TABS
25.0000 mg | ORAL_TABLET | Freq: Two times a day (BID) | ORAL | Status: DC
  Administered 2019-02-22 – 2019-02-26 (×7): 25 mg via ORAL
  Filled 2019-02-22 (×7): qty 1

## 2019-02-22 MED ORDER — INSULIN ASPART 100 UNIT/ML ~~LOC~~ SOLN
1.0000 [IU] | SUBCUTANEOUS | Status: DC
  Administered 2019-02-22: 2 [IU] via SUBCUTANEOUS
  Administered 2019-02-22 – 2019-02-23 (×3): 1 [IU] via SUBCUTANEOUS
  Administered 2019-02-23: 3 [IU] via SUBCUTANEOUS
  Administered 2019-02-23 – 2019-02-24 (×4): 2 [IU] via SUBCUTANEOUS
  Administered 2019-02-25: 1 [IU] via SUBCUTANEOUS
  Administered 2019-02-26 (×6): 2 [IU] via SUBCUTANEOUS
  Administered 2019-02-27: 3 [IU] via SUBCUTANEOUS
  Administered 2019-02-27: 1 [IU] via SUBCUTANEOUS
  Administered 2019-02-27: 2 [IU] via SUBCUTANEOUS
  Administered 2019-02-27: 1 [IU] via SUBCUTANEOUS
  Administered 2019-02-28: 2 [IU] via SUBCUTANEOUS
  Administered 2019-02-28: 1 [IU] via SUBCUTANEOUS
  Administered 2019-02-28 (×2): 2 [IU] via SUBCUTANEOUS
  Administered 2019-02-28: 1 [IU] via SUBCUTANEOUS

## 2019-02-22 MED ORDER — VITAL 1.5 CAL PO LIQD
1000.0000 mL | ORAL | Status: DC
  Administered 2019-02-22 – 2019-03-01 (×9): 1000 mL
  Filled 2019-02-22 (×10): qty 1000

## 2019-02-22 MED ORDER — PANTOPRAZOLE SODIUM 40 MG PO PACK
40.0000 mg | PACK | Freq: Every day | ORAL | Status: DC
  Administered 2019-02-22 – 2019-03-01 (×8): 40 mg
  Filled 2019-02-22 (×8): qty 20

## 2019-02-22 MED ORDER — ACETAMINOPHEN 160 MG/5ML PO SOLN
650.0000 mg | Freq: Four times a day (QID) | ORAL | Status: DC | PRN
  Administered 2019-02-22 – 2019-02-28 (×3): 650 mg via ORAL
  Filled 2019-02-22 (×3): qty 20.3

## 2019-02-22 MED ORDER — DARBEPOETIN ALFA 60 MCG/0.3ML IJ SOSY
60.0000 ug | PREFILLED_SYRINGE | INTRAMUSCULAR | Status: DC
  Administered 2019-02-23: 60 ug via INTRAVENOUS
  Filled 2019-02-22 (×3): qty 0.3

## 2019-02-22 MED ORDER — HYDRALAZINE HCL 20 MG/ML IJ SOLN
10.0000 mg | INTRAMUSCULAR | Status: DC | PRN
  Administered 2019-02-22: 40 mg via INTRAVENOUS
  Administered 2019-02-22: 20 mg via INTRAVENOUS
  Administered 2019-02-23: 10 mg via INTRAVENOUS
  Filled 2019-02-22: qty 2
  Filled 2019-02-22: qty 1

## 2019-02-22 NOTE — Progress Notes (Addendum)
PHARMACY - PHYSICIAN COMMUNICATION CRITICAL VALUE ALERT - BLOOD CULTURE IDENTIFICATION (BCID)  Cole Nelson is an 63 y.o. male who presented to [facility] on 02/20/2019 with a concern for pneumonia and aspiration currently on zosyn.  Assessment: Patient has BC positive 1/4 bottles for Coag Neg Staph MecA detected, likely contaminant will not treat at this time.  Name of physician (or Provider) Contacted: Dr. Nelda Marseille, MD  Current antibiotics: Zosyn  Changes to prescribed antibiotics recommended:  Recommendations accepted by provider  Continue zosyn therapy  Results for orders placed or performed during the hospital encounter of 02/20/19  Blood Culture ID Panel (Reflexed) (Collected: 02/20/2019  6:26 PM)  Result Value Ref Range   Enterococcus species NOT DETECTED NOT DETECTED   Listeria monocytogenes NOT DETECTED NOT DETECTED   Staphylococcus species DETECTED (A) NOT DETECTED   Staphylococcus aureus (BCID) NOT DETECTED NOT DETECTED   Methicillin resistance DETECTED (A) NOT DETECTED   Streptococcus species NOT DETECTED NOT DETECTED   Streptococcus agalactiae NOT DETECTED NOT DETECTED   Streptococcus pneumoniae NOT DETECTED NOT DETECTED   Streptococcus pyogenes NOT DETECTED NOT DETECTED   Acinetobacter baumannii NOT DETECTED NOT DETECTED   Enterobacteriaceae species NOT DETECTED NOT DETECTED   Enterobacter cloacae complex NOT DETECTED NOT DETECTED   Escherichia coli NOT DETECTED NOT DETECTED   Klebsiella oxytoca NOT DETECTED NOT DETECTED   Klebsiella pneumoniae NOT DETECTED NOT DETECTED   Proteus species NOT DETECTED NOT DETECTED   Serratia marcescens NOT DETECTED NOT DETECTED   Haemophilus influenzae NOT DETECTED NOT DETECTED   Neisseria meningitidis NOT DETECTED NOT DETECTED   Pseudomonas aeruginosa NOT DETECTED NOT DETECTED   Candida albicans NOT DETECTED NOT DETECTED   Candida glabrata NOT DETECTED NOT DETECTED   Candida krusei NOT DETECTED NOT DETECTED   Candida  parapsilosis NOT DETECTED NOT DETECTED   Candida tropicalis NOT DETECTED NOT DETECTED    Phillis Haggis 02/22/2019  2:25 PM

## 2019-02-22 NOTE — Progress Notes (Signed)
Assisted tele visit to patient with daughter. ° °Ineze Serrao P, RN  °

## 2019-02-22 NOTE — Progress Notes (Signed)
[facility]    NEPHROLOGY PROGRESS NOTE  SUBJECTIVE: Patient seen and examined this morning.  Patient sedated.  No acute events per RN.     OBJECTIVE:  Vitals:   02/22/19 1158 02/22/19 1213  BP:  (!) 166/86  Pulse:  (!) 120  Resp:  18  Temp: 100.1 F (37.8 C)   SpO2:  98%    Intake/Output Summary (Last 24 hours) at 02/22/2019 1322 Last data filed at 02/22/2019 1000 Gross per 24 hour  Intake 1978.12 ml  Output 2200 ml  Net -221.88 ml      General: Sedated, no acute distress HEENT: MMM [place] AT anicteric sclera Neck:  No JVD, no adenopathy CV:  Heart RRR  Lungs:  L/S CTA coarse bilaterally Abd:  abd SNT/ND with normal BS GU:  Bladder non-palpable Extremities:  No LE edema, left upper extremity AV graft with a good thrill and bruit. Skin:  No skin rash Psych:  normal mood and affect Neuro:  no focal deficits  MEDICATIONS:  . aspirin  81 mg Oral Daily  . calcium acetate (Phos Binder)  667 mg Per Tube Q6H  . carvedilol  25 mg Oral BID WC  . chlorhexidine gluconate (MEDLINE KIT)  15 mL Mouth Rinse BID  . Chlorhexidine Gluconate Cloth  6 each Topical Daily  . enoxaparin (LOVENOX) injection  30 mg Subcutaneous Q24H  . feeding supplement (PRO-STAT SUGAR FREE 64)  30 mL Per Tube BID  . insulin aspart  1-3 Units Subcutaneous Q4H  . mouth rinse  15 mL Mouth Rinse 10 times per day  . pantoprazole sodium  40 mg Per Tube Daily       LABS:   CBC Latest Ref Rng & Units 02/22/2019 02/21/2019 02/21/2019  WBC 4.0 - 10.5 K/uL 14.1(H) - 17.8(H)  Hemoglobin 13.0 - 17.0 g/dL 8.2(L) 9.0(L) 8.1(L)  Hematocrit 39.0 - 52.0 % 27.0(L) 28.8(L) 26.4(L)  Platelets 150 - 400 K/uL 194 - 217    CMP Latest Ref Rng & Units 02/22/2019 02/21/2019 02/21/2019  Glucose 70 - 99 mg/dL 178(H) 161(H) -  BUN 8 - 23 mg/dL 26(H) 41(H) -  Creatinine 0.61 - 1.24 mg/dL 5.05(H) 7.31(H) -  Sodium 135 - 145 mmol/L 132(L) 130(L) 131(L)  Potassium 3.5 - 5.1 mmol/L 4.0 3.8 3.7  Chloride 98 - 111  mmol/L 93(L) 87(L) -  CO2 22 - 32 mmol/L 27 26 -  Calcium 8.9 - 10.3 mg/dL 8.7(L) 8.5(L) -  Total Protein 6.5 - 8.1 g/dL - - -  Total Bilirubin 0.3 - 1.2 mg/dL - - -  Alkaline Phos 38 - 126 U/L - - -  AST 15 - 41 U/L - - -  ALT 0 - 44 U/L - - -    Lab Results  Component Value Date   CALCIUM 8.7 (L) 02/22/2019   CAION 1.13 (L) 02/21/2019   PHOS 5.0 (H) 02/22/2019    No results found for: COLORURINE, APPEARANCEUR, LABSPEC, PHURINE, GLUCOSEU, HGBUR, BILIRUBINUR, KETONESUR, PROTEINUR, UROBILINOGEN, NITRITE, LEUKOCYTESUR    Component Value Date/Time   PHART 7.330 (L) 02/21/2019 0340   PCO2ART 55.0 (H) 02/21/2019 0340   PO2ART 69.0 (L) 02/21/2019 0340   HCO3 28.8 (H) 02/21/2019 0340   TCO2 30 02/21/2019 0340   O2SAT 91.0 02/21/2019 0340       Component Value Date/Time   IRON 75 02/03/2019 1117   TIBC 169 (L) 02/03/2019 1117   FERRITIN 1,429 (H) 02/03/2019 1117   IRONPCTSAT 44 (H) 02/03/2019 1117  ASSESSMENT/PLAN:     1.  End-stage renal disease on hemodialysis. Continue HD TTS via LUE AVF.  Volume status and electrolytes are stable.  2.  Acute hypoxemic respiratory failure.  Likely secondary to pneumonitis.  History of left pleural effusion status post left VATS in July 2020 with loculated basilar left hydro-with pneumothorax.    S/p Thoracentesis. Continue antibiotics.  3.  Acute metabolic encephalopathy secondary to hypoglycemia.  Continue to monitor blood sugars closely.  On D10 for now.  4.  Anemia of chronic kidney disease.   Monitor serial hemoglobins. Restart ESA Recheck H&H with downtrend.  Is iron replete.  5.  Hyperphosphatemia.  Monitor on tube feeds.  On phoslo via tube  6.  HTN. Continue carvedilol.      [place], DO, [place]

## 2019-02-22 NOTE — Progress Notes (Signed)
NAME:  Cole Nelson, MRN:  [id number], DOB:  09-18-1955, LOS: 2 ADMISSION DATE:  02/20/2019, CONSULTATION DATE:  02/20/2019 REFERRING MD:  Dr. Rogene Houston, [facility], CHIEF COMPLAINT: Altered mental status  Brief History   63 yo male from [place] SN[facility] with altered mental status and found to have blood sugar of 31.  Had vomiting and aspiration event, and required intubation.  Was in hospital 7/23 to 8/12 with vomiting from gastroparesis.  Past Medical History  ESRD, MS, DM, CVA, Gastroparesis, HTN, Lt pleural effusion s/p VATS January 18, 2019  Significant Hospital Events   8/15 Admit  Consults:  Renal 8/15 ESRD  Procedures:  ETT 8/15 >> RIJ 8/16 >>  Significant Diagnostic Tests:  CT head 8/15 >> atrophy and chronic ischemic changes CT chest 8/15 >> Persistent large left-sided hydropneumothorax with improved aeration within the left upper and left lower lobes.  New extensive pulmonary opacities throughout both lungs, with large areas of consolidation involving the right lower lobe, right middle lobe, and right upper lobe. These findings are concerning for multifocal pneumonia versus aspiration. Moderate size right-sided pleural effusion which may be partially loculated.  Micro Data:  COVID 8/15 >> negative Blood 8/15 >> Sputum 8/15 >> Urine 8/15 >>   Antimicrobials:  Zosyn 8/15 >>  Vancomycin 8/15 >> 8/16  Interim history/subjective:  Febrile to 100.4 No hemodynamic concerns overnight  Objective   Blood pressure (!) 154/66, pulse 82, temperature (!) 100.4 F (38 C), temperature source Oral, resp. rate 13, height 5\' 11"  (1.803 m), weight 60.4 kg, SpO2 100 %.    Vent Mode: PSV;CPAP FiO2 (%):  [40 %] 40 % Set Rate:  [18 bmp] 18 bmp Vt Set:  [600 mL] 600 mL PEEP:  [5 cmH20] 5 cmH20 Pressure Support:  [10 cmH20] 10 cmH20 Plateau Pressure:  [16 cmH20-29 cmH20] 16 cmH20   Intake/Output Summary (Last 24 hours) at 02/22/2019 0831 Last data filed at 02/22/2019 0800 Gross per 24  hour  Intake 2143.71 ml  Output 2200 ml  Net -56.29 ml   Filed Weights   02/21/19 1245 02/21/19 1600 02/22/19 0500  Weight: 58.5 kg 56.8 kg 60.4 kg   Examination:  General - Acutely ill appearing male, sedate HEENT: [place]/AT, PERRL, EOM-I and MMM, ETT in place Cardiac - RRR, Nl S1/S2 and -M/R/G Chest - Diminished with a left sided sutured chest tube Abdomen - Soft, NT, ND and +BS Extremities - decreased muscle bulk Skin - Rt buttock stage 2 pressure ulcer Neuro - Withdraws to pain but not following commands  I reviewed CXT myself, right sided infiltrate, left sided hydropneumothorax and ETT in place.  Discussed with bedside RN.  Resolved Hospital Problem list     Assessment & Plan:   Acute hypoxic respiratory failure with altered mental status and compromised airway from hypoglycemia complicated by aspiration pneumonitis. Hx of Lt pleural effusion s/p Lt VATS July 2020 now with loculated basilar Lt hydropneumothorax. Hx of asthma. RT parapneumonic effusion? loculated - SBT as able - Continue Zosyn 2/8, Vanc d/ced 8/16 - PRN BDs - R thora done by IR, protein 3.4/8 (ransudative) and LDH 159/pending.  Glucose 125, 2300 WBC with 56% PMNs, not concerned for empyema at this point, no further interventions  Acute metabolic encephalopathy with hypoglycemia. Hx of MS, CVA. - RASS goal 0 - Precedex and minimize fentanyl to enable neuro checks, no further propofol (hemodynamics) - Might need further neuro assessment if mental status doesn't improve after sedation is off - Continue ASA - Hold  outpt prozac, neurontin, copaxone  Hx of DM. 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 - HD per renal - BMET in AM - Replace electrolytes as indicated  Severe protein calorie malnutrition. - TF per nutrition order placed today  Rt buttock pressure ulcer. - Present prior to admission - Wound care  Hx of HTN, HLD. -  Hold outpt coreg, lipitor, catapres, hydralazine, lisinopril  Begin PS trials, no extubation given mental status, start TF, SSI to sensitive, continue zosyn and monitor hydropneumothorax for now, doubt at active interventions will be helpful at this time  Best practice:  Diet: tube feeds DVT prophylaxis: lovenox GI prophylaxis: Pepcid Mobility: bed rest Code Status: full code Family communication: attempted to contact pt's wife >> no answer at listed numbers Disposition: ICU  The patient is critically ill with multiple organ systems failure and requires high complexity decision making for assessment and support, frequent evaluation and titration of therapies, application of advanced monitoring technologies and extensive interpretation of multiple databases.   Critical Care Time devoted to patient care services described in this note is  32  Minutes. This time reflects time of care of this signee Dr Jennet Maduro. This critical care time does not reflect procedure time, or teaching time or supervisory time of PA/NP/Med student/Med Resident etc but could involve care discussion time.  Rush Farmer, M.D. [facility] Medicine. Pager: [phone]. After hours pager: [phone].

## 2019-02-22 NOTE — Progress Notes (Signed)
Initial Nutrition Assessment  DOCUMENTATION CODES:   Severe malnutrition in context of chronic illness  INTERVENTION:    Vital 1.5 at 45 ml/h (1080 ml per day)   Pro-stat 30 ml BID   Provides 1820 kcal, 103 gm protein, 825 ml free water daily  NUTRITION DIAGNOSIS:   Severe Malnutrition related to chronic illness(ESRD) as evidenced by severe muscle depletion, severe fat depletion.  GOAL:   Patient will meet greater than or equal to 90% of their needs  MONITOR:   Vent status, TF tolerance, Labs, I & O's, Skin  REASON FOR ASSESSMENT:   Ventilator, Consult Enteral/tube feeding initiation and management  ASSESSMENT:   63 yo male admitted from SNF with AMS, glucose 31. Vomited and aspirated, requiring intubation. PMH includes gastroparesis, ESRD-HD, MS, DM, CVA, HTN, L pleural effusion s/p VATS 01/18/2019.    Received MD Consult for TF initiation and management. OG tube in place. Tolerating Vital High Protein at 20 ml/h via OGT currently.  Patient is currently intubated on ventilator support MV: 9.5 L/min Temp (24hrs), Avg:99 F (37.2 C), Min:97.5 F (36.4 C), Max:100.4 F (38 C)   Labs reviewed. Sodium 132 (L), BUN 26 (H), creatinine 5.05 (H), phosphorus 5 (H) CBG's: 180-140  Medications reviewed and include Phoslyra, Novolog.   Last admission weight 61.2 kg on 8/12. 1.4% weight loss within the past week, could be related to volume status.  I/O +2 L since admission  Patient was identified to have severe malnutrition during last hospitalization, this is ongoing.  NUTRITION - FOCUSED PHYSICAL EXAM:    Most Recent Value  Orbital Region  Unable to assess  Upper Arm Region  Severe depletion  Thoracic and Lumbar Region  Severe depletion  Buccal Region  Unable to assess  Temple Region  Severe depletion  Clavicle Bone Region  Severe depletion  Clavicle and Acromion Bone Region  Severe depletion  Scapular Bone Region  Unable to assess  Dorsal Hand  Unable to  assess  Patellar Region  Severe depletion  Anterior Thigh Region  Severe depletion  Posterior Calf Region  Severe depletion  Edema (RD Assessment)  None  Hair  Reviewed  Eyes  Unable to assess  Mouth  Unable to assess  Skin  Reviewed  Nails  Unable to assess       Diet Order:   Diet Order            Diet NPO time specified  Diet effective now              EDUCATION NEEDS:   Not appropriate for education at this time  Skin:  Skin Assessment: Reviewed RN Assessment  Last BM:  No BM documented this admission  Height:   Ht Readings from Last 1 Encounters:  02/20/19 5\' 11"  (1.803 m)    Weight:   Wt Readings from Last 1 Encounters:  02/22/19 60.4 kg    Ideal Body Weight:  78.2 kg  BMI:  Body mass index is 18.57 kg/m.  Estimated Nutritional Needs:   Kcal:  1800  Protein:  90-110 gm  Fluid:  1 L + UOP    Molli Barrows, RD, LDN, [place] Pager [phone] After Hours Pager [phone]

## 2019-02-23 ENCOUNTER — Inpatient Hospital Stay (HOSPITAL_COMMUNITY): Payer: Medicare PPO

## 2019-02-23 ENCOUNTER — Encounter (HOSPITAL_COMMUNITY): Payer: Self-pay | Admitting: Radiology

## 2019-02-23 LAB — CBC
HCT: 24.7 % — ABNORMAL LOW (ref 39.0–52.0)
Hemoglobin: 7.9 g/dL — ABNORMAL LOW (ref 13.0–17.0)
MCH: 25.6 pg — ABNORMAL LOW (ref 26.0–34.0)
MCHC: 32 g/dL (ref 30.0–36.0)
MCV: 80.2 fL (ref 80.0–100.0)
Platelets: 189 10*3/uL (ref 150–400)
RBC: 3.08 MIL/uL — ABNORMAL LOW (ref 4.22–5.81)
RDW: 14.7 % (ref 11.5–15.5)
WBC: 12.5 10*3/uL — ABNORMAL HIGH (ref 4.0–10.5)
nRBC: 0 % (ref 0.0–0.2)

## 2019-02-23 LAB — BLOOD GAS, ARTERIAL
Acid-Base Excess: 3.7 mmol/L — ABNORMAL HIGH (ref 0.0–2.0)
Bicarbonate: 28.1 mmol/L — ABNORMAL HIGH (ref 20.0–28.0)
Drawn by: 10006
FIO2: 40
MECHVT: 600 mL
O2 Saturation: 98.5 %
PEEP: 5 cmH2O
Patient temperature: 99.3
RATE: 18 resp/min
pCO2 arterial: 46.4 mmHg (ref 32.0–48.0)
pH, Arterial: 7.402 (ref 7.350–7.450)
pO2, Arterial: 111 mmHg — ABNORMAL HIGH (ref 83.0–108.0)

## 2019-02-23 LAB — BASIC METABOLIC PANEL
Anion gap: 14 (ref 5–15)
BUN: 49 mg/dL — ABNORMAL HIGH (ref 8–23)
CO2: 26 mmol/L (ref 22–32)
Calcium: 8.7 mg/dL — ABNORMAL LOW (ref 8.9–10.3)
Chloride: 90 mmol/L — ABNORMAL LOW (ref 98–111)
Creatinine, Ser: 6.2 mg/dL — ABNORMAL HIGH (ref 0.61–1.24)
GFR calc Af Amer: 10 mL/min — ABNORMAL LOW (ref >60)
GFR calc non Af Amer: 9 mL/min — ABNORMAL LOW (ref >60)
Glucose, Bld: 128 mg/dL — ABNORMAL HIGH (ref 70–99)
Potassium: 3.8 mmol/L (ref 3.5–5.1)
Sodium: 130 mmol/L — ABNORMAL LOW (ref 135–145)

## 2019-02-23 LAB — CULTURE, BLOOD (ROUTINE X 2)

## 2019-02-23 LAB — CULTURE, RESPIRATORY W GRAM STAIN

## 2019-02-23 LAB — PH, BODY FLUID: pH, Body Fluid: 7.3

## 2019-02-23 LAB — MAGNESIUM: Magnesium: 2.1 mg/dL (ref 1.7–2.4)

## 2019-02-23 LAB — GLUCOSE, CAPILLARY
Glucose-Capillary: 120 mg/dL — ABNORMAL HIGH (ref 70–99)
Glucose-Capillary: 191 mg/dL — ABNORMAL HIGH (ref 70–99)
Glucose-Capillary: 112 mg/dL — ABNORMAL HIGH (ref 70–99)
Glucose-Capillary: 206 mg/dL — ABNORMAL HIGH (ref 70–99)
Glucose-Capillary: 137 mg/dL — ABNORMAL HIGH (ref 70–99)

## 2019-02-23 LAB — PHOSPHORUS: Phosphorus: 4.1 mg/dL (ref 2.5–4.6)

## 2019-02-23 MED ORDER — HEPARIN SODIUM (PORCINE) 1000 UNIT/ML DIALYSIS
1000.0000 [IU] | INTRAMUSCULAR | Status: DC | PRN
  Administered 2019-02-25: 1000 [IU] via INTRAVENOUS_CENTRAL
  Filled 2019-02-23: qty 1

## 2019-02-23 MED ORDER — CLONIDINE HCL 0.1 MG PO TABS
0.1000 mg | ORAL_TABLET | Freq: Once | ORAL | Status: AC
  Administered 2019-02-23: 0.1 mg via ORAL
  Filled 2019-02-23: qty 1

## 2019-02-23 MED ORDER — CLONIDINE HCL 0.1 MG PO TABS
0.1000 mg | ORAL_TABLET | Freq: Three times a day (TID) | ORAL | Status: DC
  Administered 2019-02-23: 0.1 mg via ORAL
  Filled 2019-02-23: qty 1

## 2019-02-23 MED ORDER — LIDOCAINE HCL (PF) 1 % IJ SOLN: 5.0000 mL | INTRAMUSCULAR | Status: DC | PRN

## 2019-02-23 MED ORDER — HYDRALAZINE HCL 20 MG/ML IJ SOLN
10.0000 mg | Freq: Four times a day (QID) | INTRAMUSCULAR | Status: DC | PRN
  Administered 2019-02-23 – 2019-02-28 (×8): 10 mg via INTRAVENOUS
  Filled 2019-02-23 (×8): qty 1

## 2019-02-23 MED ORDER — CLONIDINE HCL 0.2 MG PO TABS
0.2000 mg | ORAL_TABLET | Freq: Three times a day (TID) | ORAL | Status: DC
  Administered 2019-02-23 – 2019-02-26 (×10): 0.2 mg via ORAL
  Filled 2019-02-23 (×11): qty 1

## 2019-02-23 MED ORDER — AMLODIPINE BESYLATE 5 MG PO TABS
5.0000 mg | ORAL_TABLET | Freq: Every day | ORAL | Status: DC
  Administered 2019-02-23 – 2019-02-24 (×2): 5 mg via ORAL
  Filled 2019-02-23 (×2): qty 1

## 2019-02-23 MED ORDER — SODIUM CHLORIDE 0.9 % IV SOLN: 100.0000 mL | INTRAVENOUS | Status: DC | PRN

## 2019-02-23 MED ORDER — MIDAZOLAM HCL 2 MG/2ML IJ SOLN
2.0000 mg | Freq: Once | INTRAMUSCULAR | Status: DC
  Filled 2019-02-23: qty 2

## 2019-02-23 MED ORDER — RENA-VITE PO TABS
1.0000 | ORAL_TABLET | Freq: Every day | ORAL | Status: DC
  Administered 2019-02-23 – 2019-02-27 (×5): 1 via ORAL
  Filled 2019-02-23 (×5): qty 1

## 2019-02-23 MED ORDER — BISACODYL 10 MG RE SUPP: 10.0000 mg | RECTAL | Status: DC | PRN

## 2019-02-23 MED ORDER — HYDRALAZINE HCL 50 MG PO TABS
100.0000 mg | ORAL_TABLET | Freq: Three times a day (TID) | ORAL | Status: DC
  Administered 2019-02-23 – 2019-02-26 (×10): 100 mg via ORAL
  Filled 2019-02-23 (×10): qty 2

## 2019-02-23 MED ORDER — DOCUSATE SODIUM 50 MG/5ML PO LIQD
100.0000 mg | Freq: Every day | ORAL | Status: DC
  Administered 2019-02-23: 100 mg via ORAL
  Filled 2019-02-23 (×2): qty 10

## 2019-02-23 MED ORDER — PENTAFLUOROPROP-TETRAFLUOROETH EX AERO: 1.0000 "application " | INHALATION_SPRAY | CUTANEOUS | Status: DC | PRN

## 2019-02-23 MED ORDER — DOCUSATE SODIUM 50 MG/5ML PO LIQD: 100.0000 mg | Freq: Two times a day (BID) | ORAL | Status: AC

## 2019-02-23 MED ORDER — HYDRALAZINE HCL 20 MG/ML IJ SOLN
10.0000 mg | Freq: Once | INTRAMUSCULAR | Status: AC
  Administered 2019-02-23: 10 mg via INTRAVENOUS
  Filled 2019-02-23: qty 1

## 2019-02-23 NOTE — Progress Notes (Signed)
[facility]    NEPHROLOGY PROGRESS NOTE  SUBJECTIVE:  Note coag neg staph in 1/4 bottles and felt to be contaminant per charting.  Received a PRN hydralazine for HTN.  Has been on precedex; opens eyes but not following commands per nursing.  157/66 on exam.   Review of systems:  Unable to obtain secondary to intubated and sedated  OBJECTIVE:  Vitals:   02/23/19 0430 02/23/19 0500  BP: (!) 157/65 (!) 151/67  Pulse: 83 84  Resp: 18 16  Temp:    SpO2: 100% 100%    Intake/Output Summary (Last 24 hours) at 02/23/2019 0543 Last data filed at 02/23/2019 0500 Gross per 24 hour  Intake 2382.87 ml  Output -  Net 2382.87 ml      General: adult male intubated   HEENT: MMM [place] AT anicteric sclera CV:  RRR no rub  Lungs:  coarse breath sounds bilaterally   Abd:  abd SNT/ND  GU:  Bladder non-palpable Extremities:  no lower extremity edema  Access: left upper extremity AV graft with a good thrill and bruit.   Neuro: sedation running; does not follow commands  MEDICATIONS:  . aspirin  81 mg Oral Daily  . calcium acetate (Phos Binder)  667 mg Per Tube Q6H  . carvedilol  25 mg Oral BID WC  . chlorhexidine gluconate (MEDLINE KIT)  15 mL Mouth Rinse BID  . Chlorhexidine Gluconate Cloth  6 each Topical Daily  . darbepoetin (ARANESP) injection - DIALYSIS  60 mcg Intravenous Q Tue-HD  . enoxaparin (LOVENOX) injection  30 mg Subcutaneous Q24H  . feeding supplement (PRO-STAT SUGAR FREE 64)  30 mL Per Tube BID  . insulin aspart  1-3 Units Subcutaneous Q4H  . mouth rinse  15 mL Mouth Rinse 10 times per day  . pantoprazole sodium  40 mg Per Tube Daily       LABS:   CBC Latest Ref Rng & Units 02/23/2019 02/22/2019 02/21/2019  WBC 4.0 - 10.5 K/uL 12.5(H) 14.1(H) -  Hemoglobin 13.0 - 17.0 g/dL 7.9(L) 8.2(L) 9.0(L)  Hematocrit 39.0 - 52.0 % 24.7(L) 27.0(L) 28.8(L)  Platelets 150 - 400 K/uL 189 194 -    CMP Latest Ref Rng & Units 02/23/2019 02/22/2019 02/21/2019  Glucose 70 -  99 mg/dL 128(H) 178(H) 161(H)  BUN 8 - 23 mg/dL 49(H) 26(H) 41(H)  Creatinine 0.61 - 1.24 mg/dL 6.20(H) 5.05(H) 7.31(H)  Sodium 135 - 145 mmol/L 130(L) 132(L) 130(L)  Potassium 3.5 - 5.1 mmol/L 3.8 4.0 3.8  Chloride 98 - 111 mmol/L 90(L) 93(L) 87(L)  CO2 22 - 32 mmol/L '26 27 26  '$ Calcium 8.9 - 10.3 mg/dL 8.7(L) 8.7(L) 8.5(L)  Total Protein 6.5 - 8.1 g/dL - - -  Total Bilirubin 0.3 - 1.2 mg/dL - - -  Alkaline Phos 38 - 126 U/L - - -  AST 15 - 41 U/L - - -  ALT 0 - 44 U/L - - -    Lab Results  Component Value Date   CALCIUM 8.7 (L) 02/23/2019   CAION 1.13 (L) 02/21/2019   PHOS 4.1 02/23/2019    No results found for: COLORURINE, APPEARANCEUR, LABSPEC, PHURINE, GLUCOSEU, HGBUR, BILIRUBINUR, KETONESUR, PROTEINUR, UROBILINOGEN, NITRITE, LEUKOCYTESUR    Component Value Date/Time   PHART 7.402 02/23/2019 0330   PCO2ART 46.4 02/23/2019 0330   PO2ART 111 (H) 02/23/2019 0330   HCO3 28.1 (H) 02/23/2019 0330   TCO2 30 02/21/2019 0340   O2SAT 98.5 02/23/2019 0330  Component Value Date/Time   IRON 75 02/03/2019 1117   TIBC 169 (L) 02/03/2019 1117   FERRITIN 1,429 (H) 02/03/2019 1117   IRONPCTSAT 44 (H) 02/03/2019 1117       ASSESSMENT/PLAN:     1.  End-stage renal disease on hemodialysis.  - Continue HD per TTS schedule.    2.  Acute hypoxemic respiratory failure.  Likely secondary to pneumonitis.  History of left pleural effusion status post left VATS in July 2020 with loculated basilar left hydro-with pneumothorax.  S/p Thoracentesis. abx per primary team   3.  Acute metabolic encephalopathy secondary to hypoglycemia.  Team is monitoring blood sugars closely.  On D10 for now and s/p increase in tube feeds   4.  Anemia of chronic kidney disease.   Monitor serial hemoglobins.  Restarted ESA.  Trend.  Is iron replete.   5.  Hyperphosphatemia.  Monitor on tube feeds.  On phoslo via tube.  Would change to nepro for tube feeds with hx hyperphosphatemia   6.  HTN. Continue  carvedilol.  Optimize volume with HD; no overt overload on exam.   Claudia Desanctis 02/23/2019 5:43 AM

## 2019-02-23 NOTE — Progress Notes (Addendum)
Pt remains hypertensive (SBP-190s-200s) despite versed, laying calmly in bed.  Have given all available meds at this point.  Spoke w/ Dr. Nelda Marseille, CCM MD who states he is fine w/ increasing PRN hydralazine if nephro in agreement.  Paged Dr. Royce Macadamia, Nephro, relayed the same, expressed concerns re: potential consequences if pts BP remains this high. Received verbal order for 1x dose 10mg  hydralazine.

## 2019-02-23 NOTE — Progress Notes (Signed)
Assisted tele visit to patient with daughter.  Thomas, Trenia Tennyson Renee, RN   

## 2019-02-23 NOTE — Progress Notes (Signed)
Pt. bp elevated as documented. Primary nurse made aware scheduled meds given as ordered. Will continue to monitor

## 2019-02-23 NOTE — Progress Notes (Signed)
Assisted tele visit to patient with daughter.  Guida Asman Joseandres, RN  

## 2019-02-23 NOTE — Progress Notes (Signed)
Pt getting dialysis at this time. Will attempt EEG at later time when schedule permits.

## 2019-02-23 NOTE — Progress Notes (Signed)
Spoke w/ pts dtr [name] to provide updates. [name] appreciative.

## 2019-02-23 NOTE — Consult Note (Addendum)
Requesting Physician: Dr. Nelda Marseille     Chief Complaint: Altered mental status   History obtained from: Patient and Chart     HPI:                                                                                                                                       Cole Nelson is a 63 y.o. 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 Patient is unable to provide history at this time. Called spouse x2, but unable to reach. Therefore history obtained from chart.   Patient was discharged to [facility] 8/11 and re-admitted on 8/16 for altered mental status and hypoglycemia.  He had an aspiration event in the ED requiring intubation for airway protection. On fentanyl and propofol for sedation. He was also started on Zosyn for aspiration pneumonitis. He has remained altered since admission prompting weaning of sedation. Propofol was switched to precedex yesterday and fentanyl was stopped without any improvement in mental status. Head CT ordered which showed atrophy and chronic ischemic changes, but no acute intracranial pathology.  MRI brain order, not performed yet. Precedex was then stopped this morning. However, he received Versed ~ 10 AM for agitation. He has been tolerating HD.    Past Medical History:  Diagnosis Date  . Anemia   . CHF (congestive heart failure) ([place])   . CVA (cerebral vascular accident) ([place])   . Diabetes mellitus without complication ([place])   . ESRD (end stage renal disease) on dialysis ([place])   . Gastroparesis due to DM ([place])   . Multiple sclerosis ([place])   . Recurrent left pleural effusion   . Tobacco abuse     Past Surgical History:  Procedure Laterality Date  . BIOPSY  01/31/2019   Procedure:  BIOPSY;  Surgeon: Jerene Bears, MD;  Location: [facility] ENDOSCOPY;  Service: Gastroenterology;;  . ENTEROSCOPY N/A 01/31/2019   Procedure: ENTEROSCOPY;  Surgeon: Jerene Bears, MD;  Location: [place];  Service: Gastroenterology;  Laterality: N/A;  . VIDEO ASSISTED THORACOSCOPY (VATS)/DECORTICATION      History reviewed. No pertinent family history. Social History:  reports previous alcohol use. He reports previous drug use. No history on file for tobacco.  Allergies:  Allergies  Allergen Reactions  . Baclofen     confusion  . Cefepime     Confusion, hives   . Insulins     Patient states his mind started going in and out.   Marland Kitchen Penicillins     Medications:  I reviewed home medications.    ROS:                                                                                                                                     Unable to assess due to altered mental status.    Examination:                                                                                                      General: thin and chronically-ill appearing Psych: altered, unable to assess  Eyes: No scleral injection HENT: intubated  Head: Normocephalic.  Cardiovascular: Tachycardic  Respiratory: intubated  GI: Soft.  No distension. There is no tenderness.  Skin: pressure ulcer of buttocks     Neurological Examination Mental Status: Awake. Does not respond to verbal or sternal stimuli. Does not follow commands.  Cranial Nerves: Unable to assess due to AMS.  Motor: Able to move all extremities spontaneously.  Right : Upper extremity   4/5    Left:     Upper extremity   4/5  Lower extremity   4/5     Lower extremity   4/5 Tone and bulk:normal tone throughout; no atrophy noted Sensory: Unable to assess due to AMS Deep Tendon Reflexes: Brisk patellar reflexes  bilaterally Plantars: Right: downgoing   Left: downgoing Cerebellar: Not tested Gait: Not tested   Lab Results: Basic Metabolic Panel: Recent Labs  Lab 02/20/19 0821  02/20/19 1901 02/21/19 0340 02/21/19 0440 02/22/19 0330 02/23/19 0322  NA 135   < > 135 131* 130* 132* 130*  K 3.9   < > 3.9 3.7 3.8 4.0 3.8  CL 93*  --  93*  --  87* 93* 90*  CO2 25  --  22  --  26 27 26   GLUCOSE 56*  --  84  --  161* 178* 128*  BUN 30*  --  35*  --  41* 26* 49*  CREATININE 6.43*  --  6.96*  --  7.31* 5.05* 6.20*  CALCIUM 10.0  --  9.8  --  8.5* 8.7* 8.7*  MG  --   --  2.2  --  2.2 2.0 2.1  PHOS  --   --  5.3*  --  7.2* 5.0* 4.1   < > = values in this interval not displayed.    CBC: Recent Labs  Lab 02/20/19 0821  02/20/19 1901 02/21/19 0340 02/21/19 0440 02/21/19 1436 02/22/19 0330 02/23/19 0322  WBC 10.1  --  12.3*  --  17.8*  --  14.1* 12.5*  NEUTROABS 9.2*  --  11.3*  --   --   --   --   --   HGB 12.5*   < > 12.4* 9.9* 8.1* 9.0* 8.2* 7.9*  HCT 42.1   < > 39.3 29.0* 26.4* 28.8* 27.0* 24.7*  MCV 83.0  --  82.2  --  82.8  --  83.6 80.2  PLT 335  --  226  --  217  --  194 189   < > = values in this interval not displayed.    Coagulation Studies: Recent Labs    02/20/19 1901  LABPROT 13.4  INR 1.0    Imaging: Dg Chest Port 1 View  Result Date: 02/23/2019 CLINICAL DATA:  Check endotracheal tube placement EXAM: PORTABLE CHEST 1 VIEW COMPARISON:  02/22/2019 FINDINGS: Cardiac shadow is stable. Endotracheal tube and gastric catheter as well as a right jugular line are again seen and stable. The lungs are well aerated bilaterally. There is a large left-sided hydropneumothorax slightly improved from the previous exam. There is mild interstitial change bilaterally consistent with underlying edema. No new focal abnormality is seen. IMPRESSION: Persistent left hydropneumothorax although slightly improved when compared with the prior exam. Persistent parenchymal edema. Electronically  Signed   By: Inez Catalina M.D.   On: 02/23/2019 07:28   Dg Chest Port 1 View  Result Date: 02/22/2019 CLINICAL DATA:  Acute respiratory failure. EXAM: PORTABLE CHEST 1 VIEW COMPARISON:  One day prior FINDINGS: Endotracheal tube terminates 6.8 cm above carina. Nasogastric tube extends beyond the inferior aspect of the film. Right internal jugular line unchanged. Normal heart size. Moderate left-sided hydropneumothorax is not significantly changed. No mediastinal shift. No right-sided pneumothorax. Suspect trace right pleural fluid. Given differences in technique, similar diffuse interstitial thickening. IMPRESSION: No significant change since one day prior. Left-sided hydropneumothorax, moderate and similar. Multifocal interstitial opacities, similar. Electronically Signed   By: Abigail Miyamoto M.D.   On: 02/22/2019 08:00   I have reviewed the above imaging.    ASSESSMENT AND PLAN   Cole Nelson is a 63 y.o. 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  His hospital course has been complicated by uncontrolled hypertension.   Impression:  1. Acute toxic/metabolic encephalopathy  - Likely secondary to prolonged sedation effects in the setting of ESRD. No other metabolic abnormalities noted. Other etiologies include hypertensive encephalopathy, CVA (acute BP drop yesterday), underlying infection, seizure and uremic encephaloapthy  (low suspicion given he has been on HD without missing any sessions).   2. MS - no records in chart, follows up at [facility]. On glatiramer at home.   3. Hypoglycemia - resolved    Recommendations:  - Continue to monitor mental status over the next 24-48 hours off sedation  - MRI brain w/o contrast to r/o stroke  - EEG to r/o  seizures  - Monitor electrolytes and replete as needed  - Needs better BP control, anti-hypertensive regimen per nephrology   Final recommendations per neurolory attending, Dr. Lorraine Lax.   Welford Roche, MD  Internal Medicine PGY-3  P [phone]   NEUROHOSPITALIST ADDENDUM Performed a face to face diagnostic evaluation.   I have reviewed the contents of history and physical  exam as documented by PA/ARNP/Resident and agree with above documentation.  I have discussed and formulated the above plan as documented. Edits to the note have been made as needed.   Patient with multiple sclerosis, CVA in 08/27/1995 with the rest of the left hemiparesis admitted in the month of [facility] for encephalopathy in the setting of hypoglycemia.  Patient is intubated and on sedation.  Neurology was consulted as sedation was being moved however patient remained severely encephalopathic.  Patient underwent dialysis earlier this evening and according to nurse patient is definitely become more awake and is requiring intermittent sedation for agitation.  On assessment patient is having semipurposeful movements in all 4 extremities.  Opens eyes spontaneously however does not track or follow commands.  Pupils are equal and reactive.  Reflexes are brisk bilateral lower extremities.  Impression Encephalopathy likely from sedation, recommend main wean sedation as much as possible. MRI brain ordered, results pending EEG  We will continue to follow   Karena Addison Dailyn Kempner MD [facility] [phone]   If 7pm to 7am, please call on call as listed on AMION.

## 2019-02-23 NOTE — Progress Notes (Signed)
Pt originally scheduled for MRI @1530 . Postponed due to delay in MRI department. Rescheduled for 1700.  Received call from MRI that MRI will be delayed until night shift.

## 2019-02-23 NOTE — Progress Notes (Signed)
NAME:  Cole Nelson, MRN:  [id number], DOB:  [birth year], LOS: 3 ADMISSION DATE:  02/20/2019, CONSULTATION DATE:  02/20/2019 REFERRING MD:  Dr. Rogene Houston, ER [facility], CHIEF COMPLAINT: Altered mental status  Brief History   63 yo male from [place] [facility] with altered mental status and found to have blood sugar of 31.  Had vomiting and aspiration event, and required intubation.  Was in hospital 7/23 to 8/12 with vomiting from gastroparesis.  Past Medical History  ESRD, MS, DM, CVA, Gastroparesis, HTN, Lt pleural effusion s/p VATS January 18, 2019  Significant Hospital Events   8/15 Admit  Consults:  Renal 8/15 ESRD  Procedures:  ETT 8/15 >> RIJ 8/16 >>  Significant Diagnostic Tests:  CT head 8/15 >> atrophy and chronic ischemic changes CT chest 8/15 >> Persistent large left-sided hydropneumothorax with improved aeration within the left upper and left lower lobes.  New extensive pulmonary opacities throughout both lungs, with large areas of consolidation involving the right lower lobe, right middle lobe, and right upper lobe. These findings are concerning for multifocal pneumonia versus aspiration. Moderate size right-sided pleural effusion which may be partially loculated.  Micro Data:  COVID 8/15 >> negative Blood 8/15 >> Sputum 8/15 >> Urine 8/15 >>   Antimicrobials:  Zosyn 8/15 >>  Vancomycin 8/15 >> 8/16  Interim history/subjective:  Less agitated overnight Febrile to 100.9  Objective   Blood pressure (!) 213/103, pulse (!) 111, temperature 100 F (37.8 C), temperature source Oral, resp. rate (!) 22, height 5\' 11"  (1.803 m), weight 59 kg, SpO2 100 %.    Vent Mode: PSV;CPAP FiO2 (%):  [40 %] 40 % Set Rate:  [18 bmp] 18 bmp Vt Set:  [600 mL] 600 mL PEEP:  [5 cmH20] 5 cmH20 Pressure Support:  [5 cmH20] 5 cmH20 Plateau Pressure:  [16 cmH20-18 cmH20] 16 cmH20   Intake/Output Summary (Last 24 hours) at 02/23/2019 0917 Last data filed at 02/23/2019 0800 Gross per 24 hour   Intake 2295.48 ml  Output -  Net 2295.48 ml   Filed Weights   02/22/19 0500 02/23/19 0328 02/23/19 0740  Weight: 60.4 kg 59.7 kg 59 kg   Examination:  General - Acute on chronically ill appearing male, NAD HEENT: [place]/AT, PERRL, EOM-I and MMM Cardiac - RRR, Nl S1/S2 and -M/R/G Chest - Diminished bilaterally Abdomen - Soft, NT, ND and +BS Extremities - decreased muscle bulk Skin - Rt buttock stage 2 pressure ulcer Neuro - Withdraws to pain but not following commands  I reviewed CXR myself, hydropneumothorax present and ETT is in a good position  Discussed with bedside RN.  Resolved Hospital Problem list     Assessment & Plan:   Acute hypoxic respiratory failure with altered mental status and compromised airway from hypoglycemia complicated by aspiration pneumonitis. Hx of Lt pleural effusion s/p Lt VATS July 2020 now with loculated basilar Lt hydropneumothorax. Hx of asthma. RT parapneumonic effusion? loculated - Maintain on PS as tolerated - Continue Zosyn 3/8, Vanc d/ced 8/16 - PRN BDs - R thora done by IR, protein 3.4/8 (transudative) and LDH 159/148 (exudative) fluid is exudative, likely a parapneumonic effusion.  Glucose 125, 2300 WBC with 56% PMNs, not concerned for empyema at this point, no further interventions.  Lung likely trapped.  Acute metabolic encephalopathy with hypoglycemia. Hx of MS, CVA. - RASS goal 0 - D/C precedex, minimize fentanyl - Will order an MRI, may need sedation - EEG ordered - Neurology consult called - Continue ASA - Hold outpt prozac, neurontin,  copaxone  Hx of DM. - Hold tradjenta, levemir - D/C D10 and start TF - Start sensitive SSI with no basal insulin  ESRD in HD - HD per renal now TTS - BMET in AM - Replace electrolytes as indicated  Severe protein calorie malnutrition. - TF per nutrition  Rt buttock pressure ulcer. - Present prior to admission - Wound care  Hx of HTN, HLD. - Hold outpt coreg, lipitor, catapres,  hydralazine, lisinopril  D/C sedation except for MRI needs, EEG ordered, neurology consult called, no extubation until mental status improves, pending neurology work up will consider trach/peg vs comfort.  Best practice:  Diet: tube feeds DVT prophylaxis: lovenox GI prophylaxis: Pepcid Mobility: bed rest Code Status: full code Family communication: attempted to contact pt's wife >> no answer at listed numbers Disposition: ICU  The patient is critically ill with multiple organ systems failure and requires high complexity decision making for assessment and support, frequent evaluation and titration of therapies, application of advanced monitoring technologies and extensive interpretation of multiple databases.   Critical Care Time devoted to patient care services described in this note is  33  Minutes. This time reflects time of care of this signee Dr Jennet Maduro. This critical care time does not reflect procedure time, or teaching time or supervisory time of PA/NP/Med student/Med Resident etc but could involve care discussion time.  Rush Farmer, M.D. [facility] Pulmonary/Critical Care Medicine. Pager: [phone]. After hours pager: [phone].

## 2019-02-23 NOTE — Progress Notes (Signed)
Pt done w/ HD. Spoke w/ RT about flipping back over to PS. Per RT, will wait until pt back from MRI.

## 2019-02-23 NOTE — Progress Notes (Addendum)
Precedex turned off this AM as pt minimally responsive, attempting to hold any sedating meds.  HD started.  Pts SBP has been over 200 since 845.  10mg  hydralazine given w/ no improvement.  Instructed by Dr. Royce Macadamia, nephrology to hold any further iv hydralazine and to give home dose of catapres which nephro MD will order. All relayed to Dr. Nelda Marseille who states ok w/ this plan.   1015: Pt continues to remain hypertensive and agitated. Spoke w/ Dr. Royce Macadamia and Gaylyn Lambert, CCM NP.  Versed given and pt switched to full support on vent for now.  Will switch back over after HD. Will continue to monitor BP closely.

## 2019-02-23 NOTE — Progress Notes (Signed)
 Nutrition Follow-up  DOCUMENTATION CODES:   Severe malnutrition in context of chronic illness  INTERVENTION:   Tube Feeding:  Continue Vital 1.5 at 45 ml/hr Continue Pro-Stat 30 mL BID   Current TF at current goal rate provides 1080 mg of phosphorus, 2160 mg of potassium. Appropriate for HD pt. Current potassium and phosphorus well managed. Do not recommend switching to Nepro at this time; Nepro is a more calorically dense formula and higher in fat. Switching to this formula would provide less carbohydrate which would not be beneficial in pt with hypoglycemia (pt has been on D10 infusion for hypoglycemia) and may not be well tolerated in patient with gastroparesis admitted with vomiting (higher in fat and fiber than current formula). Will continue to monitor electrolytes closely and make adjustments to TF formula and rate as indicated  Add Rena-vit daily  NUTRITION DIAGNOSIS:   Severe Malnutrition related to chronic illness(ESRD) as evidenced by severe muscle depletion, severe fat depletion.  Being addressed via TF   GOAL:   Patient will meet greater than or equal to 90% of their needs  Met  MONITOR:   Vent status, TF tolerance, Labs, I & O's, Skin  REASON FOR ASSESSMENT:   Ventilator, Consult Enteral/tube feeding initiation and management  ASSESSMENT:   63 yo male admitted from SNF with AMS, glucose 31. Vomited and aspirated, requiring intubation. PMH includes gastroparesis, ESRD-HD, MS, DM, CVA, HTN, L pleural effusion s/p VATS 01/18/2019.  Patient is currently intubated on ventilator support MV: 10.5 L/min Temp (24hrs), Avg:99.6 F (37.6 C), Min:98.6 F (37 C), Max:100.9 F (38.3 C)  Propofol: None  Vital 1.5 at 45 ml/hr, Pro-Stat 30 mL BID D10 at 50 ml/hr  Current TF at current goal rate provides 1080 mg of phosphorus, 2160 mg of potassium. Appropriate for HD pt. Current potassium and phosphorus well managed. Do not recommend switching to Nepro at this time;  Nepro is a more calorically dense formula and higher in fat. Switching to this formula would provide less carbohydrate which would not be beneficial in pt with hypoglycemia (pt has been on D10 infusion for hypoglycemia) and may not be well tolerated in patient with gastroparesis admitted with vomiting (higher in fat and fiber than current formula). Will continue to monitor electrolytes closely and make adjustments to TF formula and rate as indicated  Unsure of dry weight; current wt 59 kg; admission weight 61.2 kg. Lowest post HD weight 56.8 kg.   Labs:  Phosphorus 4.1 (wdl), potassium 3.8 (wdl), sodium 130 (L), CBGs 120-191 Meds: Phoslyra q 6 hours   Diet Order:   Diet Order            Diet NPO time specified  Diet effective now              EDUCATION NEEDS:   Not appropriate for education at this time  Skin:  Skin Assessment: Reviewed RN Assessment Skin Integrity Issues:: Stage II Stage II: coccyx  Last BM:  8/18  Height:   Ht Readings from Last 1 Encounters:  02/20/19 '5\' 11"'$  (1.803 m)    Weight:   Wt Readings from Last 1 Encounters:  02/23/19 59 kg    Ideal Body Weight:  78.2 kg  BMI:  Body mass index is 18.14 kg/m.  Estimated Nutritional Needs:   Kcal:  1800  Protein:  90-110 gm  Fluid:  1 L + UOP    Auryn Paige MS, RDN, LDN, CNSC [phone] Pager  [phone] Weekend/On-Call Pager

## 2019-02-23 NOTE — Progress Notes (Signed)
Dr. Royce Macadamia telephoned and made aware of pt. bp's as documented and ordered medications that has been given up to this time. Per Dr. Royce Macadamia hold on additional dose  IV hydralazine while on HD tx at this time, MD will put in order for clonidine, and for HD nurse to continue with current HD orders. Will continue to monitor. Primary nurse Janett Billow made aware of events.

## 2019-02-23 NOTE — Progress Notes (Signed)
Pt now resting calmly after versed.  BP remains elevated, last reading: 188/78.  Spoke w/ Dr. Royce Macadamia, Nephrology [id number], resting comfortably, next scheduled BP dose not until 1600, can I give 10 more mg of hydralazine.  Received verbal orders from Dr. Royce Macadamia for 0.1mg  clonidine x1.

## 2019-02-24 ENCOUNTER — Inpatient Hospital Stay (HOSPITAL_COMMUNITY): Payer: Medicare PPO

## 2019-02-24 DIAGNOSIS — I10 Essential (primary) hypertension: Secondary | ICD-10-CM

## 2019-02-24 LAB — BASIC METABOLIC PANEL
Anion gap: 12 (ref 5–15)
BUN: 29 mg/dL — ABNORMAL HIGH (ref 8–23)
CO2: 27 mmol/L (ref 22–32)
Calcium: 9.1 mg/dL (ref 8.9–10.3)
Chloride: 94 mmol/L — ABNORMAL LOW (ref 98–111)
Creatinine, Ser: 3.75 mg/dL — ABNORMAL HIGH (ref 0.61–1.24)
GFR calc Af Amer: 19 mL/min — ABNORMAL LOW (ref >60)
GFR calc non Af Amer: 16 mL/min — ABNORMAL LOW (ref >60)
Glucose, Bld: 198 mg/dL — ABNORMAL HIGH (ref 70–99)
Potassium: 3.5 mmol/L (ref 3.5–5.1)
Sodium: 133 mmol/L — ABNORMAL LOW (ref 135–145)

## 2019-02-24 LAB — MAGNESIUM: Magnesium: 2.2 mg/dL (ref 1.7–2.4)

## 2019-02-24 LAB — POCT I-STAT 7, (LYTES, BLD GAS, ICA,H+H)
Acid-Base Excess: 6 mmol/L — ABNORMAL HIGH (ref 0.0–2.0)
Bicarbonate: 28.8 mmol/L — ABNORMAL HIGH (ref 20.0–28.0)
Calcium, Ion: 1.23 mmol/L (ref 1.15–1.40)
HCT: 25 % — ABNORMAL LOW (ref 39.0–52.0)
Hemoglobin: 8.5 g/dL — ABNORMAL LOW (ref 13.0–17.0)
O2 Saturation: 97 %
Patient temperature: 98.6
Potassium: 3.4 mmol/L — ABNORMAL LOW (ref 3.5–5.1)
Sodium: 132 mmol/L — ABNORMAL LOW (ref 135–145)
TCO2: 30 mmol/L (ref 22–32)
pCO2 arterial: 35.9 mmHg (ref 32.0–48.0)
pH, Arterial: 7.513 — ABNORMAL HIGH (ref 7.350–7.450)
pO2, Arterial: 79 mmHg — ABNORMAL LOW (ref 83.0–108.0)

## 2019-02-24 LAB — CBC
HCT: 25.7 % — ABNORMAL LOW (ref 39.0–52.0)
Hemoglobin: 8 g/dL — ABNORMAL LOW (ref 13.0–17.0)
MCH: 25.4 pg — ABNORMAL LOW (ref 26.0–34.0)
MCHC: 31.1 g/dL (ref 30.0–36.0)
MCV: 81.6 fL (ref 80.0–100.0)
Platelets: 204 10*3/uL (ref 150–400)
RBC: 3.15 MIL/uL — ABNORMAL LOW (ref 4.22–5.81)
RDW: 15 % (ref 11.5–15.5)
WBC: 9.9 10*3/uL (ref 4.0–10.5)
nRBC: 0 % (ref 0.0–0.2)

## 2019-02-24 LAB — GLUCOSE, CAPILLARY
Glucose-Capillary: 103 mg/dL — ABNORMAL HIGH (ref 70–99)
Glucose-Capillary: 116 mg/dL — ABNORMAL HIGH (ref 70–99)
Glucose-Capillary: 158 mg/dL — ABNORMAL HIGH (ref 70–99)
Glucose-Capillary: 180 mg/dL — ABNORMAL HIGH (ref 70–99)
Glucose-Capillary: 198 mg/dL — ABNORMAL HIGH (ref 70–99)
Glucose-Capillary: 84 mg/dL (ref 70–99)

## 2019-02-24 LAB — PHOSPHORUS: Phosphorus: 2.3 mg/dL — ABNORMAL LOW (ref 2.5–4.6)

## 2019-02-24 MED ORDER — PIPERACILLIN-TAZOBACTAM 3.375 G IVPB
3.3750 g | Freq: Two times a day (BID) | INTRAVENOUS | Status: DC
  Administered 2019-02-24 – 2019-02-25 (×3): 3.375 g via INTRAVENOUS
  Filled 2019-02-24 (×3): qty 50

## 2019-02-24 MED ORDER — CHLORHEXIDINE GLUCONATE 0.12 % MT SOLN
15.0000 mL | Freq: Two times a day (BID) | OROMUCOSAL | Status: DC
  Administered 2019-02-24 – 2019-03-01 (×10): 15 mL via OROMUCOSAL
  Filled 2019-02-24 (×10): qty 15

## 2019-02-24 MED ORDER — CHLORHEXIDINE GLUCONATE CLOTH 2 % EX PADS
6.0000 | MEDICATED_PAD | Freq: Every day | CUTANEOUS | Status: DC
  Administered 2019-02-24 – 2019-02-27 (×3): 6 via TOPICAL

## 2019-02-24 MED ORDER — ORAL CARE MOUTH RINSE
15.0000 mL | Freq: Two times a day (BID) | OROMUCOSAL | Status: DC
  Administered 2019-02-24 – 2019-03-01 (×10): 15 mL via OROMUCOSAL

## 2019-02-24 NOTE — Progress Notes (Signed)
[facility]    NEPHROLOGY PROGRESS NOTE  SUBJECTIVE:  Agitation overnight with bath and received versed for same.  Yesterday pressures peaked over 200 after sedation weaned.  Clonidine was added first at 0.1 mg TID then increased to 0.2 mg TID (compared with home 0.3 mg BID).  He was also started on home hydralazine at 100 mg TID.  Amlodipine was also added at 5 mg daily (as opposed to 5 mg BID per home dose).  He has been following commands.  Had HD on 8/18 with 2 kg UF.  Nadir pressure 118/52 with a few readings 120 and 130's and most recently 465-035'W systolic.  Per nursing they may try to extubate today but has had continued secretion.   Review of systems:  Unable to obtain 2/2 intubation   OBJECTIVE:  Vitals:   02/24/19 0500 02/24/19 0515  BP: (!) 153/81 (!) 163/72  Pulse: 94 91  Resp: 18 (!) 22  Temp:    SpO2: 100% 100%    Intake/Output Summary (Last 24 hours) at 02/24/2019 0544 Last data filed at 02/24/2019 0400 Gross per 24 hour  Intake 2141.23 ml  Output 2000 ml  Net 141.23 ml      General: adult male intubated   HEENT: MMM [place] AT anicteric sclera CV:  RRR no rub  Lungs:  coarse breath sounds bilaterally   Abd:  abd SNT/ND  Extremities: no lower extremity edema  Neuro: no sedation running; follows simple commands Access: left upper extremity AV graft with a good thrill and bruit.    MEDICATIONS:  . amLODipine  5 mg Oral Daily  . aspirin  81 mg Oral Daily  . calcium acetate (Phos Binder)  667 mg Per Tube Q6H  . carvedilol  25 mg Oral BID WC  . chlorhexidine gluconate (MEDLINE KIT)  15 mL Mouth Rinse BID  . Chlorhexidine Gluconate Cloth  6 each Topical Daily  . cloNIDine  0.2 mg Oral TID  . darbepoetin (ARANESP) injection - DIALYSIS  60 mcg Intravenous Q Tue-HD  . docusate  100 mg Oral BID  . docusate  100 mg Oral Daily  . enoxaparin (LOVENOX) injection  30 mg Subcutaneous Q24H  . feeding supplement (PRO-STAT SUGAR FREE 64)  30 mL Per Tube BID   . hydrALAZINE  100 mg Oral Q8H  . insulin aspart  1-3 Units Subcutaneous Q4H  . mouth rinse  15 mL Mouth Rinse 10 times per day  . midazolam  2 mg Intravenous Once  . multivitamin  1 tablet Oral QHS  . pantoprazole sodium  40 mg Per Tube Daily       LABS:   CBC Latest Ref Rng & Units 02/24/2019 02/24/2019 02/23/2019  WBC 4.0 - 10.5 K/uL 9.9 - 12.5(H)  Hemoglobin 13.0 - 17.0 g/dL 8.0(L) 8.5(L) 7.9(L)  Hematocrit 39.0 - 52.0 % 25.7(L) 25.0(L) 24.7(L)  Platelets 150 - 400 K/uL 204 - 189    CMP Latest Ref Rng & Units 02/24/2019 02/24/2019 02/23/2019  Glucose 70 - 99 mg/dL 198(H) - 128(H)  BUN 8 - 23 mg/dL 29(H) - 49(H)  Creatinine 0.61 - 1.24 mg/dL 3.75(H) - 6.20(H)  Sodium 135 - 145 mmol/L 133(L) 132(L) 130(L)  Potassium 3.5 - 5.1 mmol/L 3.5 3.4(L) 3.8  Chloride 98 - 111 mmol/L 94(L) - 90(L)  CO2 22 - 32 mmol/L 27 - 26  Calcium 8.9 - 10.3 mg/dL 9.1 - 8.7(L)  Total Protein 6.5 - 8.1 g/dL - - -  Total Bilirubin 0.3 - 1.2  mg/dL - - -  Alkaline Phos 38 - 126 U/L - - -  AST 15 - 41 U/L - - -  ALT 0 - 44 U/L - - -    Lab Results  Component Value Date   CALCIUM 9.1 02/24/2019   CAION 1.23 02/24/2019   PHOS 2.3 (L) 02/24/2019    No results found for: COLORURINE, APPEARANCEUR, LABSPEC, PHURINE, GLUCOSEU, HGBUR, BILIRUBINUR, KETONESUR, PROTEINUR, UROBILINOGEN, NITRITE, LEUKOCYTESUR    Component Value Date/Time   PHART 7.513 (H) 02/24/2019 0337   PCO2ART 35.9 02/24/2019 0337   PO2ART 79.0 (L) 02/24/2019 0337   HCO3 28.8 (H) 02/24/2019 0337   TCO2 30 02/24/2019 0337   O2SAT 97.0 02/24/2019 0337       Component Value Date/Time   IRON 75 02/03/2019 1117   TIBC 169 (L) 02/03/2019 1117   FERRITIN 1,429 (H) 02/03/2019 1117   IRONPCTSAT 44 (H) 02/03/2019 1117       ASSESSMENT/PLAN:     1.  End-stage renal disease on hemodialysis.  - Continue HD per TTS schedule.  Next tx 8/20.   2.  Acute hypoxemic respiratory failure.  Likely secondary to pneumonitis.  History of left pleural  effusion status post left VATS in July 2020 with loculated basilar left hydro-with pneumothorax.  S/p Thoracentesis. abx per primary team   3.  HTN. Continue carvedilol. Continue clonidine 0.2 mg TID (compared with home 0.3 mg BID).  Continue hydralazine 100 mg TID.  Continue amlodipine 5 mg daily (home dose is amlodipine 5 mg BID per charting).  Next increase would be the amlodipine back to home dose    4.  Acute metabolic encephalopathy secondary to hypoglycemia.  Team is monitoring blood sugars closely.  mental status improving.   5.  Anemia of chronic kidney disease.   Monitor serial hemoglobins.  Restarted ESA.  Trend.  Is iron replete.   6. hx Hyperphosphatemia.  On phoslo via tube - discontinue for now with mild hypophos and follow.     Claudia Desanctis 02/24/2019 5:44 AM

## 2019-02-24 NOTE — Significant Event (Cosign Needed)
02/23/2021 10 AM  Called wife [name] Holl concerning extubation.  Spoke with her multiple sclerosis may be a factor in his not being liberated from mechanical ventilatory support.  We also discussed the need for early tracheostomy if he fails extubation and is reintubated.  The complications of multiple re-intubations were discussed.  She expressed a desire to look at his current MRI.  She has been following his MRIs as an outpatient.  She is agreeable to reintubation and early tracheostomy with ongoing discussions between her and Dr. Nelda Marseille.  Richardson Landry Zaniah Titterington ACNP [facility] Shape PCCM Pager [phone] till 1 pm If no answer page [phone] 02/24/2019, 9:59 AM

## 2019-02-24 NOTE — Progress Notes (Addendum)
Reason for consult: Altered mental status  Subjective: No acute events overnight.  Off sedation for 24 hours with marked improvement in mentation.  Requiring Versed intermittently for agitation/anxiety. Remains intubated, but seems he was extubated after seen this AM.    ROS: negative except above  Examination  Vital signs in last 24 hours: Temp:  [98.1 F (36.7 C)-101 F (38.3 C)] 98.1 F (36.7 C) (08/19 0800) Pulse Rate:  [84-119] 95 (08/19 1015) Resp:  [15-29] 20 (08/19 0755) BP: (110-213)/(43-110) 180/86 (08/19 0755) SpO2:  [84 %-100 %] 100 % (08/19 0755) FiO2 (%):  [40 %-97 %] 97 % (08/19 1015) Weight:  [57.1 kg-60.3 kg] 60.3 kg (08/19 0346)  General: lying in bed watching TV  CVS: pulse-normal rate and rhythm RS: breathing comfortably on vent  Extremities: normal   Neuro: MS: Alert, able to follow commands and answering questions appropriately by nodding yes or no CN: pupils equal and reactive,  EOMI, face symmetric, normal sensation over face Motor: 5/5 strength in all 4 extremities Reflexes: brisk patellar and biceps reflexes  Coordination: not tested  Gait: not tested  Basic Metabolic Panel: Recent Labs  Lab 02/20/19 1901  02/21/19 0440 02/22/19 0330 02/23/19 0322 02/24/19 0337 02/24/19 0347  NA 135   < > 130* 132* 130* 132* 133*  K 3.9   < > 3.8 4.0 3.8 3.4* 3.5  CL 93*  --  87* 93* 90*  --  94*  CO2 22  --  26 27 26   --  27  GLUCOSE 84  --  161* 178* 128*  --  198*  BUN 35*  --  41* 26* 49*  --  29*  CREATININE 6.96*  --  7.31* 5.05* 6.20*  --  3.75*  CALCIUM 9.8  --  8.5* 8.7* 8.7*  --  9.1  MG 2.2  --  2.2 2.0 2.1  --  2.2  PHOS 5.3*  --  7.2* 5.0* 4.1  --  2.3*   < > = values in this interval not displayed.    CBC: Recent Labs  Lab 02/20/19 0821  02/20/19 1901  02/21/19 0440 02/21/19 1436 02/22/19 0330 02/23/19 0322 02/24/19 0337 02/24/19 0347  WBC 10.1  --  12.3*  --  17.8*  --  14.1* 12.5*  --  9.9  NEUTROABS 9.2*  --  11.3*  --   --    --   --   --   --   --   HGB 12.5*   < > 12.4*   < > 8.1* 9.0* 8.2* 7.9* 8.5* 8.0*  HCT 42.1   < > 39.3   < > 26.4* 28.8* 27.0* 24.7* 25.0* 25.7*  MCV 83.0  --  82.2  --  82.8  --  83.6 80.2  --  81.6  PLT 335  --  226  --  217  --  194 189  --  204   < > = values in this interval not displayed.     Coagulation Studies: No results for input(s): LABPROT, INR in the last 72 hours.  Imaging Reviewed:  MRI brain: Limited by motion artifact, but no acute intracranial abnormalities.  Moderately advanced cerebral white matter changes consistent with MS, remote lacunar infarcts on bilateral thalami and pons.  No active demyelination.   ASSESSMENT AND PLAN   Cole Nelson is a 63 y.o. 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  His hospital course has been complicated by uncontrolled hypertension.   Impression/Recommendations:  1. Acute toxic/metabolic encephalopathy  - Resolved after 24 hours off sedation.  Given no acute intracranial pathology on imaging and resolution of encephalopathy likely affect off sedation  2. MS - Follows up at [facility]. On glatiramer at home. Being held in the setting of infection.   3. Hypoglycemia - resolved   4. Choreoathetosis of head  upper trunk- possible related to hypoglycemia, will continue to monitor as this usually improves over time   Final recommendations per neurology attending, Dr. Lorraine Lax.   Welford Roche, MD  Internal Medicine PGY-3  P [phone]   NEUROHOSPITALIST ADDENDUM Performed a face to face diagnostic evaluation.   I have reviewed the contents of history and physical exam as documented by PA/ARNP/Resident and agree with above documentation.  I have discussed and formulated the  above plan as documented. Edits to the note have been made as needed.  Patient extubated and is awake, oriented to person place and time.  He is having involuntary head and neck movements while speaking, this is not uncomfortable to the patient.  MRI brain is negative for any acute findings.   Karena Addison Falisa Lamora MD [facility] [id number]   If 7pm to 7am, please call on call as listed on AMION.

## 2019-02-24 NOTE — Progress Notes (Addendum)
NAME:  Cole Nelson, MRN:  [id number], DOB:  [birth year], LOS: 4 ADMISSION DATE:  02/20/2019, CONSULTATION DATE:  02/20/2019 REFERRING MD:  Dr. Rogene Houston, ER [facility], CHIEF COMPLAINT: Altered mental status  Brief History   63 yo male from [place] [facility] with altered mental status and found to have blood sugar of 31.  Had vomiting and aspiration event, and required intubation.  Was in hospital 7/23 to 8/12 with vomiting from gastroparesis.  Past Medical History  ESRD, MS, DM, CVA, Gastroparesis, HTN, Lt pleural effusion s/p VATS January 18, 2019  Significant Hospital Events   8/15 Admit  Consults:  Renal 8/15 ESRD  Procedures:  ETT 8/15 >> RIJ 8/16 >>  Significant Diagnostic Tests:  CT head 8/15 >> atrophy and chronic ischemic changes CT chest 8/15 >> Persistent large left-sided hydropneumothorax with improved aeration within the left upper and left lower lobes.  New extensive pulmonary opacities throughout both lungs, with large areas of consolidation involving the right lower lobe, right middle lobe, and right upper lobe. These findings are concerning for multifocal pneumonia versus aspiration. Moderate size right-sided pleural effusion which may be partially Loculated. 02/23/2019 MRI brain extremely poor study.  Findings consistent with MS  Micro Data:  COVID 8/15 >> negative Blood 8/15 >> 1 of 2 blood cultures with coag negative staph>> Sputum 8/15 >> prove candidemia glabrata Urine 8/15 >> does not make urine Pleural fluid 02/21/2019>>  Antimicrobials:  Zosyn 8/15 >>  Vancomycin 8/15 >> 8/16  Interim history/subjective:  Tolerated pressure support and CPAP for approximately 20 minutes centimeters placed back on full ventilatory support for anxiety  Objective   Blood pressure (!) 180/86, pulse (!) 103, temperature 98.1 F (36.7 C), temperature source Oral, resp. rate 20, height 5\' 11"  (1.803 m), weight 60.3 kg, SpO2 100 %.    Vent Mode: PRVC FiO2 (%):  [40 %-70 %] 40 % Set  Rate:  [18 bmp] 18 bmp Vt Set:  [600 mL] 600 mL PEEP:  [5 cmH20] 5 cmH20 Pressure Support:  [5 cmH20] 5 cmH20 Plateau Pressure:  [18 cmH20-27 cmH20] 18 cmH20   Intake/Output Summary (Last 24 hours) at 02/24/2019 0848 Last data filed at 02/24/2019 0800 Gross per 24 hour  Intake 1646.07 ml  Output 2000 ml  Net -353.93 ml   Filed Weights   02/23/19 0740 02/23/19 1147 02/24/19 0346  Weight: 59 kg 57.1 kg 60.3 kg   Examination:  General - Acute on chronically ill appearing male, NAD HEENT: [place]/AT, PERRL, EOM-I and MMM Cardiac - RRR, Nl S1/S2 and -M/R/G Chest - Diminished bilaterally Abdomen - Soft, NT, ND and +BS Extremities - decreased muscle bulk Skin - Rt buttock stage 2 pressure ulcer Neuro - Withdraws to pain but not following commands  02/24/2019 chest x-ray reviewed support apparatus in appropriate position.  Worsening airspace disease on the right no significant change in left hydropneumothorax.  Resolved Hospital Problem list     Assessment & Plan:   Acute hypoxic respiratory failure with altered mental status and compromised airway from hypoglycemia complicated by aspiration pneumonitis. Hx of Lt pleural effusion s/p Lt VATS July 2020 now with loculated basilar Lt hydropneumothorax. Hx of asthma. RT parapneumonic effusion? loculated Tolerated pressure support 5 cm 5 of PEEP for approximately 20 minutes and then became anxious.  Questionable for masses effectiveness anxiety Continue Zosyn day 4 of 8 coag negative staph Unco-dilators R thora done by IR, protein 3.4/8 (transudative) and LDH 159/148 (exudative) fluid is exudative, likely a parapneumonic effusion.  Glucose 125,  2300 WBC with 56% PMNs, not concerned for empyema at this point, no further interventions.  Lung likely trapped. Chest x-ray shows left hydropneumothorax less than 10% on 02/24/2011 May need a trial of extubation and if reintubated consider tracheostomy in the setting of ongoing chronic illness coupled  with multiple sclerosis.  Acute metabolic encephalopathy with hypoglycemia. Hx of MS, CVA. Minimize sedation MRI was completed was suggestive of MS Appreciate neurology's input Continue to hold Prozac Neurontin   Hx of DM.\ CBG (last 3)  Recent Labs    02/24/19 0012 02/24/19 0357 02/24/19 0740  GLUCAP 198* 180* 116*    Sliding-scale insulin protocol Tube feeding  ESRD in HD Hemodialysis on Tuesday Thursdays and Saturdays Follow electrolytes and replete as needed   Severe protein calorie malnutrition. Continue tube feeding  Rt buttock pressure ulcer. This was present on admission Wound care  Hx of HTN, HLD. - Hold outpt coreg, lipitor, catapres, hydralazine, lisinopril  02/23/2019 much more awake.  Poor quality MRI due to artifact of motion.  Findings consistent with MS without acute abnormality  Best practice:  Diet: tube feeds DVT prophylaxis: lovenox GI prophylaxis: Pepcid Mobility: bed rest Code Status: full code Family communication: attempted to contact pt's wife >> no answer at listed numbers Disposition: ICU  App cct 30 min  Richardson Landry Minor ACNP [facility] PCCM Pager [phone] till 1 pm If no answer page [phone] 02/24/2019, 8:48 AM  Attending note:  63 year old male with history of MS who presents to PCCM with respiratory failure due to AMS who is much more alert and interactive today with coarse BS on exam.  I reviewed CXR myself, ETT is in a good position.  Discussed with PCCM-NP.  Will begin PS trails today.  Will likely extubate today given improving mental status.  Will speak to family, need to determine if trach is an option.  HD per nephrology.  Minimize sedation.  If SBP remains elevated post extubation then will double hydralazine and coreg.  The patient is critically ill with multiple organ systems failure and requires high complexity decision making for assessment and support, frequent evaluation and titration of therapies, application of  advanced monitoring technologies and extensive interpretation of multiple databases.   Critical Care Time devoted to patient care services described in this note is  33  Minutes. This time reflects time of care of this signee Dr Jennet Maduro. This critical care time does not reflect procedure time, or teaching time or supervisory time of PA/NP/Med student/Med Resident etc but could involve care discussion time.  Rush Farmer, M.D. [facility] Pulmonary/Critical Care Medicine. Pager: [phone]. After hours pager: [phone].

## 2019-02-24 NOTE — Progress Notes (Signed)
Spoke w/ pts dtr [name] and provided updates. [name] appreciative.

## 2019-02-24 NOTE — Procedures (Signed)
Extubation Procedure Note  Patient Details:   Name: Colt Zysk DOB: 1955/10/30 MRN: [id number]   Airway Documentation:    Vent end date: 02/24/19 Vent end time: 1015   Evaluation  O2 sats: stable throughout Complications: No apparent complications Patient did tolerate procedure well. Bilateral Breath Sounds: Rhonchi, Diminished   No   Pt was extubated per MD order and placed omn 4 L [place]. Postiive cuff leak was noted prior to extubation. Pt is stable at this time. RT will continue to monitor.   Ronaldo Miyamoto 02/24/2019, 10:21 AM

## 2019-02-24 NOTE — Plan of Care (Signed)
  Problem: Coping: Goal: Level of anxiety will decrease Outcome: Progressing   Problem: Elimination: Goal: Will not experience complications related to bowel motility Outcome: Progressing   Problem: Pain Managment: Goal: General experience of comfort will improve Outcome: Progressing   

## 2019-02-24 NOTE — Progress Notes (Signed)
Assisted tele visit to patient with daughter.  Cliff Damiani [place], RN  

## 2019-02-24 NOTE — Progress Notes (Signed)
Pt doing well w/ ice chips, however has difficulty speaking and moving lips and mouth.  Weak, congested cough, rhonchi lung sounds throughout.  Do not feel pt safe for fluids/PO at this point.  Spoke w/ CCM MD who is in agreement, verbal order to place NG tube.

## 2019-02-24 NOTE — Progress Notes (Signed)
Assisted tele visit to patient with daughter.  Cole Mickley [place], RN   

## 2019-02-25 ENCOUNTER — Inpatient Hospital Stay (HOSPITAL_COMMUNITY): Payer: Medicare PPO

## 2019-02-25 DIAGNOSIS — G244 Idiopathic orofacial dystonia: Secondary | ICD-10-CM

## 2019-02-25 DIAGNOSIS — Z515 Encounter for palliative care: Secondary | ICD-10-CM

## 2019-02-25 DIAGNOSIS — G255 Other chorea: Secondary | ICD-10-CM

## 2019-02-25 DIAGNOSIS — Z7189 Other specified counseling: Secondary | ICD-10-CM

## 2019-02-25 LAB — CBC
HCT: 26 % — ABNORMAL LOW (ref 39.0–52.0)
Hemoglobin: 8.4 g/dL — ABNORMAL LOW (ref 13.0–17.0)
MCH: 25.7 pg — ABNORMAL LOW (ref 26.0–34.0)
MCHC: 32.3 g/dL (ref 30.0–36.0)
MCV: 79.5 fL — ABNORMAL LOW (ref 80.0–100.0)
Platelets: 253 10*3/uL (ref 150–400)
RBC: 3.27 MIL/uL — ABNORMAL LOW (ref 4.22–5.81)
RDW: 15.1 % (ref 11.5–15.5)
WBC: 10.2 10*3/uL (ref 4.0–10.5)
nRBC: 0 % (ref 0.0–0.2)

## 2019-02-25 LAB — RENAL FUNCTION PANEL
Albumin: 2 g/dL — ABNORMAL LOW (ref 3.5–5.0)
Anion gap: 15 (ref 5–15)
BUN: 48 mg/dL — ABNORMAL HIGH (ref 8–23)
CO2: 24 mmol/L (ref 22–32)
Calcium: 9.5 mg/dL (ref 8.9–10.3)
Chloride: 94 mmol/L — ABNORMAL LOW (ref 98–111)
Creatinine, Ser: 5.49 mg/dL — ABNORMAL HIGH (ref 0.61–1.24)
GFR calc Af Amer: 12 mL/min — ABNORMAL LOW (ref >60)
GFR calc non Af Amer: 10 mL/min — ABNORMAL LOW (ref >60)
Glucose, Bld: 111 mg/dL — ABNORMAL HIGH (ref 70–99)
Phosphorus: 3.2 mg/dL (ref 2.5–4.6)
Potassium: 3.6 mmol/L (ref 3.5–5.1)
Sodium: 133 mmol/L — ABNORMAL LOW (ref 135–145)

## 2019-02-25 LAB — BASIC METABOLIC PANEL
Anion gap: 15 (ref 5–15)
BUN: 45 mg/dL — ABNORMAL HIGH (ref 8–23)
CO2: 26 mmol/L (ref 22–32)
Calcium: 9.2 mg/dL (ref 8.9–10.3)
Chloride: 93 mmol/L — ABNORMAL LOW (ref 98–111)
Creatinine, Ser: 5.44 mg/dL — ABNORMAL HIGH (ref 0.61–1.24)
GFR calc Af Amer: 12 mL/min — ABNORMAL LOW (ref >60)
GFR calc non Af Amer: 10 mL/min — ABNORMAL LOW (ref >60)
Glucose, Bld: 115 mg/dL — ABNORMAL HIGH (ref 70–99)
Potassium: 3.6 mmol/L (ref 3.5–5.1)
Sodium: 134 mmol/L — ABNORMAL LOW (ref 135–145)

## 2019-02-25 LAB — MAGNESIUM: Magnesium: 2.3 mg/dL (ref 1.7–2.4)

## 2019-02-25 LAB — CULTURE, BLOOD (ROUTINE X 2)
Culture: NO GROWTH
Culture: NO GROWTH
Special Requests: ADEQUATE

## 2019-02-25 LAB — GLUCOSE, CAPILLARY
Glucose-Capillary: 102 mg/dL — ABNORMAL HIGH (ref 70–99)
Glucose-Capillary: 103 mg/dL — ABNORMAL HIGH (ref 70–99)
Glucose-Capillary: 105 mg/dL — ABNORMAL HIGH (ref 70–99)
Glucose-Capillary: 107 mg/dL — ABNORMAL HIGH (ref 70–99)
Glucose-Capillary: 109 mg/dL — ABNORMAL HIGH (ref 70–99)
Glucose-Capillary: 110 mg/dL — ABNORMAL HIGH (ref 70–99)
Glucose-Capillary: 140 mg/dL — ABNORMAL HIGH (ref 70–99)

## 2019-02-25 LAB — PHOSPHORUS: Phosphorus: 3 mg/dL (ref 2.5–4.6)

## 2019-02-25 MED ORDER — HEPARIN SODIUM (PORCINE) 1000 UNIT/ML DIALYSIS
1000.0000 [IU] | INTRAMUSCULAR | Status: DC | PRN
  Administered 2019-02-25: 09:00:00 1000 [IU] via INTRAVENOUS_CENTRAL
  Filled 2019-02-25: qty 1

## 2019-02-25 MED ORDER — HALOPERIDOL LACTATE 5 MG/ML IJ SOLN
1.0000 mg | Freq: Two times a day (BID) | INTRAMUSCULAR | Status: DC
  Administered 2019-02-25: 1 mg via INTRAVENOUS
  Filled 2019-02-25: qty 1

## 2019-02-25 MED ORDER — LISINOPRIL 10 MG PO TABS
10.0000 mg | ORAL_TABLET | Freq: Every day | ORAL | Status: DC
  Administered 2019-02-25: 12:00:00 10 mg via ORAL
  Filled 2019-02-25: qty 1

## 2019-02-25 MED ORDER — HEPARIN SODIUM (PORCINE) 1000 UNIT/ML DIALYSIS: 20.0000 [IU]/kg | INTRAMUSCULAR | Status: DC | PRN

## 2019-02-25 MED ORDER — HALOPERIDOL LACTATE 5 MG/ML IJ SOLN
2.0000 mg | Freq: Two times a day (BID) | INTRAMUSCULAR | Status: DC
  Administered 2019-02-25 – 2019-02-26 (×2): 2 mg via INTRAVENOUS
  Filled 2019-02-25 (×3): qty 1

## 2019-02-25 MED ORDER — PIPERACILLIN-TAZOBACTAM IN DEX 2-0.25 GM/50ML IV SOLN
2.2500 g | Freq: Three times a day (TID) | INTRAVENOUS | Status: DC
  Administered 2019-02-25 – 2019-02-27 (×5): 2.25 g via INTRAVENOUS
  Filled 2019-02-25 (×6): qty 50

## 2019-02-25 MED ORDER — HALOPERIDOL LACTATE 5 MG/ML IJ SOLN
2.0000 mg | Freq: Once | INTRAMUSCULAR | Status: AC
  Administered 2019-02-25: 2 mg via INTRAVENOUS

## 2019-02-25 MED ORDER — AMLODIPINE BESYLATE 5 MG PO TABS
5.0000 mg | ORAL_TABLET | Freq: Two times a day (BID) | ORAL | Status: DC
  Administered 2019-02-25 – 2019-02-26 (×4): 5 mg via ORAL
  Filled 2019-02-25 (×4): qty 1

## 2019-02-25 NOTE — Progress Notes (Signed)
Patient pulled out NG tube with wife in the room. Pt was also reaching for central line. Placed hand mitts on. Re-inserted NG tube and awaiting x-ray confirmation. Will continue to monitor.  Dewaine Oats, RN

## 2019-02-25 NOTE — Evaluation (Signed)
Occupational Therapy Evaluation Patient Details Name: Cole Nelson MRN: [id number] DOB: September 28, 1955 Today's Date: 02/25/2019    History of Present Illness Pt adm from SNF with AMS and hypoglycemia (blood sugar 31). Pt vomited and aspirated and intubated 8/15-8/19. PMH - ESRD on HD, DM, diastolic HF, HTN, multiple sclerosis, CVA with residual lt sided weakness, chronic lt pleural effusion, cdiff, diabetic neuropathy, depression, lt VATS   Clinical Impression   Pt admitted with above diagnoses, generalized weakness, cardiopulmonary complications, and residual deficits from MS dx limiting ability to engage in BADL at desired level of ind. PTA pt at SNF. He had a recent admission where he was able to tolerate functional mobility to the hallway. Wife present in session, reporting they have all DME needs met in the home. At time of evaluation, he is total for supine <>sit EOB with min-max support to maintain sitting balance. Pt presents with generalized BUE weakness (L>R). At this time recommend return to SNF vs home with hospice per family/pts wants and wishes. Will continue to follow while acute.     Follow Up Recommendations  Other (comment);Supervision/Assistance - 24 hour(return to SNF vs home with hospice)    Equipment Recommendations  None recommended by OT    Recommendations for Other Services       Precautions / Restrictions Precautions Precautions: Fall Restrictions Weight Bearing Restrictions: No      Mobility Bed Mobility Overal bed mobility: Needs Assistance Bed Mobility: Supine to Sit     Supine to sit: Total assist;+2 for physical assistance;+2 for safety/equipment Sit to supine: Total assist;+2 for physical assistance;+2 for safety/equipment   General bed mobility comments: total for BUE/BLE management, bed pad used to scoot as well as total rise at trunk to sit up and steady  Transfers                 General transfer comment: deferred due to fatigue with  EOB t/f    Balance Overall balance assessment: Needs assistance Sitting-balance support: Feet supported;Bilateral upper extremity supported Sitting balance-Leahy Scale: Poor Sitting balance - Comments: able to hold self up for few moments, overall external assist to maintain seated posture                                   ADL either performed or assessed with clinical judgement   ADL Overall ADL's : Needs assistance/impaired Eating/Feeding: NPO   Grooming: Moderate assistance;Bed level   Upper Body Bathing: Moderate assistance;Bed level;Sitting   Lower Body Bathing: Total assistance;Bed level;Sitting/lateral leans;Sit to/from stand   Upper Body Dressing : Moderate assistance;Sitting;Bed level   Lower Body Dressing: Total assistance;Sit to/from stand;Sitting/lateral leans;Bed level   Toilet Transfer: Total assistance;Squat-pivot;BSC;RW   Toileting- Clothing Manipulation and Hygiene: Total assistance Toileting - Clothing Manipulation Details (indicate cue type and reason): flexiseal     Functional mobility during ADLs: Total assistance;+2 for physical assistance;+2 for safety/equipment General ADL Comments: pt with overal generalized weakness and debility for EOB level ADL this date     Vision   Vision Assessment?: No apparent visual deficits     Perception     Praxis      Pertinent Vitals/Pain Pain Assessment: No/denies pain     Hand Dominance     Extremity/Trunk Assessment Upper Extremity Assessment Upper Extremity Assessment: Generalized weakness;LUE deficits/detail LUE Deficits / Details: LUE weaker than RUE from previous CVA LUE Coordination: decreased fine motor;decreased gross motor   Lower  Extremity Assessment Lower Extremity Assessment: Defer to PT evaluation       Communication     Cognition Arousal/Alertness: Awake/alert Behavior During Therapy: [facility] for tasks assessed/performed Overall Cognitive Status: Difficult to assess                                  General Comments: difficulty communicating this date- making difficult to asses, mostly able to answer yes/no to questions which still remained inconsistent throughout eval   General Comments       Exercises     Shoulder Instructions      Home Living Family/patient expects to be discharged to:: Skilled nursing facility Living Arrangements: Spouse/significant other Available Help at Discharge: Family;Available 24 hours/day Type of Home: House Home Access: Stairs to enter [employer] of Steps: 2   Home Layout: One level     Bathroom Shower/Tub: Walk-in shower         Home Equipment: Environmental consultant - 2 wheels;Shower seat;Bedside commode;Hospital bed;Other (comment)(oxygen, suction machine)          Prior Functioning/Environment Level of Independence: Needs assistance  Gait / Transfers Assistance Needed: During admission several weeks ago pt amb 20' with mod assist.  ADL's / Homemaking Assistance Needed: suspect total assist for BADLs at this time            OT Problem List: Decreased strength;Decreased activity tolerance;Impaired balance (sitting and/or standing);Decreased cognition;Decreased safety awareness;Decreased knowledge of use of DME or AE;Impaired UE functional use      OT Treatment/Interventions: Self-care/ADL training;DME and/or AE instruction;Therapeutic activities;Patient/family education;Balance training;Therapeutic exercise;Cognitive remediation/compensation    OT Goals(Current goals can be found in the care plan section) Acute Rehab OT Goals Patient Stated Goal: unable to participate OT Goal Formulation: Patient unable to participate in goal setting Time For Goal Achievement: 03/11/19 Potential to Achieve Goals: Good  OT Frequency: Min 2X/week   Barriers to D/C:            Co-evaluation              AM-PAC OT "6 Clicks" Daily Activity     Outcome Measure Help from another person eating  meals?: Total Help from another person taking care of personal grooming?: A Lot Help from another person toileting, which includes using toliet, bedpan, or urinal?: Total Help from another person bathing (including washing, rinsing, drying)?: Total Help from another person to put on and taking off regular upper body clothing?: A Lot Help from another person to put on and taking off regular lower body clothing?: Total 6 Click Score: 8   End of Session Nurse Communication: Mobility status  Activity Tolerance: Patient limited by fatigue Patient left: in bed;with call bell/phone within reach;with family/visitor present  OT Visit Diagnosis: Unsteadiness on feet (R26.81);Other abnormalities of gait and mobility (R26.89);Muscle weakness (generalized) (M62.81)                Time: 4680-3212 OT Time Calculation (min): 17 min Charges:  OT General Charges $OT Visit: 1 Visit OT Evaluation $OT Eval Moderate Complexity: 1 Mod  Zenovia Jarred, MSOT, OTR/L Behavioral Health OT/ Acute Relief OT [facility] Office: [place] 02/25/2019, 4:19 PM

## 2019-02-25 NOTE — Progress Notes (Addendum)
Reason for consult: Altered mental status   Subjective: Extubated yesterday. Alert this AM. Answers yes/no appropriately to questions, but speech is difficult to comprehend. Getting hemodialysis.   Spoke to wife this morning: She spoke to patient yesterday and noticed improvement. He was able to say her name and his daughters name, but he is not back to baseline. She states the abnormal shoulder movements is new for patient, but the head and lip movements are not. He does this at baseline. He usually does not have any issues with speech and, on good days, is able to have conversations with his family. He is not very active overall, due to his MS, but likes to sit on the porch and spend time with his grandchildren. He does not have any underlying psychiatric illnesses that she knows of. She was told this morning patient may need a trach and feeding tube due to inability to handle secretions. She stated he has this problem intermittently and has a suction machine at home. She talked to his daughter about this and they would not like to pursue trach or feeding tube as the patient's wishes have always been to have a good QOL and stay home. I advised her to speak to his primary team in the ICU to discuss this.    ROS: Unable to obtain due to poor mental status  Examination  Vital signs in last 24 hours: Temp:  [98.5 F (36.9 C)-99.9 F (37.7 C)] 98.9 F (37.2 C) (08/20 0724) Pulse Rate:  [86-103] 103 (08/20 0800) Resp:  [17-35] 28 (08/20 0800) BP: (144-197)/(60-86) 183/77 (08/20 0800) SpO2:  [93 %-100 %] 100 % (08/20 0724) FiO2 (%):  [40 %-97 %] 97 % (08/19 1015) Weight:  [62 kg] 62 kg (08/20 0724)  General: lying in bed, getting HD  CVS: pulse-normal rate and rhythm RS: breathing comfortably on room air  Extremities: normal   Neuro: MS: Alert, speech difficult to comprehend, does follow commands. Writhing movements of head and trunk CN: pupils equal and reactive,  EOMI Motor: 5/5 on RUE  and bilateral LE, and 4/5 LUE Reflexes: patellar and biceps reflexes remain brisk  Coordination: not tested  Gait: not tested  Basic Metabolic Panel: Recent Labs  Lab 02/21/19 0440 02/22/19 0330 02/23/19 0322 02/24/19 0337 02/24/19 0347 02/25/19 0504  NA 130* 132* 130* 132* 133* 134*  K 3.8 4.0 3.8 3.4* 3.5 3.6  CL 87* 93* 90*  --  94* 93*  CO2 26 27 26   --  27 26  GLUCOSE 161* 178* 128*  --  198* 115*  BUN 41* 26* 49*  --  29* 45*  CREATININE 7.31* 5.05* 6.20*  --  3.75* 5.44*  CALCIUM 8.5* 8.7* 8.7*  --  9.1 9.2  MG 2.2 2.0 2.1  --  2.2 2.3  PHOS 7.2* 5.0* 4.1  --  2.3* 3.0    CBC: Recent Labs  Lab 02/20/19 0821  02/20/19 1901  02/21/19 0440 02/21/19 1436 02/22/19 0330 02/23/19 0322 02/24/19 0337 02/24/19 0347  WBC 10.1  --  12.3*  --  17.8*  --  14.1* 12.5*  --  9.9  NEUTROABS 9.2*  --  11.3*  --   --   --   --   --   --   --   HGB 12.5*   < > 12.4*   < > 8.1* 9.0* 8.2* 7.9* 8.5* 8.0*  HCT 42.1   < > 39.3   < > 26.4* 28.8* 27.0* 24.7* 25.0* 25.7*  MCV 83.0  --  82.2  --  82.8  --  83.6 80.2  --  81.6  PLT 335  --  226  --  217  --  194 189  --  204   < > = values in this interval not displayed.     Coagulation Studies: No results for input(s): LABPROT, INR in the last 72 hours.  Imaging No new imaging to review.      ASSESSMENT AND PLAN  1. Acute toxic/metabolic encephalopathy - Mentation continues to improve off sedation. Speech somewhat difficult to comprehend but nods yes/no appropriately to questions.   2. MS- Follows up at [facility]. On glatiramer at home which is being held in the setting of infection.   3. Hypoglycemia - resolved. Having issues tolerating PO intake. Intermittently requires suction at home. Wife and daughter do not want to seem to pursue feeding tube but they would like to speak to primary team about this.   4. 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 Trunk  choreoathetosis is not. Will need to assess if patient had previously been on long term treatment with antipsychotics.   Final recommendations per neurology attending, Dr. Lorraine Lax.    Will start trial of Haloperidol 2mg  BID, if he responds may consider switching to atypical antipsychotic. If this persists, he may benefit from outpatient movement disorders consult and medications such as  Austedo (duetetrabenazine).     Welford Roche, MD  Internal Medicine PGY-3  P [phone]  NEUROHOSPITALIST ADDENDUM Performed a face to face diagnostic evaluation.   I have reviewed the contents of history and physical exam as documented by PA/ARNP/Resident and agree with above documentation.  I have discussed and formulated the above plan as documented. Edits to the note have been made as needed.  Patient now extubated and is alert. Having significant choreoathetosis of face and upper trunk. Will start trial of Haloperidol 2mg  BID, if he responds may consider switching to atypical antipsychotic. We can also consider Ativan and transition to clonazepam.  If this persists, he may benefit from outpatient movement disorders consult and medications such as  Austedo (duetetrabenazine).     Karena Addison Aza Dantes MD [facility] [phone]   If 7pm to 7am, please call on call as listed on AMION.

## 2019-02-25 NOTE — Progress Notes (Signed)
PT Cancellation Note  Patient Details Name: Cole Nelson MRN: [id number] DOB: 1956/03/08   Cancelled Treatment:    Reason Eval/Treat Not Completed: Patient at procedure or test/unavailable. Pt receiving HD. Will reattempt later.    [place] 02/25/2019, 9:10 AM [place] Pager [phone] Office [phone]

## 2019-02-25 NOTE — Evaluation (Signed)
Clinical/Bedside Swallow Evaluation Patient Details  Name: Cole Nelson MRN: [id number] Date of Birth: 01/28/1956  Today's Date: 02/25/2019 Time: SLP Start Time (ACUTE ONLY): 1000 SLP Stop Time (ACUTE ONLY): 1015 SLP Time Calculation (min) (ACUTE ONLY): 15 min  Past Medical History:  Past Medical History:  Diagnosis Date  . Anemia   . CHF (congestive heart failure) ([place])   . CVA (cerebral vascular accident) ([place])   . Diabetes mellitus without complication ([place])   . ESRD (end stage renal disease) on dialysis ([place])   . Gastroparesis due to DM ([place])   . Multiple sclerosis ([place])   . Recurrent left pleural effusion   . Tobacco abuse    Past Surgical History:  Past Surgical History:  Procedure Laterality Date  . BIOPSY  01/31/2019   Procedure: BIOPSY;  Surgeon: Jerene Bears, MD;  Location: [facility] ENDOSCOPY;  Service: Gastroenterology;;  . ENTEROSCOPY N/A 01/31/2019   Procedure: ENTEROSCOPY;  Surgeon: Jerene Bears, MD;  Location: [place];  Service: Gastroenterology;  Laterality: N/A;  . VIDEO ASSISTED THORACOSCOPY (VATS)/DECORTICATION     HPI:  Pt adm from SNF with AMS and hypoglycemia (blood sugar 31). Pt vomited and aspirated and intubated 8/15-8/19. PMH - ESRD on HD, DM, diastolic HF, HTN, multiple sclerosis, CVA with residual lt sided weakness, chronic lt pleural effusion, cdiff, diabetic neuropathy, depression, lt VATS   Assessment / Plan / Recommendation Clinical Impression  Patient presents with a severe oropharyngeal dysphagia with suspected delayed swallow initiation, delays in oral transit of bolsues, delayed cough and throat clear with thin liquids. Plan to complete MBS today to determine swallow ability. SLP Visit Diagnosis: Dysphagia, unspecified (R13.10)    Aspiration Risk  Severe aspiration risk;Risk for inadequate nutrition/hydration    Diet Recommendation Alternative means - temporary;NPO   Medication Administration: Via alternative means    Other   Recommendations Oral Care Recommendations: Oral care QID   Follow up Recommendations Skilled Nursing facility      Frequency and Duration min 2x/week  2 weeks       Prognosis Prognosis for Safe Diet Advancement: Guarded(do not know patient's baseline swallow function at SNF) Barriers to Reach Goals: Cognitive deficits;Time post onset;Severity of deficits      Swallow Study   General Date of Onset: 02/20/19 HPI: Pt adm from SNF with AMS and hypoglycemia (blood sugar 31). Pt vomited and aspirated and intubated 8/15-8/19. PMH - ESRD on HD, DM, diastolic HF, HTN, multiple sclerosis, CVA with residual lt sided weakness, chronic lt pleural effusion, cdiff, diabetic neuropathy, depression, lt VATS Type of Study: Bedside Swallow Evaluation Previous Swallow Assessment: N/A Diet Prior to this Study: NPO Temperature Spikes Noted: No Respiratory Status: Nasal cannula History of Recent Intubation: Yes Length of Intubations (days): 5 days Date extubated: 02/24/19 Behavior/Cognition: Alert;Requires cueing Oral Cavity Assessment: Within Functional Limits Oral Care Completed by SLP: Yes Oral Cavity - Dentition: Edentulous Self-Feeding Abilities: Total assist Patient Positioning: Upright in bed Baseline Vocal Quality: Normal Volitional Cough: Cognitively unable to elicit Volitional Swallow: Able to elicit    Oral/Motor/Sensory Function Overall Oral Motor/Sensory Function: Within functional limits   Ice Chips Ice chips: Impaired Oral Phase Impairments: Reduced lingual movement/coordination Oral Phase Functional Implications: Prolonged oral transit Pharyngeal Phase Impairments: Suspected delayed Swallow   Thin Liquid Thin Liquid: Impaired Presentation: Spoon Pharyngeal  Phase Impairments: Suspected delayed Swallow;Throat Clearing - Delayed;Cough - Delayed    Nectar Thick Nectar Thick Liquid: Not tested   Honey Thick Honey Thick Liquid: Not tested   Puree  Puree: Not tested   Solid      Solid: Not tested      [name] 02/25/2019,4:45 PM   Sonia Baller, MA, CCC-SLP Speech Therapy [facility] [place] Acute Rehab Pager: [phone]

## 2019-02-25 NOTE — Progress Notes (Signed)
Modified Barium Swallow Progress Note  Patient Details  Name: Cole Nelson MRN: [id number] Date of Birth: 02/22/1956  Today's Date: 02/25/2019  Modified Barium Swallow completed.  Full report located under Chart Review in the Imaging Section.  Brief recommendations include the following:  Clinical Impression  Patient presents with a severe oral and a moderate pharyngeal phase dysphagia. He was only able to transit a small amount of one of the puree boluses tested after a considerable amount of time holding orally as well as significantly reduced lingual manipulation and anterior to posterior transit. Majority of boluses were suctioned out of oral cavity. He exhibited oral delays with nectar liquids and thin liquids as well, but with some improvement. Thin liquids resulted in silent aspiration during the swallow and puree solids and nectar thick liquids resulted in vallecular residuals. Patient currently not safe for PO's and will require alternative means of nutrition.   Swallow Evaluation Recommendations       SLP Diet Recommendations: NPO       Medication Administration: Via alternative means               Oral Care Recommendations: Oral care QID        Nadara Mode Tar[place] 02/25/2019,5:01 PM    Sonia Baller, MA, CCC-SLP Speech Therapy [facility] Acute Rehab Pager: [phone]

## 2019-02-25 NOTE — Evaluation (Signed)
Physical Therapy Evaluation Patient Details Name: Cole Nelson MRN: [id number] DOB: 01/22/1956 Today's Date: 02/25/2019   History of Present Illness  Pt adm from SNF with AMS and hypoglycemia (blood sugar 31). Pt vomited and aspirated and intubated 8/15-8/19. PMH - ESRD on HD, DM, diastolic HF, HTN, multiple sclerosis, CVA with residual lt sided weakness, chronic lt pleural effusion, cdiff, diabetic neuropathy, depression, lt VATS  Clinical Impression  Pt presents to PT with very poor mobility. Wife considering home with hospice. Expect any progress with functional mobility will be slow and dependent on medical status.     Follow Up Recommendations Supervision/Assistance - 24 hour(home with hospice vs return to SNF)    Equipment Recommendations  None recommended by PT    Recommendations for Other Services       Precautions / Restrictions Precautions Precautions: Fall Restrictions Weight Bearing Restrictions: No      Mobility  Bed Mobility Overal bed mobility: Needs Assistance Bed Mobility: Supine to Sit;Sit to Supine     Supine to sit: Total assist;+2 for physical assistance;+2 for safety/equipment Sit to supine: Total assist;+2 for physical assistance;+2 for safety/equipment   General bed mobility comments: Assist for all aspects  Transfers                 General transfer comment: Unable to safely assess  Ambulation/Gait                Stairs            Wheelchair Mobility    Modified Rankin (Stroke Patients Only)       Balance Overall balance assessment: Needs assistance Sitting-balance support: Feet supported;Bilateral upper extremity supported Sitting balance-Leahy Scale: Poor Sitting balance - Comments: Sat EOB x 10 minutes with min to mod assist with brief moments of min guard  Postural control: Right lateral lean                                   Pertinent Vitals/Pain Pain Assessment: Faces Faces Pain Scale: No  hurt    Home Living Family/patient expects to be discharged to:: Skilled nursing facility Living Arrangements: Spouse/significant other Available Help at Discharge: Family;Available 24 hours/day Type of Home: House Home Access: Stairs to enter Entrance Stairs-Rails: (Don't know) Entrance Stairs-Number of Steps: 2 Home Layout: One level Home Equipment: Walker - 2 wheels;Shower seat;Bedside commode;Hospital bed;Other (comment)(oxygen, suction machine)      Prior Function Level of Independence: Needs assistance   Gait / Transfers Assistance Needed: During admission several weeks ago pt amb 20' with mod assist.   ADL's / Homemaking Assistance Needed: suspect total assist for BADLs at this time        Hand Dominance   Dominant Hand: Right    Extremity/Trunk Assessment   Upper Extremity Assessment Upper Extremity Assessment: Generalized weakness LUE Deficits / Details: LUE weaker than RUE from previous CVA LUE Coordination: decreased fine motor;decreased gross motor    Lower Extremity Assessment Lower Extremity Assessment: RLE deficits/detail;LLE deficits/detail RLE Deficits / Details: Less than 3/5 to observation. Didn't follow commands for formal testing.  LLE Deficits / Details: To observation weaker than rt       Communication   Communication: Expressive difficulties  Cognition Arousal/Alertness: Awake/alert Behavior During Therapy: WFL for tasks assessed/performed Overall Cognitive Status: Difficult to assess  General Comments: Pt followed 1 step commands inconsistently.       General Comments General comments (skin integrity, edema, etc.): RR to 31 at times sitting    Exercises     Assessment/Plan    PT Assessment Patient needs continued PT services  PT Problem List Decreased strength;Decreased activity tolerance;Decreased balance;Decreased mobility;Decreased cognition;Decreased knowledge of  precautions;Decreased coordination;Decreased safety awareness;Decreased knowledge of use of DME       PT Treatment Interventions DME instruction;Functional mobility training;Therapeutic activities;Therapeutic exercise;Balance training;Cognitive remediation;Patient/family education    PT Goals (Current goals can be found in the Care Plan section)  Acute Rehab PT Goals Patient Stated Goal: unable to participate PT Goal Formulation: With patient/family Time For Goal Achievement: 03/11/19 Potential to Achieve Goals: Fair    Frequency Min 2X/week   Barriers to discharge        Co-evaluation               AM-PAC PT "6 Clicks" Mobility  Outcome Measure Help needed turning from your back to your side while in a flat bed without using bedrails?: Total Help needed moving from lying on your back to sitting on the side of a flat bed without using bedrails?: Total Help needed moving to and from a bed to a chair (including a wheelchair)?: Total Help needed standing up from a chair using your arms (e.g., wheelchair or bedside chair)?: Total Help needed to walk in hospital room?: Total Help needed climbing 3-5 steps with a railing? : Total 6 Click Score: 6    End of Session   Activity Tolerance: Patient limited by fatigue Patient left: with call bell/phone within reach;in bed;with nursing/sitter in room;with bed alarm set Nurse Communication: Mobility status PT Visit Diagnosis: Other abnormalities of gait and mobility (R26.89);Muscle weakness (generalized) (M62.81)    Time: [id number] PT Time Calculation (min) (ACUTE ONLY): 19 min   Charges:   PT Evaluation $PT Eval Moderate Complexity: [place] Pager [phone] Office [place] 02/25/2019, 5:20 PM

## 2019-02-25 NOTE — Progress Notes (Signed)
[facility]    NEPHROLOGY PROGRESS NOTE  SUBJECTIVE:  Extubated yesterday.  BP's 140's to 180's.  Didn't tolerate ice chips well yesterday per nursing but has been able to get his meds via NGT  Review of systems:  Not able to cough  Denies overt shortness of breath  Not taking po - has NGT  OBJECTIVE:  Vitals:   02/25/19 0000 02/25/19 0023  BP: (!) 159/70   Pulse: 99   Resp: (!) 32   Temp:  99.9 F (37.7 C)  SpO2: 99%     Intake/Output Summary (Last 24 hours) at 02/25/2019 0519 Last data filed at 02/24/2019 2000 Gross per 24 hour  Intake 461.62 ml  Output 75 ml  Net 386.62 ml      General: adult male in bed HEENT: MMM [place] AT anicteric sclera CV:  RRR no rub  Lungs:  coarse breath sounds bilaterally; occ rhonchi  Abd:  abd SNT/ND  Extremities: no lower extremity edema  Neuro: awake and interactive Access: left upper extremity AV graft with a good thrill and bruit.    MEDICATIONS:  . amLODipine  5 mg Oral Daily  . aspirin  81 mg Oral Daily  . carvedilol  25 mg Oral BID WC  . chlorhexidine  15 mL Mouth Rinse BID  . Chlorhexidine Gluconate Cloth  6 each Topical Daily  . Chlorhexidine Gluconate Cloth  6 each Topical Q0600  . cloNIDine  0.2 mg Oral TID  . darbepoetin (ARANESP) injection - DIALYSIS  60 mcg Intravenous Q Tue-HD  . docusate  100 mg Oral Daily  . enoxaparin (LOVENOX) injection  30 mg Subcutaneous Q24H  . feeding supplement (PRO-STAT SUGAR FREE 64)  30 mL Per Tube BID  . hydrALAZINE  100 mg Oral Q8H  . insulin aspart  1-3 Units Subcutaneous Q4H  . mouth rinse  15 mL Mouth Rinse q12n4p  . midazolam  2 mg Intravenous Once  . multivitamin  1 tablet Oral QHS  . pantoprazole sodium  40 mg Per Tube Daily       LABS:   CBC Latest Ref Rng & Units 02/24/2019 02/24/2019 02/23/2019  WBC 4.0 - 10.5 K/uL 9.9 - 12.5(H)  Hemoglobin 13.0 - 17.0 g/dL 8.0(L) 8.5(L) 7.9(L)  Hematocrit 39.0 - 52.0 % 25.7(L) 25.0(L) 24.7(L)  Platelets 150 - 400 K/uL  204 - 189    CMP Latest Ref Rng & Units 02/24/2019 02/24/2019 02/23/2019  Glucose 70 - 99 mg/dL 198(H) - 128(H)  BUN 8 - 23 mg/dL 29(H) - 49(H)  Creatinine 0.61 - 1.24 mg/dL 3.75(H) - 6.20(H)  Sodium 135 - 145 mmol/L 133(L) 132(L) 130(L)  Potassium 3.5 - 5.1 mmol/L 3.5 3.4(L) 3.8  Chloride 98 - 111 mmol/L 94(L) - 90(L)  CO2 22 - 32 mmol/L 27 - 26  Calcium 8.9 - 10.3 mg/dL 9.1 - 8.7(L)  Total Protein 6.5 - 8.1 g/dL - - -  Total Bilirubin 0.3 - 1.2 mg/dL - - -  Alkaline Phos 38 - 126 U/L - - -  AST 15 - 41 U/L - - -  ALT 0 - 44 U/L - - -    Lab Results  Component Value Date   CALCIUM 9.1 02/24/2019   CAION 1.23 02/24/2019   PHOS 2.3 (L) 02/24/2019    No results found for: COLORURINE, APPEARANCEUR, LABSPEC, PHURINE, GLUCOSEU, HGBUR, BILIRUBINUR, KETONESUR, PROTEINUR, UROBILINOGEN, NITRITE, LEUKOCYTESUR    Component Value Date/Time   PHART 7.513 (H) 02/24/2019 0337   PCO2ART 35.9 02/24/2019 [id number]  PO2ART 79.0 (L) 02/24/2019 0337   HCO3 28.8 (H) 02/24/2019 0337   TCO2 30 02/24/2019 0337   O2SAT 97.0 02/24/2019 0337       Component Value Date/Time   IRON 75 02/03/2019 1117   TIBC 169 (L) 02/03/2019 1117   FERRITIN 1,429 (H) 02/03/2019 1117   IRONPCTSAT 44 (H) 02/03/2019 1117       ASSESSMENT/PLAN:     1.  End-stage renal disease on hemodialysis.  - Continue HD per TTS schedule  - AM labs are pending   2.  Acute hypoxemic respiratory failure.  Likely secondary to pneumonitis.  History of left pleural effusion status post left VATS in July 2020 with loculated basilar left hydro-with pneumothorax.  S/p Thoracentesis. abx per primary team.  Extubated    3.  HTN. Continue carvedilol. Continue clonidine 0.2 mg TID (compared with home 0.3 mg BID).  Continue hydralazine 100 mg TID.  Increase amlodipine back to 5 mg BID per reported home dosing and add back lisinopril 10 mg daily   4.  Acute metabolic encephalopathy secondary to hypoglycemia.  Team is monitoring blood sugars  closely.  mental status improving.   5.  Anemia of chronic kidney disease.   Monitor serial hemoglobins.  Restarted ESA.  Trend.  Is iron replete.   6. hx Hyperphosphatemia.  On phoslo via tube - discontinue for now with mild hypophos and follow.     Claudia Desanctis 02/25/2019 5:19 AM

## 2019-02-25 NOTE — Progress Notes (Addendum)
NAME:  Cole Nelson, MRN:  [id number], DOB:  [birth year], LOS: 5 ADMISSION DATE:  02/20/2019, CONSULTATION DATE:  02/20/2019 REFERRING MD:  Dr. Rogene Houston, [facility], CHIEF COMPLAINT: Altered mental status  Brief History   63 yo male from [place] SN[facility] with altered mental status and found to have blood sugar of 31.  Had vomiting and aspiration event, and required intubation.  Was in hospital 7/23 to 8/12 with vomiting from gastroparesis.  Past Medical History  ESRD, MS, DM, CVA, Gastroparesis, HTN, Lt pleural effusion s/p VATS January 18, 2019  Significant Hospital Events   8/15 Admit  Consults:  Renal 8/15 ESRD  Procedures:  ETT 8/15 >> 02/24/2019 RIJ 8/16 >>  Significant Diagnostic Tests:  CT head 8/15 >> atrophy and chronic ischemic changes CT chest 8/15 >> Persistent large left-sided hydropneumothorax with improved aeration within the left upper and left lower lobes.  New extensive pulmonary opacities throughout both lungs, with large areas of consolidation involving the right lower lobe, right middle lobe, and right upper lobe. These findings are concerning for multifocal pneumonia versus aspiration. Moderate size right-sided pleural effusion which may be partially Loculated. 02/23/2019 MRI brain extremely poor study.  Findings consistent with MS  Micro Data:  COVID 8/15 >> negative Blood 8/15 >> 1 of 2 blood cultures with coag negative staph>> Sputum 8/15 >> prove candidemia glabrata Urine 8/15 >> does not make urine Pleural fluid 02/21/2019>>  Antimicrobials:  Zosyn 8/15 >>  Vancomycin 8/15 >> 8/16  Interim history/subjective:  Remains extubated no acute distress.  Objective   Blood pressure (!) 194/85, pulse (!) 101, temperature 98.8 F (37.1 C), temperature source Oral, resp. rate 20, height 5\' 11"  (1.803 m), weight 62 kg, SpO2 100 %.    Vent Mode: PSV;CPAP FiO2 (%):  [40 %-97 %] 97 % Set Rate:  [18 bmp] 18 bmp Vt Set:  [600 mL] 600 mL PEEP:  [5 cmH20] 5 cmH20  Pressure Support:  [5 cmH20] 5 cmH20   Intake/Output Summary (Last 24 hours) at 02/25/2019 0842 Last data filed at 02/25/2019 0600 Gross per 24 hour  Intake 206.62 ml  Output 75 ml  Net 131.62 ml   Filed Weights   02/23/19 1147 02/24/19 0346 02/25/19 0724  Weight: 57.1 kg 60.3 kg 62 kg   Examination: General: Frail thin male follows commands HEENT: No JVD lymphadenopathy is appreciated.  NG tube is in place.tube feeding currently Neuro: Rhythmic movements of upper head and trunk, follows commands moves extremities CV: Heart sounds are regular regular rate and rhythm PULM: Diminished in the bases bilaterally GI: soft, bsx4 active  Extremities: warm/dry, 1+ edema  Skin: no rashes or lesions  02/25/2019 chest x-ray since removal of endotracheal tube.  Stable left hydropneumothorax no significant change in right lung.  Resolved Hospital Problem list     Assessment & Plan:   Acute hypoxic respiratory failure with altered mental status and compromised airway from hypoglycemia complicated by aspiration pneumonitis. Hx of Lt pleural effusion s/p Lt VATS July 2020 now with loculated basilar Lt hydropneumothorax. Hx of asthma. RT parapneumonic effusion? loculated Extubated 02/24/2019. Continue Zosyn day 5 of 8 for coag negative staph Bronchodilators Mobilize His poor mucociliary clearance may lead to respiratory distress in the future.  Acute metabolic encephalopathy with hypoglycemia. Hx of MS, CVA.  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 He will need the need to start tube feedings back until swallow evaluation can be completed.  Rt buttock pressure ulcer. Continue wound care  Hx of HTN, HLD. Resuming home medications via NG tube  02/23/2019 much more awake.  Poor quality MRI due to artifact of motion.  Findings consistent with MS without acute abnormality  Best practice:  Diet: tube feeds DVT prophylaxis: lovenox GI prophylaxis: Pepcid Mobility: bed rest Code Status: full code Family communication: Spoke to wife at length on 02/24/2019 concerning possible tracheostomy post extubation.  He is successfully liberated from mechanical ventilatory support. Disposition: ICU he can be transferred to progressive care to [facility]  App cct 30 min  Richardson Landry Minor ACNP Maryanna Shape PCCM Pager [phone] till 1 pm If no answer page [phone] 02/25/2019, 8:42 AM  Attending Note:  63 year old male with MS who presents to PCCM with aspiration pneumonia and respiratory failure.  Patient was extubated on 8/19 and done well overnight but this AM he is clearly aspirating and not protecting his airway with coarse BS on exam.  I am concerned that he will develop respiratory failure.  I reviewed CXR myself, infiltrate noted.  Discussed with PCCM-NP.  I called the patient's wife.  After a long discussion decision was made to make patient a full DNR with no further escalation of care.  If patient develops florid respiratory failure then transition to comfort care.  In the meantime, continue care as is.  The patient is critically ill with multiple organ systems failure and requires high complexity decision making for assessment and support, frequent evaluation and titration of therapies, application of advanced monitoring technologies and extensive interpretation of multiple databases.   Critical Care Time devoted to patient care services described in this note is  33  Minutes. This time reflects time of care of this signee Dr  Jennet Maduro. This critical care time does not reflect procedure time, or teaching time or supervisory time of PA/NP/Med student/Med Resident etc but could involve care discussion time.  Rush Farmer, M.D. [facility]. Pager: [phone]. After hours pager: [phone].

## 2019-02-25 NOTE — Progress Notes (Signed)
Assisted tele visit to patient with family member.  Vinie Charity Anderson, RN   

## 2019-02-25 NOTE — Progress Notes (Signed)
The wife, Cole Nelson, talked to me about wanting to take him home on hospice. Paged MD, Dr. Nelda Marseille who told me to reach out to palliative. Palliative reached and they will look into his chart today. Social work is also consulted to help coordinate residential hospice. Will continue to follow up & monitor.  Dewaine Oats, RN

## 2019-02-26 ENCOUNTER — Inpatient Hospital Stay (HOSPITAL_COMMUNITY): Payer: Medicare PPO

## 2019-02-26 DIAGNOSIS — G249 Dystonia, unspecified: Secondary | ICD-10-CM

## 2019-02-26 LAB — BASIC METABOLIC PANEL
Anion gap: 13 (ref 5–15)
BUN: 29 mg/dL — ABNORMAL HIGH (ref 8–23)
CO2: 26 mmol/L (ref 22–32)
Calcium: 9.1 mg/dL (ref 8.9–10.3)
Chloride: 96 mmol/L — ABNORMAL LOW (ref 98–111)
Creatinine, Ser: 3.86 mg/dL — ABNORMAL HIGH (ref 0.61–1.24)
GFR calc Af Amer: 18 mL/min — ABNORMAL LOW (ref >60)
GFR calc non Af Amer: 16 mL/min — ABNORMAL LOW (ref >60)
Glucose, Bld: 170 mg/dL — ABNORMAL HIGH (ref 70–99)
Potassium: 2.9 mmol/L — ABNORMAL LOW (ref 3.5–5.1)
Sodium: 135 mmol/L (ref 135–145)

## 2019-02-26 LAB — CULTURE, BODY FLUID W GRAM STAIN -BOTTLE: Culture: NO GROWTH

## 2019-02-26 LAB — GLUCOSE, CAPILLARY
Glucose-Capillary: 164 mg/dL — ABNORMAL HIGH (ref 70–99)
Glucose-Capillary: 159 mg/dL — ABNORMAL HIGH (ref 70–99)
Glucose-Capillary: 163 mg/dL — ABNORMAL HIGH (ref 70–99)
Glucose-Capillary: 172 mg/dL — ABNORMAL HIGH (ref 70–99)
Glucose-Capillary: 155 mg/dL — ABNORMAL HIGH (ref 70–99)
Glucose-Capillary: 164 mg/dL — ABNORMAL HIGH (ref 70–99)
Glucose-Capillary: 153 mg/dL — ABNORMAL HIGH (ref 70–99)

## 2019-02-26 LAB — PHOSPHORUS: Phosphorus: 2.9 mg/dL (ref 2.5–4.6)

## 2019-02-26 LAB — MAGNESIUM: Magnesium: 2.3 mg/dL (ref 1.7–2.4)

## 2019-02-26 MED ORDER — CARVEDILOL 25 MG PO TABS
25.0000 mg | ORAL_TABLET | Freq: Two times a day (BID) | ORAL | Status: DC
  Administered 2019-02-27 – 2019-03-01 (×5): 25 mg
  Filled 2019-02-26 (×5): qty 1

## 2019-02-26 MED ORDER — LISINOPRIL 20 MG PO TABS
20.0000 mg | ORAL_TABLET | Freq: Every day | ORAL | Status: DC
  Administered 2019-02-27 – 2019-03-01 (×3): 20 mg
  Filled 2019-02-26 (×3): qty 1

## 2019-02-26 MED ORDER — CHLORHEXIDINE GLUCONATE CLOTH 2 % EX PADS: 6.0000 | MEDICATED_PAD | Freq: Every day | CUTANEOUS | Status: DC

## 2019-02-26 MED ORDER — HYDRALAZINE HCL 50 MG PO TABS
100.0000 mg | ORAL_TABLET | Freq: Three times a day (TID) | ORAL | Status: DC
  Administered 2019-02-27 – 2019-03-01 (×7): 100 mg
  Filled 2019-02-26 (×7): qty 2

## 2019-02-26 MED ORDER — OLANZAPINE 5 MG PO TABS
5.0000 mg | ORAL_TABLET | Freq: Every day | ORAL | Status: DC
  Administered 2019-02-26 – 2019-02-27 (×2): 5 mg
  Filled 2019-02-26 (×2): qty 1

## 2019-02-26 MED ORDER — GABAPENTIN 250 MG/5ML PO SOLN
100.0000 mg | Freq: Every day | ORAL | Status: DC
  Administered 2019-02-26 – 2019-02-28 (×3): 100 mg
  Filled 2019-02-26 (×4): qty 2

## 2019-02-26 MED ORDER — OLANZAPINE 5 MG PO TABS
5.0000 mg | ORAL_TABLET | Freq: Every day | ORAL | Status: DC
  Filled 2019-02-26 (×2): qty 1

## 2019-02-26 MED ORDER — LISINOPRIL 20 MG PO TABS
20.0000 mg | ORAL_TABLET | Freq: Every day | ORAL | Status: DC
  Administered 2019-02-26: 20 mg via ORAL
  Filled 2019-02-26: qty 1

## 2019-02-26 MED ORDER — DARBEPOETIN ALFA 100 MCG/0.5ML IJ SOSY: 100.0000 ug | PREFILLED_SYRINGE | INTRAMUSCULAR | Status: DC

## 2019-02-26 MED ORDER — POTASSIUM CHLORIDE CRYS ER 20 MEQ PO TBCR: 30.0000 meq | EXTENDED_RELEASE_TABLET | Freq: Once | ORAL | Status: DC

## 2019-02-26 MED ORDER — AMLODIPINE BESYLATE 5 MG PO TABS
5.0000 mg | ORAL_TABLET | Freq: Two times a day (BID) | ORAL | Status: DC
  Administered 2019-02-27 – 2019-03-01 (×5): 5 mg
  Filled 2019-02-26 (×5): qty 1

## 2019-02-26 MED ORDER — DOCUSATE SODIUM 50 MG/5ML PO LIQD
100.0000 mg | Freq: Every day | ORAL | Status: DC
  Administered 2019-03-01: 100 mg
  Filled 2019-02-26: qty 10

## 2019-02-26 MED ORDER — DICYCLOMINE HCL 10 MG/5ML PO SOLN
10.0000 mg | Freq: Two times a day (BID) | ORAL | Status: DC
  Administered 2019-02-26 – 2019-03-01 (×7): 10 mg
  Filled 2019-02-26 (×8): qty 5

## 2019-02-26 MED ORDER — POTASSIUM CHLORIDE 20 MEQ/15ML (10%) PO SOLN
30.0000 meq | Freq: Once | ORAL | Status: AC
  Administered 2019-02-26: 09:00:00 30 meq via ORAL
  Filled 2019-02-26: qty 30

## 2019-02-26 MED ORDER — ASPIRIN 81 MG PO CHEW
81.0000 mg | CHEWABLE_TABLET | Freq: Every day | ORAL | Status: DC
  Administered 2019-02-27 – 2019-02-28 (×2): 81 mg
  Filled 2019-02-26 (×2): qty 1

## 2019-02-26 MED ORDER — POTASSIUM CHLORIDE 10 MEQ/50ML IV SOLN: 10.0000 meq | INTRAVENOUS | Status: DC

## 2019-02-26 MED ORDER — CLONIDINE HCL 0.2 MG PO TABS
0.2000 mg | ORAL_TABLET | Freq: Three times a day (TID) | ORAL | Status: DC
  Administered 2019-02-27 – 2019-02-28 (×6): 0.2 mg
  Filled 2019-02-26 (×6): qty 1

## 2019-02-26 MED ORDER — POTASSIUM CHLORIDE 20 MEQ/15ML (10%) PO SOLN
30.0000 meq | Freq: Once | ORAL | Status: AC
  Administered 2019-02-26: 30 meq via ORAL
  Filled 2019-02-26: qty 30

## 2019-02-26 NOTE — Progress Notes (Signed)
  Speech Language Pathology Treatment: Dysphagia  Patient Details Name: Cole Nelson MRN: [id number] DOB: 1955-08-19 Today's Date: 02/26/2019 Time: [id number] SLP Time Calculation (min) (ACUTE ONLY): 14 min  Assessment / Plan / Recommendation Clinical Impression  Pt stable per RN. Pt alert and responsive to simple verbal commands, unsure if mentation is any better than during my colleague's assessment yesterday. Goal of session to decrease length of oral holding with verbal cues and increasing awareness. Pt consistently able to initiate swallow after each trial. Holding time varied from 4-20 seconds. Verbal cues beneficial. Counting beneficial. Pts vocal quality stayed dry during 4 oz intake of puree, but trials of ice were followed by coughing. Pt showing progress. Anticipate improvement if mentation continues to improve. Will follow for trials, RN may given bites of puree intermittently with close supervision.   HPI HPI: Pt adm from SNF with AMS and hypoglycemia (blood sugar 31). Pt vomited and aspirated and intubated 8/15-8/19. PMH - ESRD on HD, DM, diastolic HF, HTN, multiple sclerosis, CVA with residual lt sided weakness, chronic lt pleural effusion, cdiff, diabetic neuropathy, depression, lt VATS      SLP Plan  Continue with current plan of care       Recommendations  Diet recommendations: NPO                Plan: Continue with current plan of care       GO               Herbie Baltimore, MA [place] Pager [phone] Office [phone]  Lynann Beaver 02/26/2019, 9:39 AM

## 2019-02-26 NOTE — Progress Notes (Signed)
PROGRESS NOTE    Cole Nelson  [id number] DOB: 04-24-1956 DOA: 02/20/2019 PCP: Caprice Renshaw, MD  Brief Narrative: This is a chronically ill 64 year old African-American male from SNF with ESRD on hemodialysis, advanced multiple sclerosis, type 2 diabetes mellitus, history of CVA/left hemiplegia, gastroparesis, history of left pleural effusion status post VATS in 01/18/19, chronic left pleural effusion. -He underwent VATS, chest tube placement on 7/13, reportedly found to have a pleural pulsatile mass. CT scan showed consolidation or mass along the pleural surface of the anterior left upper lobe, outpatient follow-up at [facility] was recommended -Admitted to [facility], ICU with respiratory failure, aspiration pneumonia required mechanical ventilation. -CT on admission showed persistent large left hydropneumothorax -Treated with mechanical ventilation, broad-spectrum antibiotics, also underwent thoracentesis -Extubated 8/19 transferred to [facility] service today  Assessment & Plan:    Acute hypoxic respiratory failure -Status post mechanical ventilation, extubated on 8/19 -Felt to be secondary to aspiration pneumonia and hypoglycemia -Also has history of chronic left pleural effusion with loculation, status post VATS in July, concern for pulsatile pleural-based mass versus consolidation, outpatient tertiary center follow-up recommended then -Day 7 of IV Zosyn now, will continue this to complete 10-day course, given aspiration pneumonia with parapneumonic effusion -Blood cultures negative, respiratory culture with some yeast, he grew coagulase-negative staph in 1 out of 4 blood cultures with which is consistent with contaminant -Remains at high risk for aspiration  Recurrent left pleural effusion -history of chronic left pleural effusion with loculation, status post VATS in July, concern for pulsatile pleural-based mass versus consolidation, outpatient tertiary center follow-up recommended  then -Had thoracentesis by IR done on 8/16, 400 cc of yellow fluid was drained, this was exudative by LDH, suspected to be a parapneumonic effusion, per Dr.Yacoub, they were not concerned about empyema, no further interventions felt to be indicated, left lower lung was felt to be likely trapped, cultures from pleural fluid are negative  Severe dysphagia -Felt to have severe oropharyngeal dysphagia per SLP evaluation yesterday -Now n.p.o., continue tube feeds via NG tube -SLP continues to follow -Continues to be high risk of aspiration, difficulty managing secretions -Palliative consult for goals of care -Now DNR per PCCM  ESRD on hemodialysis -Per renal -Graft issues  Choreoathetosis of trunk, head and orofacial dyskinesia -Seen by neurology, started on Haldol yesterday, transitioned to Zyprexa today -Patient was taking bentyl 2-3times and gabapentin times prior to admission, I wonder if this withdrawal was contributing -Will restart bentyl and low-dose gabapentin  Advanced multiple sclerosis -SNF resident, almost total care -Resume gabapentin and Bentyl  Anemia of chronic disease -Stable  Hypertension -Continue carvedilol, clonidine, hydralazine, lisinopril  History of CVA, left hemiplegia -Continue aspirin  Severe protein calorie malnutrition -Continue tube feeds via NG tube  History of gastroparesis  Right sacral pressure ulcer -Present on admission, continue wound care  Type 2 diabetes mellitus -CBG stable, continue sliding scale insulin  DVT prophylaxis: Lovenox Code Status: DNR Family Communication: No family at bedside will update spouse Disposition Plan: Transfer out of ICU to stepdown  Consultants:   Neurology   Procedures: Thoracentesis 8/16 400 cc of yellow fluid drained  Antimicrobials:    Subjective: -Shakes have improved per staff, no events overnight, remains unable to manage secretions -Answer some questions appropriately  Objective:  Vitals:   02/26/19 0900 02/26/19 1000 02/26/19 1100 02/26/19 1200  BP: (!) 183/74 135/64 139/66 (!) 157/70  Pulse: 75 73 73 81  Resp: 18 (!) 22 (!) 21 18  Temp:  97.8 F (36.6 C)  TempSrc:    Oral  SpO2: 100% 100% 100% 100%  Weight:      Height:        Intake/Output Summary (Last 24 hours) at 02/26/2019 1248 Last data filed at 02/26/2019 1200 Gross per 24 hour  Intake 802.91 ml  Output -  Net 802.91 ml   Filed Weights   02/24/19 0346 02/25/19 0724 02/26/19 0428  Weight: 60.3 kg 62 kg 58 kg    Examination:  General exam: Chronically ill African-American male, cachectic, oriented to self only , sitting up in the ICU bed, no distress   Respiratory system: Decreased breath sounds at both bases Cardiovascular system: S1 & S2 heard, RRR.  Gastrointestinal system: Abdomen is nondistended, soft and nontender.Normal bowel sounds heard. Central nervous system: Awake alert, left hemiplegia, poor effort Extremities: No edema Skin: No rashes, lesions or ulcers Psychiatry: Pleasant, unable to assess, poor insight and judgment    Data Reviewed:   CBC: Recent Labs  Lab 02/20/19 0821  02/20/19 1901  02/21/19 0440  02/22/19 0330 02/23/19 0322 02/24/19 0337 02/24/19 0347 02/25/19 0722  WBC 10.1  --  12.3*  --  17.8*  --  14.1* 12.5*  --  9.9 10.2  NEUTROABS 9.2*  --  11.3*  --   --   --   --   --   --   --   --   HGB 12.5*   < > 12.4*   < > 8.1*   < > 8.2* 7.9* 8.5* 8.0* 8.4*  HCT 42.1   < > 39.3   < > 26.4*   < > 27.0* 24.7* 25.0* 25.7* 26.0*  MCV 83.0  --  82.2  --  82.8  --  83.6 80.2  --  81.6 79.5*  PLT 335  --  226  --  217  --  194 189  --  204 253   < > = values in this interval not displayed.   Basic Metabolic Panel: Recent Labs  Lab 02/22/19 0330 02/23/19 0322 02/24/19 0337 02/24/19 0347 02/25/19 0504 02/25/19 0722 02/26/19 0413  NA 132* 130* 132* 133* 134* 133* 135  K 4.0 3.8 3.4* 3.5 3.6 3.6 2.9*  CL 93* 90*  --  94* 93* 94* 96*  CO2 27 26  --  27 26  24 26   GLUCOSE 178* 128*  --  198* 115* 111* 170*  BUN 26* 49*  --  29* 45* 48* 29*  CREATININE 5.05* 6.20*  --  3.75* 5.44* 5.49* 3.86*  CALCIUM 8.7* 8.7*  --  9.1 9.2 9.5 9.1  MG 2.0 2.1  --  2.2 2.3  --  2.3  PHOS 5.0* 4.1  --  2.3* 3.0 3.2 2.9   GFR: Estimated Creatinine Clearance: 16.3 mL/min (A) (by C-G formula based on SCr of 3.86 mg/dL (H)). Liver Function Tests: Recent Labs  Lab 02/20/19 0821 02/20/19 1901 02/25/19 0722  AST 24 24  --   ALT 12 11  --   ALKPHOS 121 98  --   BILITOT 0.4 0.6  --   PROT 9.2* 8.0  --   ALBUMIN 3.7 3.1* 2.0*   Recent Labs  Lab 02/20/19 0821  LIPASE 33   No results for input(s): AMMONIA in the last 168 hours. Coagulation Profile: Recent Labs  Lab 02/20/19 1901  INR 1.0   Cardiac Enzymes: No results for input(s): CKTOTAL, CKMB, CKMBINDEX, TROPONINI in the last 168 hours. BNP (last 3 results)  No results for input(s): PROBNP in the last 8760 hours. HbA1C: No results for input(s): HGBA1C in the last 72 hours. CBG: Recent Labs  Lab 02/25/19 1946 02/25/19 2345 02/26/19 0344 02/26/19 0746 02/26/19 1132  GLUCAP 110* 140* 164* 159* 163*   Lipid Profile: No results for input(s): CHOL, HDL, LDLCALC, TRIG, CHOLHDL, LDLDIRECT in the last 72 hours. Thyroid Function Tests: No results for input(s): TSH, T4TOTAL, FREET4, T3FREE, THYROIDAB in the last 72 hours. Anemia Panel: No results for input(s): VITAMINB12, FOLATE, FERRITIN, TIBC, IRON, RETICCTPCT in the last 72 hours. Urine analysis: No results found for: COLORURINE, APPEARANCEUR, LABSPEC, PHURINE, GLUCOSEU, HGBUR, BILIRUBINUR, KETONESUR, PROTEINUR, UROBILINOGEN, NITRITE, LEUKOCYTESUR Sepsis Labs: @LABRCNTIP (procalcitonin:4,lacticidven:4)  ) Recent Results (from the past 240 hour(s))  SARS Coronavirus 2 [facility] order, Performed in [facility] hospital lab) Nasopharyngeal Nasopharyngeal Swab     Status: None   Collection Time: 02/20/19 11:42 AM   Specimen: Nasopharyngeal Swab   Result Value Ref Range Status   SARS Coronavirus 2 NEGATIVE NEGATIVE Final    Comment: (NOTE) If result is NEGATIVE SARS-CoV-2 target nucleic acids are NOT DETECTED. The SARS-CoV-2 RNA is generally detectable in upper and lower  respiratory specimens during the acute phase of infection. The lowest  concentration of SARS-CoV-2 viral copies this assay can detect is 250  copies / mL. A negative result does not preclude SARS-CoV-2 infection  and should not be used as the sole basis for treatment or other  patient management decisions.  A negative result may occur with  improper specimen collection / handling, submission of specimen other  than nasopharyngeal swab, presence of viral mutation(s) within the  areas targeted by this assay, and inadequate number of viral copies  (<250 copies / mL). A negative result must be combined with clinical  observations, patient history, and epidemiological information. If result is POSITIVE SARS-CoV-2 target nucleic acids are DETECTED. The SARS-CoV-2 RNA is generally detectable in upper and lower  respiratory specimens dur ing the acute phase of infection.  Positive  results are indicative of active infection with SARS-CoV-2.  Clinical  correlation with patient history and other diagnostic information is  necessary to determine patient infection status.  Positive results do  not rule out bacterial infection or co-infection with other viruses. If result is PRESUMPTIVE POSTIVE SARS-CoV-2 nucleic acids MAY BE PRESENT.   A presumptive positive result was obtained on the submitted specimen  and confirmed on repeat testing.  While 2019 novel coronavirus  (SARS-CoV-2) nucleic acids may be present in the submitted sample  additional confirmatory testing may be necessary for epidemiological  and / or clinical management purposes  to differentiate between  SARS-CoV-2 and other Sarbecovirus currently known to infect humans.  If clinically indicated additional  testing with an alternate test  methodology [phone]) is advised. The SARS-CoV-2 RNA is generally  detectable in upper and lower respiratory sp ecimens during the acute  phase of infection. The expected result is Negative. Fact Sheet for Patients:  StrictlyIdeas.no Fact Sheet for Healthcare Providers: BankingDealers.co.za This test is not yet approved or cleared by the [place] FDA and has been authorized for detection and/or diagnosis of SARS-CoV-2 by FDA under an Emergency Use Authorization (EUA).  This EUA will remain in effect (meaning this test can be used) for the duration of the COVID-19 declaration under Section 564(b)(1) of the Act, 21 U.S.C. section 360bbb-3(b)(1), unless the authorization is terminated or revoked sooner. Performed at [facility], [address] [place], [place] [address]   Culture, blood (  Routine X 2) w Reflex to ID Panel     Status: None   Collection Time: 02/20/19  2:24 PM   Specimen: Right Antecubital; Blood  Result Value Ref Range Status   Specimen Description   Final    RIGHT ANTECUBITAL BOTTLES DRAWN AEROBIC AND ANAEROBIC   Special Requests Blood Culture adequate volume  Final   Culture   Final    NO GROWTH 5 DAYS Performed at [facility], [address], [place], [place] [address]    Report Status 02/25/2019 FINAL  Final  MRSA PCR Screening     Status: None   Collection Time: 02/20/19  5:15 PM   Specimen: Nasopharyngeal  Result Value Ref Range Status   MRSA by PCR NEGATIVE NEGATIVE Final    Comment:        The GeneXpert MRSA Assay (FDA approved for NASAL specimens only), is one component of a comprehensive MRSA colonization surveillance program. It is not intended to diagnose MRSA infection nor to guide or monitor treatment for MRSA infections. Performed at [facility], [place] [address] [place], [place] [address]   Culture, respiratory (tracheal aspirate)     Status: None    Collection Time: 02/20/19  6:11 PM   Specimen: Tracheal Aspirate; Respiratory  Result Value Ref Range Status   Specimen Description TRACHEAL ASPIRATE  Final   Special Requests NONE  Final   Gram Stain   Final    ABUNDANT WBC PRESENT,BOTH PMN AND MONONUCLEAR FEW GRAM VARIABLE ROD MODERATE YEAST Performed at [facility], [place] [address], [place], [place] [address]    Culture FEW CANDIDA GLABRATA  Final   Report Status 02/23/2019 FINAL  Final  Culture, blood (routine x 2)     Status: Abnormal   Collection Time: 02/20/19  6:26 PM   Specimen: BLOOD RIGHT HAND  Result Value Ref Range Status   Specimen Description BLOOD RIGHT HAND  Final   Special Requests   Final    AEROBIC BOTTLE ONLY Blood Culture results may not be optimal due to an inadequate volume of blood received in culture bottles   Culture  Setup Time   Final    GRAM POSITIVE COCCI IN CLUSTERS AEROBIC BOTTLE ONLY CRITICAL RESULT CALLED TO, READ BACK BY AND VERIFIED WITH: PHARMD TYLER BAUMEISTER [id number] FCP    Culture (A)  Final    STAPHYLOCOCCUS SPECIES (COAGULASE NEGATIVE) THE SIGNIFICANCE OF ISOLATING THIS ORGANISM FROM A SINGLE SET OF BLOOD CULTURES WHEN MULTIPLE SETS ARE DRAWN IS UNCERTAIN. PLEASE NOTIFY THE MICROBIOLOGY DEPARTMENT WITHIN ONE WEEK IF SPECIATION AND SENSITIVITIES ARE REQUIRED. Performed at [facility], [place] [address], [place], [place] [address]    Report Status 02/23/2019 FINAL  Final  Blood Culture ID Panel (Reflexed)     Status: Abnormal   Collection Time: 02/20/19  6:26 PM  Result Value Ref Range Status   Enterococcus species NOT DETECTED NOT DETECTED Final   Listeria monocytogenes NOT DETECTED NOT DETECTED Final   Staphylococcus species DETECTED (A) NOT DETECTED Final    Comment: Methicillin (oxacillin) resistant coagulase negative staphylococcus. Possible blood culture contaminant (unless isolated from more than one blood culture draw or clinical case suggests pathogenicity). No  antibiotic treatment is indicated for blood  culture contaminants. CRITICAL RESULT CALLED TO, READ BACK BY AND VERIFIED WITH: PHARMD TYLER BAUMEISTER [id number] FCP    Staphylococcus aureus (BCID) NOT DETECTED NOT DETECTED Final   Methicillin resistance DETECTED (A) NOT DETECTED Final  Comment: CRITICAL RESULT CALLED TO, READ BACK BY AND VERIFIED WITH: PHARMD TYLER BAUMEISTER [id number] FCP    Streptococcus species NOT DETECTED NOT DETECTED Final   Streptococcus agalactiae NOT DETECTED NOT DETECTED Final   Streptococcus pneumoniae NOT DETECTED NOT DETECTED Final   Streptococcus pyogenes NOT DETECTED NOT DETECTED Final   Acinetobacter baumannii NOT DETECTED NOT DETECTED Final   Enterobacteriaceae species NOT DETECTED NOT DETECTED Final   Enterobacter cloacae complex NOT DETECTED NOT DETECTED Final   Escherichia coli NOT DETECTED NOT DETECTED Final   Klebsiella oxytoca NOT DETECTED NOT DETECTED Final   Klebsiella pneumoniae NOT DETECTED NOT DETECTED Final   Proteus species NOT DETECTED NOT DETECTED Final   Serratia marcescens NOT DETECTED NOT DETECTED Final   Haemophilus influenzae NOT DETECTED NOT DETECTED Final   Neisseria meningitidis NOT DETECTED NOT DETECTED Final   Pseudomonas aeruginosa NOT DETECTED NOT DETECTED Final   Candida albicans NOT DETECTED NOT DETECTED Final   Candida glabrata NOT DETECTED NOT DETECTED Final   Candida krusei NOT DETECTED NOT DETECTED Final   Candida parapsilosis NOT DETECTED NOT DETECTED Final   Candida tropicalis NOT DETECTED NOT DETECTED Final    Comment: Performed at [facility], [place]. [address], [place], [place] [address]  Culture, blood (routine x 2)     Status: None   Collection Time: 02/20/19  7:01 PM   Specimen: BLOOD RIGHT HAND  Result Value Ref Range Status   Specimen Description BLOOD RIGHT HAND  Final   Special Requests   Final    AEROBIC BOTTLE ONLY Blood Culture results may not be optimal due to an inadequate volume of blood  received in culture bottles   Culture   Final    NO GROWTH 5 DAYS Performed at [facility], [place] [address], [place], [place] [address]    Report Status 02/25/2019 FINAL  Final  Gram stain     Status: None   Collection Time: 02/21/19 11:26 AM   Specimen: Pleura  Result Value Ref Range Status   Specimen Description PLEURAL RIGHT  Final   Special Requests NONE  Final   Gram Stain   Final    WBC PRESENT,BOTH PMN AND MONONUCLEAR NO ORGANISMS SEEN CYTOSPIN SMEAR Performed at [facility], [address], [place], [place] [address]    Report Status 02/21/2019 FINAL  Final  Culture, body fluid-bottle     Status: None   Collection Time: 02/21/19 11:26 AM   Specimen: Pleura  Result Value Ref Range Status   Specimen Description PLEURAL RIGHT  Final   Special Requests NONE  Final   Culture   Final    NO GROWTH 5 DAYS Performed at [place] [address], [place], [place] [address]    Report Status 02/26/2019 FINAL  Final         Radiology Studies: Dg Abd 1 View  Result Date: 02/25/2019 CLINICAL DATA:  NG tube placement. EXAM: ABDOMEN - 1 VIEW COMPARISON:  Chest x-ray from same date. FINDINGS: Diffuse gaseous distention of bowel noted without overt obstructive pattern. NG tube tip is in the mid stomach. Left pneumothorax better visualized on chest x-ray from earlier same day. Telemetry leads overlie the chest. IMPRESSION: NG tube tip is positioned in the mid stomach. Electronically Signed   By: Misty Stanley M.D.   On: 02/25/2019 09:13   Dg Chest Port 1 View  Result Date: 02/26/2019 CLINICAL DATA:  Respiratory failure. EXAM: PORTABLE CHEST 1 VIEW  COMPARISON:  One-view chest x-ray 02/25/2019 FINDINGS: Heart size is normal. Left-sided pneumothorax is stable. Interstitial and airspace disease in the right lung has improved. Right IJ line is stable. NG tube courses off the inferior border the film. Residual contrast is noted in the stomach. IMPRESSION: 1. Stable  left-sided pneumothorax. 2. Improving aeration of the right lung. Electronically Signed   By: San Morelle M.D.   On: 02/26/2019 07:40   Dg Chest Port 1 View  Result Date: 02/25/2019 CLINICAL DATA:  Respiratory failure EXAM: PORTABLE CHEST 1 VIEW COMPARISON:  02/24/2019 FINDINGS: Interval extubation. Right central line and NG tube remain in place, unchanged. New atelectasis in the right lower lobe. Improved aeration in the right upper lobe. Left upper lobe airspace opacity in left hydropneumothorax, unchanged. IMPRESSION: Interval extubation. New right lower lobe atelectasis/collapse. Improved aeration in the right upper lobe. Stable left hydropneumothorax. Electronically Signed   By: Rolm Baptise M.D.   On: 02/25/2019 10:08   Dg Abd Portable 1v  Result Date: 02/25/2019 CLINICAL DATA:  NG tube placement EXAM: PORTABLE ABDOMEN - 1 VIEW COMPARISON:  February 24, 2019 FINDINGS: The NG tube projects over the gastric antrum/pylorus. The tip is pointed distally. There is some oral contrast within the stomach. The bowel gas pattern is nonspecific with gaseous distention of loops of small bowel and colon scattered throughout the abdomen. There is some oral contrast in the left lower quadrant. IMPRESSION: Enteric tube projects over the gastric antrum/pylorus with the tip pointed distally. Electronically Signed   By: Constance Holster M.D.   On: 02/25/2019 17:55   Dg Swallowing Func-speech Pathology  Result Date: 02/25/2019 Objective Swallowing Evaluation: Type of Study: MBS-Modified Barium Swallow Study  Patient Details Name: Keven Pyles MRN: [id number] Date of Birth: [birth year] Today's Date: 02/25/2019 Time: SLP Start Time (ACUTE ONLY): 1310 -SLP Stop Time (ACUTE ONLY): 1330 SLP Time Calculation (min) (ACUTE ONLY): 20 min Past Medical History: Past Medical History: Diagnosis Date . Anemia  . CHF (congestive heart failure) ([place])  . CVA (cerebral vascular accident) ([place])  . Diabetes mellitus without  complication ([place])  . ESRD (end stage renal disease) on dialysis ([place])  . Gastroparesis due to DM ([place])  . Multiple sclerosis ([place])  . Recurrent left pleural effusion  . Tobacco abuse  Past Surgical History: Past Surgical History: Procedure Laterality Date . BIOPSY  01/31/2019  Procedure: BIOPSY;  Surgeon: Jerene Bears, MD;  Location: [facility] ENDOSCOPY;  Service: Gastroenterology;; . ENTEROSCOPY N/A 01/31/2019  Procedure: ENTEROSCOPY;  Surgeon: Jerene Bears, MD;  Location: [place];  Service: Gastroenterology;  Laterality: N/A; . VIDEO ASSISTED THORACOSCOPY (VATS)/DECORTICATION   HPI: Pt adm from SNF with AMS and hypoglycemia (blood sugar 31). Pt vomited and aspirated and intubated 8/15-8/19. PMH - ESRD on HD, DM, diastolic HF, HTN, multiple sclerosis, CVA with residual lt sided weakness, chronic lt pleural effusion, cdiff, diabetic neuropathy, depression, lt VATS  Subjective: alert, only says "yeah" Assessment / Plan / Recommendation CHL IP CLINICAL IMPRESSIONS 02/25/2019 Clinical Impression Patient presents with a severe oral and a moderate pharyngeal phase dysphagia. He was only able to transit a small amount of one of the puree boluses tested after a considerable amount of time holding orally as well as significantly reduced lingual manipulation and anterior to posterior transit. Majority of boluses were suctioned out of oral cavity. He exhibited oral delays with nectar liquids and thin liquids as well, but with some improvement. Thin liquids resulted in silent aspiration during the swallow and puree solids  and nectar thick liquids resulted in vallecular residuals. Patient currently not safe for PO's and will require alternative means of nutrition. SLP Visit Diagnosis Dysphagia, oropharyngeal phase (R13.12) Attention and concentration deficit following -- Frontal lobe and executive function deficit following -- Impact on safety and function Severe aspiration risk;Risk for inadequate nutrition/hydration   CHL IP  TREATMENT RECOMMENDATION 02/25/2019 Treatment Recommendations Therapy as outlined in treatment plan below;F/U MBS in --- days (Comment)   Prognosis 02/25/2019 Prognosis for Safe Diet Advancement Guarded Barriers to Reach Goals Cognitive deficits;Time post onset;Severity of deficits Barriers/Prognosis Comment -- CHL IP DIET RECOMMENDATION 02/25/2019 SLP Diet Recommendations NPO Liquid Administration via -- Medication Administration Via alternative means Compensations -- Postural Changes --   CHL IP OTHER RECOMMENDATIONS 02/25/2019 Recommended Consults -- Oral Care Recommendations Oral care QID Other Recommendations --   CHL IP FOLLOW UP RECOMMENDATIONS 02/25/2019 Follow up Recommendations Skilled Nursing facility   [facility] IP FREQUENCY AND DURATION 02/25/2019 Speech Therapy Frequency (ACUTE ONLY) min 2x/week Treatment Duration 2 weeks      CHL IP ORAL PHASE 02/25/2019 Oral Phase Impaired Oral - Pudding Teaspoon -- Oral - Pudding Cup -- Oral - Honey Teaspoon -- Oral - Honey Cup -- Oral - Nectar Teaspoon Weak lingual manipulation;Delayed oral transit Oral - Nectar Cup Weak lingual manipulation;Reduced posterior propulsion;Delayed oral transit Oral - Nectar Straw NT Oral - Thin Teaspoon Delayed oral transit;Weak lingual manipulation;Reduced posterior propulsion Oral - Thin Cup Delayed oral transit;Weak lingual manipulation;Reduced posterior propulsion Oral - Thin Straw NT Oral - Puree Weak lingual manipulation;Incomplete tongue to palate contact;Reduced posterior propulsion;Delayed oral transit;Decreased bolus cohesion Oral - Mech Soft -- Oral - Regular -- Oral - Multi-Consistency -- Oral - Pill -- Oral Phase - Comment --  CHL IP PHARYNGEAL PHASE 02/25/2019 Pharyngeal Phase Impaired Pharyngeal- Pudding Teaspoon -- Pharyngeal -- Pharyngeal- Pudding Cup -- Pharyngeal -- Pharyngeal- Honey Teaspoon -- Pharyngeal -- Pharyngeal- Honey Cup -- Pharyngeal -- Pharyngeal- Nectar Teaspoon Delayed swallow initiation-vallecula;Pharyngeal residue  - valleculae Pharyngeal Material does not enter airway Pharyngeal- Nectar Cup Delayed swallow initiation-vallecula;Pharyngeal residue - valleculae Pharyngeal Material does not enter airway Pharyngeal- Nectar Straw NT Pharyngeal -- Pharyngeal- Thin Teaspoon Delayed swallow initiation-vallecula;Penetration/Aspiration during swallow Pharyngeal Material enters airway, passes BELOW cords without attempt by patient to eject out (silent aspiration) Pharyngeal- Thin Cup Delayed swallow initiation-vallecula;Penetration/Aspiration during swallow Pharyngeal Material enters airway, passes BELOW cords without attempt by patient to eject out (silent aspiration) Pharyngeal- Thin Straw NT Pharyngeal -- Pharyngeal- Puree Delayed swallow initiation-vallecula;Pharyngeal residue - valleculae Pharyngeal -- Pharyngeal- Mechanical Soft -- Pharyngeal -- Pharyngeal- Regular -- Pharyngeal -- Pharyngeal- Multi-consistency -- Pharyngeal -- Pharyngeal- Pill -- Pharyngeal -- Pharyngeal Comment --  CHL IP CERVICAL ESOPHAGEAL PHASE 02/25/2019 Cervical Esophageal Phase WFL Pudding Teaspoon -- Pudding Cup -- Honey Teaspoon -- Honey Cup -- Nectar Teaspoon -- Nectar Cup -- Nectar Straw -- Thin Teaspoon -- Thin Cup -- Thin Straw -- Puree -- Mechanical Soft -- Regular -- Multi-consistency -- Pill -- Cervical Esophageal Comment -- Johnphilip, Enge 02/25/2019, 5:00 PM    Sonia Baller, MA, CCC-SLP Speech Therapy [facility] Pager: [phone]               Scheduled Meds: . amLODipine  5 mg Oral BID  . aspirin  81 mg Oral Daily  . carvedilol  25 mg Oral BID WC  . chlorhexidine  15 mL Mouth Rinse BID  . Chlorhexidine Gluconate Cloth  6 each Topical Daily  . Chlorhexidine Gluconate Cloth  6 each Topical Q0600  .  cloNIDine  0.2 mg Oral TID  . [START ON 03/02/2019] darbepoetin (ARANESP) injection - DIALYSIS  100 mcg Intravenous Q Tue-HD  . docusate  100 mg Oral Daily  . enoxaparin (LOVENOX) injection  30 mg Subcutaneous Q24H  . feeding  supplement (PRO-STAT SUGAR FREE 64)  30 mL Per Tube BID  . hydrALAZINE  100 mg Oral Q8H  . insulin aspart  1-3 Units Subcutaneous Q4H  . lisinopril  20 mg Oral Daily  . mouth rinse  15 mL Mouth Rinse q12n4p  . multivitamin  1 tablet Oral QHS  . OLANZapine  5 mg Oral QHS  . pantoprazole sodium  40 mg Per Tube Daily   Continuous Infusions: . sodium chloride    . sodium chloride    . sodium chloride    . sodium chloride    . feeding supplement (VITAL 1.5 CAL) 45 mL/hr at 02/26/19 1105  . piperacillin-tazobactam (ZOSYN)  IV Stopped (02/26/19 0550)     LOS: 6 days    Time spent: 66min    Domenic Polite, MD [facility] Page via [email or web address], password [email or web address] After 7PM please contact night-coverage  02/26/2019, 12:48 PM

## 2019-02-26 NOTE — Progress Notes (Signed)
Spoke w/ pts wife to provide updates. Pts wife appreciative.  

## 2019-02-26 NOTE — Progress Notes (Signed)
Assisted tele visit to patient with daughter.  Curlie Sittner P, RN  

## 2019-02-26 NOTE — Progress Notes (Addendum)
Reason for consult: Altered mental status   Subjective: Started on a trial of Haldol yesterday with resolution of choreoathetosis. Alert this AM, but remains confused. Speech unchanged from yesterday.    ROS: negative except above   Examination  Vital signs in last 24 hours: Temp:  [97.6 F (36.4 C)-99.9 F (37.7 C)] 97.9 F (36.6 C) (08/21 0800) Pulse Rate:  [78-110] 88 (08/21 0700) Resp:  [17-33] 19 (08/21 0700) BP: (152-200)/(55-90) 174/70 (08/21 0700) SpO2:  [92 %-100 %] 100 % (08/21 0700) Weight:  [58 kg] 58 kg (08/21 0428)  General: lying in bed watching TV  CVS: pulse-normal rate and rhythm RS: breathing comfortably Extremities: normal   Neuro: MS: Alert, oriented only to self, thinks he is at home, follows commands CN: pupils equal and reactive,  EOMI, face symmetric, tongue midline, normal sensation over face, Motor: 5/5 strength on UE, 4/5 on LE  Reflexes: brisk  Coordination: not tested  Gait: not tested  Basic Metabolic Panel: Recent Labs  Lab 02/22/19 0330 02/23/19 0322 02/24/19 0337 02/24/19 0347 02/25/19 0504 02/25/19 0722 02/26/19 0413  NA 132* 130* 132* 133* 134* 133* 135  K 4.0 3.8 3.4* 3.5 3.6 3.6 2.9*  CL 93* 90*  --  94* 93* 94* 96*  CO2 27 26  --  27 26 24 26   GLUCOSE 178* 128*  --  198* 115* 111* 170*  BUN 26* 49*  --  29* 45* 48* 29*  CREATININE 5.05* 6.20*  --  3.75* 5.44* 5.49* 3.86*  CALCIUM 8.7* 8.7*  --  9.1 9.2 9.5 9.1  MG 2.0 2.1  --  2.2 2.3  --  2.3  PHOS 5.0* 4.1  --  2.3* 3.0 3.2 2.9    CBC: Recent Labs  Lab 02/20/19 0821  02/20/19 1901  02/21/19 0440  02/22/19 0330 02/23/19 0322 02/24/19 0337 02/24/19 0347 02/25/19 0722  WBC 10.1  --  12.3*  --  17.8*  --  14.1* 12.5*  --  9.9 10.2  NEUTROABS 9.2*  --  11.3*  --   --   --   --   --   --   --   --   HGB 12.5*   < > 12.4*   < > 8.1*   < > 8.2* 7.9* 8.5* 8.0* 8.4*  HCT 42.1   < > 39.3   < > 26.4*   < > 27.0* 24.7* 25.0* 25.7* 26.0*  MCV 83.0  --  82.2  --  82.8   --  83.6 80.2  --  81.6 79.5*  PLT 335  --  226  --  217  --  194 189  --  204 253   < > = values in this interval not displayed.     Coagulation Studies: No results for input(s): LABPROT, INR in the last 72 hours.  Imaging No new imaging to review.    ASSESSMENT AND PLAN  1. Acute toxic/metabolic encephalopathy -Mentation stable from yesterday. Speech somewhat difficult to comprehend but nods yes/no appropriately to questions.   2. MS-Follows up at [facility]. On glatiramer at home which is being held in the setting of infection.  3. Hypoglycemia - resolved. Having issues tolerating PO intake. Speech recommended continuing NPO.    4. 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 Recommend switching to PO atypical antipsychotic. Started low dose zyprexa. Consider referral to movement disorder specialist on discharge.  Final recommendations per neurology attending, Dr. Lorraine Lax.  Welford Roche, MD  Internal Medicine PGY-3  P [phone]   NEUROHOSPITALIST ADDENDUM Performed a face to face diagnostic evaluation.   I have reviewed the contents of history and physical exam as documented by PA/ARNP/Resident and agree with above documentation.  I have discussed and formulated the above plan as documented. Edits to the note have been made as needed.  Patient's orofacial dyskinesia and chorea has significantly improved after starting Haldol.  Will transition to low-dose Zyprexa.  Patient on  Bentyl, doubt withdrawal of the symptoms have caused this as patient was having this before he presented to the hospital, is it possible this could be responsible for his dyskinesia and abrupt withdrawal could make it worse. Recommend outpatient follow up with Neurology/movement disorders.     Karena Addison  MD [facility] [phone]   If 7pm to 7am, please  call on call as listed on AMION.

## 2019-02-26 NOTE — Progress Notes (Signed)
[facility] Progress Note Patient Name: Cole Nelson DOB: February 16, 1956 MRN: [id number]   Date of Service  02/26/2019  HPI/Events of Note  K+ 2.9, Pt has ESRD-DD  eICU Interventions  KCL 10 meq iv Q 1 hour x 2        Iram Astorino U Angel Weedon 02/26/2019, 6:45 AM

## 2019-02-26 NOTE — Progress Notes (Signed)
[facility]    NEPHROLOGY PROGRESS NOTE  SUBJECTIVE:  Had HD on 8/20 with 1.4 kg UF.  Missed his AM coreg on 8/20 and was hypertensive.  Failed his swallow study yesterday and remains on tube feeds.  He is not able to provide additional history regarding his graft  Review of systems:   Not taking PO  Denies overt shortness of breath  Denies n/v Stronger cough per nursing but still weak   OBJECTIVE:  Vitals:   02/26/19 0530 02/26/19 0600  BP: (!) 191/77 (!) 168/73  Pulse: 83 81  Resp: (!) 21 20  Temp:    SpO2: 100% 100%    Intake/Output Summary (Last 24 hours) at 02/26/2019 [id number] Last data filed at 02/26/2019 0600 Gross per 24 hour  Intake 378.33 ml  Output 1400 ml  Net -1021.67 ml      General: adult male in bed  HEENT: MMM [place] AT anicteric sclera CV:  RRR no rub  Lungs:  coarse breath sounds bilaterally Abd:  abd SNT/ND  Extremities: no lower extremity edema  Neuro: awake and interactive Access: left upper extremity AV graft with thrill and bruit.  Part of bandage is off now and aneursymal portion with light-colored/shiny aspect   MEDICATIONS:  . amLODipine  5 mg Oral BID  . aspirin  81 mg Oral Daily  . carvedilol  25 mg Oral BID WC  . chlorhexidine  15 mL Mouth Rinse BID  . Chlorhexidine Gluconate Cloth  6 each Topical Daily  . Chlorhexidine Gluconate Cloth  6 each Topical Q0600  . cloNIDine  0.2 mg Oral TID  . darbepoetin (ARANESP) injection - DIALYSIS  60 mcg Intravenous Q Tue-HD  . docusate  100 mg Oral Daily  . enoxaparin (LOVENOX) injection  30 mg Subcutaneous Q24H  . feeding supplement (PRO-STAT SUGAR FREE 64)  30 mL Per Tube BID  . haloperidol lactate  2 mg Intravenous BID  . hydrALAZINE  100 mg Oral Q8H  . insulin aspart  1-3 Units Subcutaneous Q4H  . lisinopril  10 mg Oral Daily  . mouth rinse  15 mL Mouth Rinse q12n4p  . multivitamin  1 tablet Oral QHS  . pantoprazole sodium  40 mg Per Tube Daily      LABS:  CBC Latest Ref Rng  & Units 02/25/2019 02/24/2019 02/24/2019  WBC 4.0 - 10.5 K/uL 10.2 9.9 -  Hemoglobin 13.0 - 17.0 g/dL 8.4(L) 8.0(L) 8.5(L)  Hematocrit 39.0 - 52.0 % 26.0(L) 25.7(L) 25.0(L)  Platelets 150 - 400 K/uL 253 204 -    CMP Latest Ref Rng & Units 02/26/2019 02/25/2019 02/25/2019  Glucose 70 - 99 mg/dL 170(H) 111(H) 115(H)  BUN 8 - 23 mg/dL 29(H) 48(H) 45(H)  Creatinine 0.61 - 1.24 mg/dL 3.86(H) 5.49(H) 5.44(H)  Sodium 135 - 145 mmol/L 135 133(L) 134(L)  Potassium 3.5 - 5.1 mmol/L 2.9(L) 3.6 3.6  Chloride 98 - 111 mmol/L 96(L) 94(L) 93(L)  CO2 22 - 32 mmol/L 26 24 26   Calcium 8.9 - 10.3 mg/dL 9.1 9.5 9.2  Total Protein 6.5 - 8.1 g/dL - - -  Total Bilirubin 0.3 - 1.2 mg/dL - - -  Alkaline Phos 38 - 126 U/L - - -  AST 15 - 41 U/L - - -  ALT 0 - 44 U/L - - -    Lab Results  Component Value Date   CALCIUM 9.1 02/26/2019   CAION 1.23 02/24/2019   PHOS 2.9 02/26/2019    No results found for:  COLORURINE, APPEARANCEUR, LABSPEC, PHURINE, GLUCOSEU, HGBUR, BILIRUBINUR, Cyril Loosen, NITRITE, LEUKOCYTESUR    Component Value Date/Time   PHART 7.513 (H) 02/24/2019 0337   PCO2ART 35.9 02/24/2019 0337   PO2ART 79.0 (L) 02/24/2019 0337   HCO3 28.8 (H) 02/24/2019 0337   TCO2 30 02/24/2019 0337   O2SAT 97.0 02/24/2019 0337       Component Value Date/Time   IRON 75 02/03/2019 1117   TIBC 169 (L) 02/03/2019 1117   FERRITIN 1,429 (H) 02/03/2019 1117   IRONPCTSAT 44 (H) 02/03/2019 1117       ASSESSMENT/PLAN:     1.  End-stage renal disease on hemodialysis.  - Continue HD per TTS schedule  - Will need vascular to assess AV graft   2.  Acute hypoxemic respiratory failure.  Likely secondary to pneumonitis.  History of left pleural effusion status post left VATS in July 2020 with loculated basilar left hydro-with pneumothorax.  S/p Thoracentesis. abx per primary team.  Extubated    3.  HTN. Continue carvedilol. Continue clonidine 0.2 mg TID (compared with home 0.3 mg BID).   Continue hydralazine 100 mg TID.  Increased amlodipine back to 5 mg BID per reported home dosing.  Continue lisinopril 10 mg daily - Increase lisinopril from 10 mg to 20 mg daily  4.  Acute metabolic encephalopathy secondary to hypoglycemia.  Team is monitoring blood sugars closely.  mental status improving.   5.  Anemia of chronic kidney disease.   Monitor serial hemoglobins.  Increased ESA to 100 mcg for next dose.  Trend.  Is iron replete.   6. hx Hyperphosphatemia.  acceptable off of phoslo for now.  (off with mild hypophos).  Follow.  Would change to nepro for tube feeds   7.  Hypokalemia - 3K bath was ordered; replete - 30 meq PO once   Claudia Desanctis 02/26/2019 6:32 AM

## 2019-02-26 NOTE — Progress Notes (Addendum)
Nutrition Follow-up  DOCUMENTATION CODES:   Severe malnutrition in context of chronic illness  INTERVENTION:   Tube Feeding:  Vital 1.5 at 45 ml/hr Pro-Stat 30 mL BID Provides 103 g of protein, 1820 kcals and 821 mL of free water Meets 100% estimated calorie and protein needs  See below for discussion on reasoning behind not switching to Nepro (renal formula) at this time   NUTRITION DIAGNOSIS:   Severe Malnutrition related to chronic illness(ESRD) as evidenced by severe muscle depletion, severe fat depletion.  Being addressed via TF   GOAL:   Patient will meet greater than or equal to 90% of their needs  Met  MONITOR:   TF tolerance, Diet advancement, Weight trends, Skin, Labs  REASON FOR ASSESSMENT:   Ventilator, Consult Enteral/tube feeding initiation and management  ASSESSMENT:   63 yo male admitted from SNF with AMS, glucose 31. Vomited and aspirated, requiring intubation. PMH includes gastroparesis, ESRD-HD, MS, DM, CVA, HTN, L pleural effusion s/p VATS 01/18/2019.  8/19 Extubated 8/20 MBS with severe oral and moderate pharyngeal phase dysphagia, NPO recommended; Palliative Care consulted  NG tube remains in place, Vital 1.5 TF continues  Received another consult to switch to Nepro TF formula which is low in potassium and phosphorus. Potassium low and requiring supplementation/increased K+ bath during HD and phosphorus currently 2.9 which is considered low for HD. Phosphorus binders currently on hold. Also pt with severe gastroparesis with vomiting; Nepro formula is high in fat and contains fiber and may further delay gastric emptying and not be tolerated well  Discussed with Attending MD and Nephrologist; will continue current TF for now; monitor closely  NG tube with tip in antrum/pylorus; discussed possibility fo changing NG out for Cortrak with MD; plan to hold off until after palliative care discusssion  Unsure of EDW; lowest dry weight post HD 56.8  kg  Labs: potassium 2.9 (L), phosphorus 2.9 (considered low for HD), sodium wdl Meds: Rena-Vit, ss novolog, aranesp   Diet Order:   Diet Order            Diet NPO time specified  Diet effective now              EDUCATION NEEDS:   Not appropriate for education at this time  Skin:  Skin Assessment: Reviewed RN Assessment Skin Integrity Issues:: Stage II Stage II: coccyx  Last BM:  8/18  Height:   Ht Readings from Last 1 Encounters:  02/20/19 '5\' 11"'$  (1.803 m)    Weight:   Wt Readings from Last 1 Encounters:  02/26/19 58 kg    Ideal Body Weight:  78.2 kg  BMI:  Body mass index is 17.83 kg/m.  Estimated Nutritional Needs:   Kcal:  1800-2000 kcals  Protein:  86-105 g  Fluid:  1 L + UOP   [employer] MS, RDN, LDN, CNSC [phone] Pager  [phone] Weekend/On-Call Pager

## 2019-02-27 DIAGNOSIS — N186 End stage renal disease: Secondary | ICD-10-CM

## 2019-02-27 DIAGNOSIS — J939 Pneumothorax, unspecified: Secondary | ICD-10-CM

## 2019-02-27 DIAGNOSIS — Z515 Encounter for palliative care: Secondary | ICD-10-CM

## 2019-02-27 DIAGNOSIS — R400 Somnolence: Secondary | ICD-10-CM

## 2019-02-27 DIAGNOSIS — J690 Pneumonitis due to inhalation of food and vomit: Secondary | ICD-10-CM

## 2019-02-27 DIAGNOSIS — G35 Multiple sclerosis: Secondary | ICD-10-CM

## 2019-02-27 LAB — COMPREHENSIVE METABOLIC PANEL
ALT: 12 U/L (ref 0–44)
AST: 16 U/L (ref 15–41)
Albumin: 2 g/dL — ABNORMAL LOW (ref 3.5–5.0)
Alkaline Phosphatase: 80 U/L (ref 38–126)
Anion gap: 11 (ref 5–15)
BUN: 40 mg/dL — ABNORMAL HIGH (ref 8–23)
CO2: 28 mmol/L (ref 22–32)
Calcium: 9.5 mg/dL (ref 8.9–10.3)
Chloride: 98 mmol/L (ref 98–111)
Creatinine, Ser: 5.27 mg/dL — ABNORMAL HIGH (ref 0.61–1.24)
GFR calc Af Amer: 12 mL/min — ABNORMAL LOW (ref >60)
GFR calc non Af Amer: 11 mL/min — ABNORMAL LOW (ref >60)
Glucose, Bld: 142 mg/dL — ABNORMAL HIGH (ref 70–99)
Potassium: 3.8 mmol/L (ref 3.5–5.1)
Sodium: 137 mmol/L (ref 135–145)
Total Bilirubin: 0.7 mg/dL (ref 0.3–1.2)
Total Protein: 6.2 g/dL — ABNORMAL LOW (ref 6.5–8.1)

## 2019-02-27 LAB — CBC
HCT: 28.4 % — ABNORMAL LOW (ref 39.0–52.0)
Hemoglobin: 8.7 g/dL — ABNORMAL LOW (ref 13.0–17.0)
MCH: 25.1 pg — ABNORMAL LOW (ref 26.0–34.0)
MCHC: 30.6 g/dL (ref 30.0–36.0)
MCV: 82.1 fL (ref 80.0–100.0)
Platelets: 236 10*3/uL (ref 150–400)
RBC: 3.46 MIL/uL — ABNORMAL LOW (ref 4.22–5.81)
RDW: 15.8 % — ABNORMAL HIGH (ref 11.5–15.5)
WBC: 9 10*3/uL (ref 4.0–10.5)
nRBC: 0.2 % (ref 0.0–0.2)

## 2019-02-27 LAB — GLUCOSE, CAPILLARY
Glucose-Capillary: 124 mg/dL — ABNORMAL HIGH (ref 70–99)
Glucose-Capillary: 137 mg/dL — ABNORMAL HIGH (ref 70–99)
Glucose-Capillary: 181 mg/dL — ABNORMAL HIGH (ref 70–99)
Glucose-Capillary: 203 mg/dL — ABNORMAL HIGH (ref 70–99)
Glucose-Capillary: 105 mg/dL — ABNORMAL HIGH (ref 70–99)

## 2019-02-27 MED ORDER — PIPERACILLIN-TAZOBACTAM 3.375 G IVPB
3.3750 g | Freq: Two times a day (BID) | INTRAVENOUS | Status: DC
  Administered 2019-02-27 – 2019-02-28 (×3): 3.375 g via INTRAVENOUS
  Filled 2019-02-27 (×4): qty 50

## 2019-02-27 NOTE — Consult Note (Signed)
Consultation Note Date: 02/27/2019   Patient Name: Cole Nelson  DOB: 1956-03-10  MRN: [id number]  Age / Sex: 63 y.o., male  PCP: Caprice Renshaw, MD Referring Physician: Domenic Polite, MD  Reason for Consultation: Establishing goals of care and Psychosocial/spiritual support  HPI/Patient Profile: 63 y.o. male "Cole Nelson " with past medical history of ESRD on HD, advanced multiple sclerosis, DM with gastroparesis, CVA with left sided hemiparesis (0000000), diastolic dysfunction who was admitted on 02/20/2019 with respiratory distress secondary to aspiration pneumonia.  He had a CBG of 31.  He required intubation. He is now extubated but continues to have dysphagia and altered mental status.  On 8/16 a parapneumonic effusion was drained and found to be exudative.  He is currently receiving tube feeds.  His swallow evaluation concerning for aspiration.  Of note he was hospitalized from 7/23 - 8/12 with vomiting from gastroparesis.  On 7/13 he underwent VATS for a left pleural effusion.  He now has a loculated left hydropneumothorax with a possible pleural mass.  Clinical Assessment and Goals of Care:  I have reviewed medical records including EPIC notes, labs and imaging, received report from the ICU RN, assessed the patient and then spoke on the phone with Cole Nelson his wife to discuss diagnosis prognosis, [place], EOL wishes, disposition and options.  I introduced Palliative Medicine as specialized medical care for people living with serious illness. It focuses on providing relief from the symptoms and stress of a serious illness. The goal is to improve quality of life for both the patient and the family.  We discussed a brief life review of the patient.  Cole describes Cole Nelson as a jokester who loves people.  He speaks to everyone he meets.  When he was able he was extremely active in church, standing in the  chorus and ushered.   He is [employer], but willing to go to any type of church.  Cole and Cole Nelson have been married over 68 years.  Cole Nelson has 1 daughter by a previous marriage.  She lives in [place].  Together they have 7 grandchildren that they are very close to.  As far as functional and nutritional status at home prior to admission he was able to partially dress himself.  Cole helped him with most of his ADLs.  She relates to me that he has lost approximately 40 pounds since his hospitalization in July.  We discussed his current illness and what it means in the larger context of his on-going co-morbidities.  Natural disease trajectory and expectations at EOL were discussed.  Specifically we talked about end-stage multiple sclerosis during which people lose the strength to swallow correctly and develop recurrent aspiration pneumonia.  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  She is familiar with aspiration pneumonia.  Cole also helped multiple family members through end-of-life.  She is supported by a niece who is a doctor and an aunt who is a Marine scientist.  She understands he is nearing end-of-life.     The difference between aggressive medical  intervention and comfort care was considered in light of the patient's goals of care.  Cole stated she is not ready to discontinue hemodialysis.  We discussed his bed bound status and his severe aspiration risk.  She had stated previously that she did not want to place a tracheostomy or PEG.  She tells me that Cole Nelson would hate being in Rehab again.  It is most important to her to get him home.   I asked her about comfort feeds despite aspiration.  She was not ready to make that decision yet.  We has a long talk about hospice services.  She is having difficulty deciding to end hemodialysis. Cole asked if she could speak with her family this evening and come to the hospital to meet with me tomorrow.  Hospice and Palliative Care services  outpatient were explained and offered.  Questions and concerns were addressed.  The family was encouraged to call with questions or concerns.    Primary Decision Maker:  NEXT OF KIN  Wife, Cole    SUMMARY OF RECOMMENDATIONS    No Trach  No PEG  DNR  Continue current care.  Wife coming to hospital 8/23 at 1:00 pm to discuss aspiration risk, feeding, whether or not to continue HD.   "The most important thing is that I get him home."    Given her experience and family support she could likely care for him in her home with help from Hospice.  However if she continues HD she will take him home with Home Health RN, PT, OT, SW, Palliative - in the [place], [place] area.  Code Status/Advance Care Planning:  DNR   Symptom Management:   Per primary team.   Psycho-social/Spiritual:   Desire for further Chaplaincy support: welcomed.  Prognosis: very poor as he is unable to maintain his nutritional needs (lost 40 lbs in 1.5 mos).  Extremely deconditioned.  Discharge Planning: Home with Palliative Services and home health vs home with Hospice in [place] area.      Primary Diagnoses: Present on Admission: . Respiratory failure with hypoxia ([place]) . AMS (altered mental status)   I have reviewed the medical record, interviewed the patient and family, and examined the patient. The following aspects are pertinent.  Past Medical History:  Diagnosis Date  . Anemia   . CHF (congestive heart failure) ([place])   . CVA (cerebral vascular accident) ([place])   . Diabetes mellitus without complication ([place])   . ESRD (end stage renal disease) on dialysis ([place])   . Gastroparesis due to DM ([place])   . Multiple sclerosis ([place])   . Recurrent left pleural effusion   . Tobacco abuse    Social History   Socioeconomic History  . Marital status: Married    Spouse name: Not on file  . Number of children: Not on file  . Years of education: Not on file  . Highest education level: Not on  file  Occupational History  . Not on file  Social Needs  . Financial resource strain: Not on file  . Food insecurity    Worry: Not on file    Inability: Not on file  . Transportation needs    Medical: Not on file    Non-medical: Not on file  Tobacco Use  . Smoking status: Unknown If Ever Smoked  Substance and Sexual Activity  . Alcohol use: Not Currently  . Drug use: Not Currently  . Sexual activity: Not Currently  Lifestyle  . Physical activity  Days per week: Not on file    Minutes per session: Not on file  . Stress: Not on file  Relationships  . Social Herbalist on phone: Not on file    Gets together: Not on file    Attends religious service: Not on file    Active member of club or organization: Not on file    Attends meetings of clubs or organizations: Not on file    Relationship status: Not on file  Other Topics Concern  . Not on file  Social History Narrative  . Not on file   History reviewed. No pertinent family history. Scheduled Meds: . amLODipine  5 mg Per Tube BID  . aspirin  81 mg Per Tube Daily  . carvedilol  25 mg Per Tube BID WC  . chlorhexidine  15 mL Mouth Rinse BID  . Chlorhexidine Gluconate Cloth  6 each Topical Daily  . Chlorhexidine Gluconate Cloth  6 each Topical Q0600  . Chlorhexidine Gluconate Cloth  6 each Topical Q0600  . cloNIDine  0.2 mg Per Tube TID  . [START ON 03/02/2019] darbepoetin (ARANESP) injection - DIALYSIS  100 mcg Intravenous Q Tue-HD  . dicyclomine  10 mg Per Tube BID  . docusate  100 mg Per Tube Daily  . enoxaparin (LOVENOX) injection  30 mg Subcutaneous Q24H  . feeding supplement (PRO-STAT SUGAR FREE 64)  30 mL Per Tube BID  . gabapentin  100 mg Per Tube QHS  . hydrALAZINE  100 mg Per Tube Q8H  . insulin aspart  1-3 Units Subcutaneous Q4H  . lisinopril  20 mg Per Tube Daily  . mouth rinse  15 mL Mouth Rinse q12n4p  . multivitamin  1 tablet Oral QHS  . OLANZapine  5 mg Per Tube QHS  . pantoprazole sodium   40 mg Per Tube Daily   Continuous Infusions: . sodium chloride    . sodium chloride    . sodium chloride    . sodium chloride    . feeding supplement (VITAL 1.5 CAL) 1,000 mL (02/26/19 2227)  . piperacillin-tazobactam (ZOSYN)  IV 2.25 g (02/27/19 0505)   PRN Meds:.sodium chloride, sodium chloride, sodium chloride, sodium chloride, acetaminophen (TYLENOL) oral liquid 160 mg/5 mL, bisacodyl, heparin, heparin, heparin, hydrALAZINE, ipratropium-albuterol, lidocaine (PF), lidocaine-prilocaine, pentafluoroprop-tetrafluoroeth Allergies  Allergen Reactions  . Baclofen     confusion  . Insulins     Patient states his mind started going in and out.    Review of Systems patient on HD, Mildly confused, lethargic.  Physical Exam  Frail cachectic male, lying in fetal position, on HD, awake, alert, is able to respond slowly with yes / no. CV tachy Resp no distress Abdomen thin, nt, nd  Vital Signs: BP (!) 133/55   Pulse 71   Temp 97.7 F (36.5 C) (Oral)   Resp (!) 22   Ht 5\' 11"  (1.803 m)   Wt 56.8 kg   SpO2 100%   BMI 17.46 kg/m  Pain Scale: 0-10   Pain Score: 0-No pain   SpO2: SpO2: 100 % O2 Device:SpO2: 100 % O2 Flow Rate: .O2 Flow Rate (L/min): 2 L/min  IO: Intake/output summary:   Intake/Output Summary (Last 24 hours) at 02/27/2019 0739 Last data filed at 02/27/2019 0500 Gross per 24 hour  Intake 1469.52 ml  Output -  Net 1469.52 ml    LBM: Last BM Date: 02/26/19 Baseline Weight: Weight: 61.2 kg Most recent weight: Weight: 56.8 kg  Palliative Assessment/Data: 20%     Time In: 9:00 Time Out: 10:14 Time Total: 74 min. Visit consisted of counseling and education dealing with the complex and emotionally intense issues surrounding the need for palliative care and symptom management in the setting of serious and potentially life-threatening illness. Greater than 50%  of this time was spent counseling and coordinating care related to the above assessment and plan.   Signed by: Florentina Jenny, PA-C Palliative Medicine Pager: [phone]  Please contact Palliative Medicine Team phone at [phone] for questions and concerns.  For individual provider: See Shea Evans

## 2019-02-27 NOTE — Progress Notes (Signed)
Assisted tele visit to patient with family member.  Lebron Nauert Gorden, RN   

## 2019-02-27 NOTE — Progress Notes (Signed)
PROGRESS NOTE    Cole Nelson  [id number] DOB: 1956/01/09 DOA: 02/20/2019 PCP: Caprice Renshaw, MD  Brief Narrative: This is a chronically ill 63 year old African-American male from SNF with ESRD on hemodialysis, advanced multiple sclerosis, type 2 diabetes mellitus, history of CVA/left hemiplegia, gastroparesis, history of left pleural effusion status post VATS in 01/18/19, chronic left pleural effusion. -He underwent VATS, chest tube placement on 7/13, reportedly found to have a pleural pulsatile mass. CT scan showed consolidation or mass along the pleural surface of the anterior left upper lobe, outpatient follow-up at [facility] was recommended  -Admitted to [facility], ICU with respiratory failure, aspiration pneumonia required mechanical ventilation. -CT on admission showed persistent large left hydropneumothorax -Treated with mechanical ventilation, broad-spectrum antibiotics, also underwent thoracentesis -Extubated 8/19 transferred to [facility] service 8/21 -Palliative consulted for Goals of care  Assessment & Plan:   Acute hypoxic respiratory failure -Status post mechanical ventilation, extubated on 8/19 -Felt to be secondary to aspiration pneumonia and hypoglycemia -Also has history of chronic left pleural effusion with loculation, status post VATS in July, concern for pulsatile pleural-based mass versus consolidation, outpatient tertiary center follow-up recommended then -Day 7 of IV Zosyn now, will continue this to complete 10-day course, given aspiration pneumonia with parapneumonic effusion -Blood cultures negative, respiratory culture with some yeast, he grew coagulase-negative staph in 1 out of 4 blood cultures with which is consistent with contaminant -Remains at high risk for aspiration  Recurrent left pleural effusion -history of chronic left pleural effusion with loculation, status post VATS in July, concern for pulsatile pleural-based mass versus consolidation, outpatient  tertiary center follow-up recommended then -Had thoracentesis by IR done on 8/16, 400 cc of yellow fluid was drained, this was exudative by LDH, suspected to be a parapneumonic effusion, per Dr.Yacoub, they were not concerned about empyema, no further interventions felt to be indicated, left lower lung was felt to be likely trapped, cultures from pleural fluid are negative  Severe dysphagia -Felt to have severe oropharyngeal dysphagia per SLP evaluation yesterday -Now n.p.o., continue tube feeds via NG tube -SLP continues to follow -Continues to be high risk of aspiration, difficulty managing secretions -Palliative consult for goals of care, plan for more palliative care conversations tomorrow in person, discussed with wife, they would likely not want a PEG tube -Now DNR per PCCM  ESRD on hemodialysis -Per renal -Graft issues  Choreoathetosis of trunk, head and orofacial dyskinesia -Seen by neurology, started on Haldol yesterday, transitioned to Zyprexa today -Patient was taking bentyl 2-3times and gabapentin times prior to admission, I wonder if this withdrawal was contributing -Will restart bentyl and low-dose gabapentin  Advanced multiple sclerosis -SNF resident, almost total care -Resume gabapentin and Bentyl  Hypertension -Continue carvedilol, clonidine, hydralazine, lisinopril  History of CVA, left hemiplegia -Continue aspirin  Severe protein calorie malnutrition -Continue tube feeds via NG tubeAnemia of chronic disease -Stable  Hypertension -Continue carvedilol, clonidine, hydralazine, lisinopril  History of CVA, left hemiplegia -Continue aspirin  Severe protein calorie malnutrition -Continue tube feeds via NG tube  History of gastroparesis  Right sacral pressure ulcer -Present on admission, continue wound care  Type 2 diabetes mellitus -CBG stable, continue sliding scale insulin  DVT prophylaxis: Lovenox Code Status: DNR Family Communication: No family at  bedside, called and updated wife [name] Jaci Standard Disposition Plan: Transfer out of ICU  Consultants:   Neurology   Procedures: Thoracentesis 8/16 400 cc of yellow fluid drained  Antimicrobials:    Subjective: -Remains somnolent but arousable, -Currently  undergoing hemodialysis  Objective: Vitals:   02/27/19 1115 02/27/19 1122 02/27/19 1207 02/27/19 1300  BP: 124/84 (!) 128/59  (!) 137/58  Pulse: 85 84  77  Resp: (!) 25 (!) 29  (!) 29  Temp:  98.3 F (36.8 C) 98.6 F (37 C)   TempSrc:  Oral Oral   SpO2: 100% 100%  100%  Weight:  56.9 kg    Height:        Intake/Output Summary (Last 24 hours) at 02/27/2019 1355 Last data filed at 02/27/2019 1300 Gross per 24 hour  Intake 1459.94 ml  Output 1500 ml  Net -40.06 ml   Filed Weights   02/27/19 0342 02/27/19 0730 02/27/19 1122  Weight: 56.8 kg 58 kg 56.9 kg    Examination:  Gen: Chronically ill debilitated African-American male, awake alert oriented to self only, sitting up in bed, no distress HEENT: PERRLA, Neck supple, no JVD Lungs: Decreased breath sounds at both bases CVS: RRR,No Gallops,Rubs or new Murmurs Abd: soft, Non tender, non distended, BS present Extremities: No edema Skin: no new rashes Neuro, somnolent but arousable, left hemiplegia, poor effort Psychiatry: Pleasant, unable to assess, poor insight and judgment    Data Reviewed:   CBC: Recent Labs  Lab 02/20/19 1901  02/22/19 0330 02/23/19 0322 02/24/19 0337 02/24/19 0347 02/25/19 0722 02/27/19 0339  WBC 12.3*   < > 14.1* 12.5*  --  9.9 10.2 9.0  NEUTROABS 11.3*  --   --   --   --   --   --   --   HGB 12.4*   < > 8.2* 7.9* 8.5* 8.0* 8.4* 8.7*  HCT 39.3   < > 27.0* 24.7* 25.0* 25.7* 26.0* 28.4*  MCV 82.2   < > 83.6 80.2  --  81.6 79.5* 82.1  PLT 226   < > 194 189  --  204 253 236   < > = values in this interval not displayed.   Basic Metabolic Panel: Recent Labs  Lab 02/22/19 0330 02/23/19 0322  02/24/19 0347 02/25/19 0504  02/25/19 0722 02/26/19 0413 02/27/19 0339  NA 132* 130*   < > 133* 134* 133* 135 137  K 4.0 3.8   < > 3.5 3.6 3.6 2.9* 3.8  CL 93* 90*  --  94* 93* 94* 96* 98  CO2 27 26  --  27 26 24 26 28   GLUCOSE 178* 128*  --  198* 115* 111* 170* 142*  BUN 26* 49*  --  29* 45* 48* 29* 40*  CREATININE 5.05* 6.20*  --  3.75* 5.44* 5.49* 3.86* 5.27*  CALCIUM 8.7* 8.7*  --  9.1 9.2 9.5 9.1 9.5  MG 2.0 2.1  --  2.2 2.3  --  2.3  --   PHOS 5.0* 4.1  --  2.3* 3.0 3.2 2.9  --    < > = values in this interval not displayed.   GFR: Estimated Creatinine Clearance: 11.7 mL/min (A) (by C-G formula based on SCr of 5.27 mg/dL (H)). Liver Function Tests: Recent Labs  Lab 02/20/19 1901 02/25/19 0722 02/27/19 0339  AST 24  --  16  ALT 11  --  12  ALKPHOS 98  --  80  BILITOT 0.6  --  0.7  PROT 8.0  --  6.2*  ALBUMIN 3.1* 2.0* 2.0*   No results for input(s): LIPASE, AMYLASE in the last 168 hours. No results for input(s): AMMONIA in the last 168 hours. Coagulation Profile: Recent Labs  Lab  02/20/19 1901  INR 1.0   Cardiac Enzymes: No results for input(s): CKTOTAL, CKMB, CKMBINDEX, TROPONINI in the last 168 hours. BNP (last 3 results) No results for input(s): PROBNP in the last 8760 hours. HbA1C: No results for input(s): HGBA1C in the last 72 hours. CBG: Recent Labs  Lab 02/26/19 2054 02/26/19 2327 02/27/19 0333 02/27/19 0807 02/27/19 1210  GLUCAP 164* 153* 124* 137* 181*   Lipid Profile: No results for input(s): CHOL, HDL, LDLCALC, TRIG, CHOLHDL, LDLDIRECT in the last 72 hours. Thyroid Function Tests: No results for input(s): TSH, T4TOTAL, FREET4, T3FREE, THYROIDAB in the last 72 hours. Anemia Panel: No results for input(s): VITAMINB12, FOLATE, FERRITIN, TIBC, IRON, RETICCTPCT in the last 72 hours. Urine analysis: No results found for: COLORURINE, APPEARANCEUR, LABSPEC, PHURINE, GLUCOSEU, HGBUR, BILIRUBINUR, KETONESUR, PROTEINUR, UROBILINOGEN, NITRITE, LEUKOCYTESUR Sepsis Labs:  @[email or web address] (procalcitonin:4,lacticidven:4)  ) Recent Results (from the past 240 hour(s))  SARS Coronavirus 2 [facility] order, Performed in [facility] hospital lab) Nasopharyngeal Nasopharyngeal Swab     Status: None   Collection Time: 02/20/19 11:42 AM   Specimen: Nasopharyngeal Swab  Result Value Ref Range Status   SARS Coronavirus 2 NEGATIVE NEGATIVE Final    Comment: (NOTE) If result is NEGATIVE SARS-CoV-2 target nucleic acids are NOT DETECTED. The SARS-CoV-2 RNA is generally detectable in upper and lower  respiratory specimens during the acute phase of infection. The lowest  concentration of SARS-CoV-2 viral copies this assay can detect is 250  copies / mL. A negative result does not preclude SARS-CoV-2 infection  and should not be used as the sole basis for treatment or other  patient management decisions.  A negative result may occur with  improper specimen collection / handling, submission of specimen other  than nasopharyngeal swab, presence of viral mutation(s) within the  areas targeted by this assay, and inadequate number of viral copies  (<250 copies / mL). A negative result must be combined with clinical  observations, patient history, and epidemiological information. If result is POSITIVE SARS-CoV-2 target nucleic acids are DETECTED. The SARS-CoV-2 RNA is generally detectable in upper and lower  respiratory specimens dur ing the acute phase of infection.  Positive  results are indicative of active infection with SARS-CoV-2.  Clinical  correlation with patient history and other diagnostic information is  necessary to determine patient infection status.  Positive results do  not rule out bacterial infection or co-infection with other viruses. If result is PRESUMPTIVE POSTIVE SARS-CoV-2 nucleic acids MAY BE PRESENT.   A presumptive positive result was obtained on the submitted specimen  and confirmed on repeat testing.  While 2019 novel coronavirus  (SARS-CoV-2)  nucleic acids may be present in the submitted sample  additional confirmatory testing may be necessary for epidemiological  and / or clinical management purposes  to differentiate between  SARS-CoV-2 and other Sarbecovirus currently known to infect humans.  If clinically indicated additional testing with an alternate test  methodology [phone]) is advised. The SARS-CoV-2 RNA is generally  detectable in upper and lower respiratory sp ecimens during the acute  phase of infection. The expected result is Negative. Fact Sheet for Patients:  StrictlyIdeas.no Fact Sheet for Healthcare Providers: BankingDealers.co.za This test is not yet approved or cleared by the [place] FDA and has been authorized for detection and/or diagnosis of SARS-CoV-2 by FDA under an Emergency Use Authorization (EUA).  This EUA will remain in effect (meaning this test can be used) for the duration of the COVID-19 declaration under Section 564(b)(1) of the Act, 21  U.S.C. section 360bbb-3(b)(1), unless the authorization is terminated or revoked sooner. Performed at [facility], [address], [place], [place] [address]   Culture, blood (Routine X 2) w Reflex to ID Panel     Status: None   Collection Time: 02/20/19  2:24 PM   Specimen: Right Antecubital; Blood  Result Value Ref Range Status   Specimen Description   Final    RIGHT ANTECUBITAL BOTTLES DRAWN AEROBIC AND ANAEROBIC   Special Requests Blood Culture adequate volume  Final   Culture   Final    NO GROWTH 5 DAYS Performed at [facility], [address], [place], [place] [address]    Report Status 02/25/2019 FINAL  Final  MRSA PCR Screening     Status: None   Collection Time: 02/20/19  5:15 PM   Specimen: Nasopharyngeal  Result Value Ref Range Status   MRSA by PCR NEGATIVE NEGATIVE Final    Comment:        The GeneXpert MRSA Assay (FDA approved for NASAL specimens only), is one component of a  comprehensive MRSA colonization surveillance program. It is not intended to diagnose MRSA infection nor to guide or monitor treatment for MRSA infections. Performed at [facility], [place] [address] [place], [place] [address]   Culture, respiratory (tracheal aspirate)     Status: None   Collection Time: 02/20/19  6:11 PM   Specimen: Tracheal Aspirate; Respiratory  Result Value Ref Range Status   Specimen Description TRACHEAL ASPIRATE  Final   Special Requests NONE  Final   Gram Stain   Final    ABUNDANT WBC PRESENT,BOTH PMN AND MONONUCLEAR FEW GRAM VARIABLE ROD MODERATE YEAST Performed at [facility], [place] [address] [place], [place] [address]    Culture FEW CANDIDA GLABRATA  Final   Report Status 02/23/2019 FINAL  Final  Culture, blood (routine x 2)     Status: Abnormal   Collection Time: 02/20/19  6:26 PM   Specimen: BLOOD RIGHT HAND  Result Value Ref Range Status   Specimen Description BLOOD RIGHT HAND  Final   Special Requests   Final    AEROBIC BOTTLE ONLY Blood Culture results may not be optimal due to an inadequate volume of blood received in culture bottles   Culture  Setup Time   Final    GRAM POSITIVE COCCI IN CLUSTERS AEROBIC BOTTLE ONLY CRITICAL RESULT CALLED TO, READ BACK BY AND VERIFIED WITH: PHARMD TYLER BAUMEISTER [id number] FCP    Culture (A)  Final    STAPHYLOCOCCUS SPECIES (COAGULASE NEGATIVE) THE SIGNIFICANCE OF ISOLATING THIS ORGANISM FROM A SINGLE SET OF BLOOD CULTURES WHEN MULTIPLE SETS ARE DRAWN IS UNCERTAIN. PLEASE NOTIFY THE MICROBIOLOGY DEPARTMENT WITHIN ONE WEEK IF SPECIATION AND SENSITIVITIES ARE REQUIRED. Performed at [facility], [place] [address] [place], [place] [address]    Report Status 02/23/2019 FINAL  Final  Blood Culture ID Panel (Reflexed)     Status: Abnormal   Collection Time: 02/20/19  6:26 PM  Result Value Ref Range Status   Enterococcus species NOT DETECTED NOT DETECTED Final   Listeria monocytogenes NOT  DETECTED NOT DETECTED Final   Staphylococcus species DETECTED (A) NOT DETECTED Final    Comment: Methicillin (oxacillin) resistant coagulase negative staphylococcus. Possible blood culture contaminant (unless isolated from more than one blood culture draw or clinical case suggests pathogenicity). No antibiotic treatment is indicated for blood  culture contaminants. CRITICAL RESULT CALLED TO, READ BACK BY AND VERIFIED WITH: PHARMD TYLER BAUMEISTER  [id number] FCP    Staphylococcus aureus (BCID) NOT DETECTED NOT DETECTED Final   Methicillin resistance DETECTED (A) NOT DETECTED Final    Comment: CRITICAL RESULT CALLED TO, READ BACK BY AND VERIFIED WITH: PHARMD TYLER BAUMEISTER [id number] FCP    Streptococcus species NOT DETECTED NOT DETECTED Final   Streptococcus agalactiae NOT DETECTED NOT DETECTED Final   Streptococcus pneumoniae NOT DETECTED NOT DETECTED Final   Streptococcus pyogenes NOT DETECTED NOT DETECTED Final   Acinetobacter baumannii NOT DETECTED NOT DETECTED Final   Enterobacteriaceae species NOT DETECTED NOT DETECTED Final   Enterobacter cloacae complex NOT DETECTED NOT DETECTED Final   Escherichia coli NOT DETECTED NOT DETECTED Final   Klebsiella oxytoca NOT DETECTED NOT DETECTED Final   Klebsiella pneumoniae NOT DETECTED NOT DETECTED Final   Proteus species NOT DETECTED NOT DETECTED Final   Serratia marcescens NOT DETECTED NOT DETECTED Final   Haemophilus influenzae NOT DETECTED NOT DETECTED Final   Neisseria meningitidis NOT DETECTED NOT DETECTED Final   Pseudomonas aeruginosa NOT DETECTED NOT DETECTED Final   Candida albicans NOT DETECTED NOT DETECTED Final   Candida glabrata NOT DETECTED NOT DETECTED Final   Candida krusei NOT DETECTED NOT DETECTED Final   Candida parapsilosis NOT DETECTED NOT DETECTED Final   Candida tropicalis NOT DETECTED NOT DETECTED Final    Comment: Performed at [facility], [place]. [address] [place], [place] [address]  Culture, blood  (routine x 2)     Status: None   Collection Time: 02/20/19  7:01 PM   Specimen: BLOOD RIGHT HAND  Result Value Ref Range Status   Specimen Description BLOOD RIGHT HAND  Final   Special Requests   Final    AEROBIC BOTTLE ONLY Blood Culture results may not be optimal due to an inadequate volume of blood received in culture bottles   Culture   Final    NO GROWTH 5 DAYS Performed at [facility], [place] [address], [place], [place] [address]    Report Status 02/25/2019 FINAL  Final  Gram stain     Status: None   Collection Time: 02/21/19 11:26 AM   Specimen: Pleura  Result Value Ref Range Status   Specimen Description PLEURAL RIGHT  Final   Special Requests NONE  Final   Gram Stain   Final    WBC PRESENT,BOTH PMN AND MONONUCLEAR NO ORGANISMS SEEN CYTOSPIN SMEAR Performed at [facility], [address], [place], [place] [address]    Report Status 02/21/2019 FINAL  Final  Culture, body fluid-bottle     Status: None   Collection Time: 02/21/19 11:26 AM   Specimen: Pleura  Result Value Ref Range Status   Specimen Description PLEURAL RIGHT  Final   Special Requests NONE  Final   Culture   Final    NO GROWTH 5 DAYS Performed at [place] [address], [place], [place] [address]    Report Status 02/26/2019 FINAL  Final         Radiology Studies: Dg Chest Port 1 View  Result Date: 02/26/2019 CLINICAL DATA:  Respiratory failure. EXAM: PORTABLE CHEST 1 VIEW COMPARISON:  One-view chest x-ray 02/25/2019 FINDINGS: Heart size is normal. Left-sided pneumothorax is stable. Interstitial and airspace disease in the right lung has improved. Right IJ line is stable. NG tube courses off the inferior border the film. Residual contrast is noted in the stomach. IMPRESSION: 1. Stable left-sided pneumothorax. 2. Improving aeration of the right lung. Electronically Signed  By: San Morelle M.D.   On: 02/26/2019 07:40   Dg Abd Portable 1v  Result Date: 02/25/2019  CLINICAL DATA:  NG tube placement EXAM: PORTABLE ABDOMEN - 1 VIEW COMPARISON:  February 24, 2019 FINDINGS: The NG tube projects over the gastric antrum/pylorus. The tip is pointed distally. There is some oral contrast within the stomach. The bowel gas pattern is nonspecific with gaseous distention of loops of small bowel and colon scattered throughout the abdomen. There is some oral contrast in the left lower quadrant. IMPRESSION: Enteric tube projects over the gastric antrum/pylorus with the tip pointed distally. Electronically Signed   By: Constance Holster M.D.   On: 02/25/2019 17:55        Scheduled Meds: . amLODipine  5 mg Per Tube BID  . aspirin  81 mg Per Tube Daily  . carvedilol  25 mg Per Tube BID WC  . chlorhexidine  15 mL Mouth Rinse BID  . Chlorhexidine Gluconate Cloth  6 each Topical Daily  . Chlorhexidine Gluconate Cloth  6 each Topical Q0600  . Chlorhexidine Gluconate Cloth  6 each Topical Q0600  . cloNIDine  0.2 mg Per Tube TID  . [START ON 03/02/2019] darbepoetin (ARANESP) injection - DIALYSIS  100 mcg Intravenous Q Tue-HD  . dicyclomine  10 mg Per Tube BID  . docusate  100 mg Per Tube Daily  . enoxaparin (LOVENOX) injection  30 mg Subcutaneous Q24H  . feeding supplement (PRO-STAT SUGAR FREE 64)  30 mL Per Tube BID  . gabapentin  100 mg Per Tube QHS  . hydrALAZINE  100 mg Per Tube Q8H  . insulin aspart  1-3 Units Subcutaneous Q4H  . lisinopril  20 mg Per Tube Daily  . mouth rinse  15 mL Mouth Rinse q12n4p  . multivitamin  1 tablet Oral QHS  . OLANZapine  5 mg Per Tube QHS  . pantoprazole sodium  40 mg Per Tube Daily   Continuous Infusions: . sodium chloride    . sodium chloride    . sodium chloride    . sodium chloride    . feeding supplement (VITAL 1.5 CAL) 45 mL/hr at 02/27/19 1300  . piperacillin-tazobactam (ZOSYN)  IV       LOS: 7 days    Time spent: 51min    Domenic Polite, MD [facility] Page via [email or web address], password [email or web address] After 7PM  please contact night-coverage  02/27/2019, 1:55 PM

## 2019-02-27 NOTE — Progress Notes (Signed)
Assisted tele visit to patient with family member.  Shena Vinluan Delsin, RN   

## 2019-02-27 NOTE — Progress Notes (Signed)
Assisted tele visit to patient with family member.  Reba Hulett M, RN  

## 2019-02-27 NOTE — Plan of Care (Signed)
  Problem: Clinical Measurements: Goal: Ability to maintain clinical measurements within normal limits will improve Outcome: Progressing Goal: Will remain free from infection Outcome: Progressing Goal: Diagnostic test results will improve Outcome: Progressing Goal: Respiratory complications will improve Outcome: Progressing Goal: Cardiovascular complication will be avoided Outcome: Progressing   Problem: Nutrition: Goal: Adequate nutrition will be maintained Outcome: Progressing Note: Tube feedings, failed barium swallow, SLP following   Problem: Coping: Goal: Level of anxiety will decrease Outcome: Progressing   Problem: Pain Managment: Goal: General experience of comfort will improve Outcome: Progressing Note: Denies pain    Problem: Safety: Goal: Ability to remain free from injury will improve Outcome: Progressing   Problem: Activity: Goal: Ability to tolerate increased activity will improve Outcome: Progressing   Problem: Respiratory: Goal: Ability to maintain a clear airway and adequate ventilation will improve Outcome: Progressing   Problem: Role Relationship: Goal: Method of communication will improve Outcome: Progressing

## 2019-02-27 NOTE — Progress Notes (Addendum)
[facility]    NEPHROLOGY PROGRESS NOTE  SUBJECTIVE:  Last had HD on 8/20 with 1.4 kg UF.  No acute events overnight.   Review of systems:   Not taking PO  Denies overt shortness of breath  Denies n/v  OBJECTIVE:  Vitals:   02/27/19 0500 02/27/19 0504  BP: (!) 133/55 (!) 133/55  Pulse: 71   Resp: (!) 22   Temp:    SpO2: 100%     Intake/Output Summary (Last 24 hours) at 02/27/2019 0538 Last data filed at 02/27/2019 0500 Gross per 24 hour  Intake 1609.52 ml  Output -  Net 1609.52 ml      General: adult male in bed  HEENT: MMM [place] AT anicteric sclera CV:  RRR no rub  Lungs:  coarse breath sounds bilaterally Abd:  abd SNT/ND  Extremities: no lower extremity edema  Neuro: awake; oriented to self  Access: left upper extremity aneurysmal AV graft with thrill and bruit   MEDICATIONS:  . amLODipine  5 mg Per Tube BID  . aspirin  81 mg Per Tube Daily  . carvedilol  25 mg Per Tube BID WC  . chlorhexidine  15 mL Mouth Rinse BID  . Chlorhexidine Gluconate Cloth  6 each Topical Daily  . Chlorhexidine Gluconate Cloth  6 each Topical Q0600  . Chlorhexidine Gluconate Cloth  6 each Topical Q0600  . cloNIDine  0.2 mg Per Tube TID  . [START ON 03/02/2019] darbepoetin (ARANESP) injection - DIALYSIS  100 mcg Intravenous Q Tue-HD  . dicyclomine  10 mg Per Tube BID  . docusate  100 mg Per Tube Daily  . enoxaparin (LOVENOX) injection  30 mg Subcutaneous Q24H  . feeding supplement (PRO-STAT SUGAR FREE 64)  30 mL Per Tube BID  . gabapentin  100 mg Per Tube QHS  . hydrALAZINE  100 mg Per Tube Q8H  . insulin aspart  1-3 Units Subcutaneous Q4H  . lisinopril  20 mg Per Tube Daily  . mouth rinse  15 mL Mouth Rinse q12n4p  . multivitamin  1 tablet Oral QHS  . OLANZapine  5 mg Per Tube QHS  . pantoprazole sodium  40 mg Per Tube Daily      LABS:  CBC Latest Ref Rng & Units 02/27/2019 02/25/2019 02/24/2019  WBC 4.0 - 10.5 K/uL 9.0 10.2 9.9  Hemoglobin 13.0 - 17.0 g/dL  8.7(L) 8.4(L) 8.0(L)  Hematocrit 39.0 - 52.0 % 28.4(L) 26.0(L) 25.7(L)  Platelets 150 - 400 K/uL 236 253 204    CMP Latest Ref Rng & Units 02/27/2019 02/26/2019 02/25/2019  Glucose 70 - 99 mg/dL 142(H) 170(H) 111(H)  BUN 8 - 23 mg/dL 40(H) 29(H) 48(H)  Creatinine 0.61 - 1.24 mg/dL 5.27(H) 3.86(H) 5.49(H)  Sodium 135 - 145 mmol/L 137 135 133(L)  Potassium 3.5 - 5.1 mmol/L 3.8 2.9(L) 3.6  Chloride 98 - 111 mmol/L 98 96(L) 94(L)  CO2 22 - 32 mmol/L 28 26 24   Calcium 8.9 - 10.3 mg/dL 9.5 9.1 9.5  Total Protein 6.5 - 8.1 g/dL 6.2(L) - -  Total Bilirubin 0.3 - 1.2 mg/dL 0.7 - -  Alkaline Phos 38 - 126 U/L 80 - -  AST 15 - 41 U/L 16 - -  ALT 0 - 44 U/L 12 - -    Lab Results  Component Value Date   CALCIUM 9.5 02/27/2019   CAION 1.23 02/24/2019   PHOS 2.9 02/26/2019    No results found for: COLORURINE, APPEARANCEUR, [place], [place], GLUCOSEU, [place], BILIRUBINUR,  [place]errill Memo, UROBILINOGEN, NITRITE, LEUKOCYTESUR    Component Value Date/Time   PHART 7.513 (H) 02/24/2019 0337   PCO2ART 35.9 02/24/2019 0337   PO2ART 79.0 (L) 02/24/2019 0337   HCO3 28.8 (H) 02/24/2019 0337   TCO2 30 02/24/2019 0337   O2SAT 97.0 02/24/2019 0337       Component Value Date/Time   IRON 75 02/03/2019 1117   TIBC 169 (L) 02/03/2019 1117   FERRITIN 1,429 (H) 02/03/2019 1117   IRONPCTSAT 44 (H) 02/03/2019 1117       ASSESSMENT/PLAN:     1.  End-stage renal disease on hemodialysis.  - Continue HD per TTS schedule; HD today  - Will need vascular to assess AV graft   2.  Acute hypoxemic respiratory failure.  Likely secondary to pneumonitis.  History of left pleural effusion status post left VATS in July 2020 with loculated basilar left hydro-with pneumothorax.  S/p Thoracentesis. abx per primary team.  Extubated    3.  HTN. Continue carvedilol. Continue clonidine 0.2 mg TID (compared with home 0.3 mg BID).  Continue hydralazine 100 mg TID.  Increased amlodipine back to 5 mg BID per reported home  dosing.  Continue increased dose of lisinopril at 20 mg daily   4.  Acute metabolic encephalopathy secondary to hypoglycemia.  Team is monitoring blood sugars closely.  mental status improving.   5.  Anemia of chronic kidney disease.   Monitor serial hemoglobins.  Increased ESA to 100 mcg for next dose.  Trend.  Is iron replete.   6. hx Hyperphosphatemia.  acceptable off of phoslo for now.  (off with mild hypophos).  Follow.    7.  Hypokalemia - 3K bath ordered  Claudia Desanctis 02/27/2019 5:38 AM

## 2019-02-28 DIAGNOSIS — J189 Pneumonia, unspecified organism: Secondary | ICD-10-CM

## 2019-02-28 DIAGNOSIS — Z515 Encounter for palliative care: Secondary | ICD-10-CM

## 2019-02-28 LAB — BASIC METABOLIC PANEL
Anion gap: 10 (ref 5–15)
BUN: 28 mg/dL — ABNORMAL HIGH (ref 8–23)
CO2: 28 mmol/L (ref 22–32)
Calcium: 9.1 mg/dL (ref 8.9–10.3)
Chloride: 98 mmol/L (ref 98–111)
Creatinine, Ser: 3.75 mg/dL — ABNORMAL HIGH (ref 0.61–1.24)
GFR calc Af Amer: 19 mL/min — ABNORMAL LOW (ref >60)
GFR calc non Af Amer: 16 mL/min — ABNORMAL LOW (ref >60)
Glucose, Bld: 172 mg/dL — ABNORMAL HIGH (ref 70–99)
Potassium: 4.2 mmol/L (ref 3.5–5.1)
Sodium: 136 mmol/L (ref 135–145)

## 2019-02-28 LAB — GLUCOSE, CAPILLARY
Glucose-Capillary: 129 mg/dL — ABNORMAL HIGH (ref 70–99)
Glucose-Capillary: 141 mg/dL — ABNORMAL HIGH (ref 70–99)
Glucose-Capillary: 156 mg/dL — ABNORMAL HIGH (ref 70–99)
Glucose-Capillary: 166 mg/dL — ABNORMAL HIGH (ref 70–99)
Glucose-Capillary: 199 mg/dL — ABNORMAL HIGH (ref 70–99)

## 2019-02-28 MED ORDER — GLYCOPYRROLATE 1 MG PO TABS: 1.0000 mg | ORAL_TABLET | ORAL | Status: DC | PRN

## 2019-02-28 MED ORDER — PIPERACILLIN-TAZOBACTAM 3.375 G IVPB
3.3750 g | Freq: Two times a day (BID) | INTRAVENOUS | Status: AC
  Administered 2019-02-28 – 2019-03-01 (×2): 3.375 g via INTRAVENOUS
  Filled 2019-02-28 (×2): qty 50

## 2019-02-28 MED ORDER — HALOPERIDOL LACTATE 5 MG/ML IJ SOLN: 0.5000 mg | INTRAMUSCULAR | Status: DC | PRN

## 2019-02-28 MED ORDER — GLYCOPYRROLATE 0.2 MG/ML IJ SOLN: 0.2000 mg | INTRAMUSCULAR | Status: DC | PRN

## 2019-02-28 MED ORDER — OXYCODONE HCL 20 MG/ML PO CONC: 5.0000 mg | ORAL | Status: DC | PRN

## 2019-02-28 MED ORDER — LORAZEPAM 1 MG PO TABS: 1.0000 mg | ORAL_TABLET | ORAL | Status: DC | PRN

## 2019-02-28 MED ORDER — ONDANSETRON 4 MG PO TBDP: 4.0000 mg | ORAL_TABLET | Freq: Four times a day (QID) | ORAL | Status: DC | PRN

## 2019-02-28 MED ORDER — HALOPERIDOL 0.5 MG PO TABS: 0.5000 mg | ORAL_TABLET | ORAL | Status: DC | PRN

## 2019-02-28 MED ORDER — LORAZEPAM 2 MG/ML PO CONC: 0.5000 mg | Freq: Three times a day (TID) | ORAL | Status: DC | PRN

## 2019-02-28 MED ORDER — POLYVINYL ALCOHOL 1.4 % OP SOLN
1.0000 [drp] | Freq: Four times a day (QID) | OPHTHALMIC | Status: DC | PRN
  Filled 2019-02-28: qty 15

## 2019-02-28 MED ORDER — BIOTENE DRY MOUTH MT LIQD: 15.0000 mL | OROMUCOSAL | Status: DC | PRN

## 2019-02-28 MED ORDER — CHLORHEXIDINE GLUCONATE CLOTH 2 % EX PADS
6.0000 | MEDICATED_PAD | Freq: Every day | CUTANEOUS | Status: DC
  Administered 2019-03-01: 6 via TOPICAL

## 2019-02-28 MED ORDER — ONDANSETRON HCL 4 MG/2ML IJ SOLN: 4.0000 mg | Freq: Four times a day (QID) | INTRAMUSCULAR | Status: DC | PRN

## 2019-02-28 MED ORDER — LORAZEPAM 2 MG/ML IJ SOLN: 1.0000 mg | INTRAMUSCULAR | Status: DC | PRN

## 2019-02-28 MED ORDER — HALOPERIDOL LACTATE 2 MG/ML PO CONC
0.5000 mg | ORAL | Status: DC | PRN
  Filled 2019-02-28: qty 0.3

## 2019-02-28 NOTE — Care Management (Addendum)
Received notification from Manus Gunning, Palliative Services, that patient will DC with home hospice care, and wife would like to use [facility].  Attempt to call wife, no answer and "VM full" on both numbers below. Mohs,[name] Spouse [id number]  [place] [phone]  Wife called back and confirmed that she wants [facility].  She states that patient has suction, oxygen ([employer]), hospital bed, RW WC at home.  She confirms he will be going to [address] [place]. He will need transport home, distance is 52 miles.  She confirms that she will be providing 24/7 care. She states that she has been in hospital several times and she is aware of the amount of care he will require and that she is capable of providing it. She states that he has PCS through [employer] at 6 hours a day and that she is getting it increased to 8 hours a day.   She confirms that they plan to stop HD, and that he is not eating.   LM with [facility] Referral line. Awaiting callback.  16:25 Routed H&P, notes, demographics to Rogelia Rohrer at [facility] at [phone] She will assess and speak to CM tomorrow to finalize plans for DC.

## 2019-02-28 NOTE — Progress Notes (Signed)
Daily Progress Note   Patient Name: Cole Nelson       Date: 02/28/2019 DOB: October 28, 1955  Age: 63 y.o. MRN#: [id number] Attending Physician: Cole Polite, MD Primary Care Physician: Cole Renshaw, MD Admit Date: 02/20/2019  Reason for Consultation/Follow-up: Establishing goals of care, Hospice Evaluation and Psychosocial/spiritual support  Subjective: Examined patient, discussed with Cole Nelson bedside RN.  Cole Nelson and I met with Cole Nelson (Wife) and added Cole Nelson (Dtr) on by conference call.    Cole Nelson was visibly exhausted and shaken.  She started our meeting by asking "How do we get him home?".  We discussed the two pertinent issues - (1) nutrition and PO intake, and (2) hemodialysis.  After an extensive discussion including comfort feeds, aspiration pneumonia, aspiration precautions, natural dehydration, death - the family reconfirmed that they do not want a PEG tube.  They plan to carefully hand feed him understanding that he will aspirate.  His medications came up as a concern as he is not taking POs.  We agreed to attempt to convert as many medications as possible to transdermal, sublingual or crushable form.  Then we discussed hemodialysis.  The family considered doing 1 or 2 more treatments - but after some discussion they opted to get him home as soon as possible with no further hemodialysis.  A prognosis of days to approximately 2 weeks was given.    We talked about Hospice care.  Cole Nelson wants Cole Nelson at home.  She is experienced in caring for the dying and convinced that it is the right thing to do - to take him home.  She called an RN friend for advice about which Hospice to pick and requested [facility].   Assessment: 63 yo male with end stage MS, ESRD, gastroparesis,  recurrent aspiration, who is bed bound and severely deconditioned.  More lethargic today than yesterday.   Patient Profile/HPI:  63 y.o. male "Cole Nelson " with past medical history of ESRD on HD, advanced multiple sclerosis, DM with gastroparesis, CVA with left sided hemiparesis (9047), diastolic dysfunction who was admitted on 02/20/2019 with respiratory distress secondary to aspiration pneumonia.  He had a CBG of 31.  He required intubation. He is now extubated but continues to have dysphagia and altered mental status.  On 8/16 a parapneumonic effusion was drained and found to be exudative.  He  is currently receiving tube feeds.  His swallow evaluation concerning for aspiration.  Of note he was hospitalized from 7/23 - 8/12 with vomiting from gastroparesis.  On 7/13 he underwent VATS for a left pleural effusion.  He now has a loculated left hydropneumothorax with a possible pleural mass.   Length of Stay: 8  Current Medications: Scheduled Meds:  . amLODipine  5 mg Per Tube BID  . carvedilol  25 mg Per Tube BID WC  . chlorhexidine  15 mL Mouth Rinse BID  . [START ON 03/01/2019] Chlorhexidine Gluconate Cloth  6 each Topical Q0600  . cloNIDine  0.2 mg Per Tube TID  . [START ON 03/02/2019] darbepoetin (ARANESP) injection - DIALYSIS  100 mcg Intravenous Q Tue-HD  . dicyclomine  10 mg Per Tube BID  . docusate  100 mg Per Tube Daily  . gabapentin  100 mg Per Tube QHS  . hydrALAZINE  100 mg Per Tube Q8H  . lisinopril  20 mg Per Tube Daily  . mouth rinse  15 mL Mouth Rinse q12n4p  . pantoprazole sodium  40 mg Per Tube Daily    Continuous Infusions: . feeding supplement (VITAL 1.5 CAL) 45 mL/hr at 02/28/19 1500  . piperacillin-tazobactam (ZOSYN)  IV      PRN Meds: acetaminophen (TYLENOL) oral liquid 160 mg/5 mL, antiseptic oral rinse, bisacodyl, [DISCONTINUED] glycopyrrolate **OR** glycopyrrolate **OR** glycopyrrolate, [DISCONTINUED] haloperidol **OR** haloperidol **OR** haloperidol lactate,  hydrALAZINE, ipratropium-albuterol, lidocaine (PF), lidocaine-prilocaine, [DISCONTINUED] LORazepam **OR** LORazepam **OR** [DISCONTINUED] LORazepam, ondansetron **OR** ondansetron (ZOFRAN) IV, oxyCODONE, polyvinyl alcohol  Physical Exam       Thin frail gentleman, asleep. Does not wake to my voice.  Cortrak in place.  NAD  Vital Signs: BP (!) 139/52   Pulse (!) 58   Temp 98 F (36.7 C) (Oral)   Resp 17   Ht _0  (1.803 m)   Wt 57.1 kg   SpO2 100%   BMI 17.56 kg/m  SpO2: SpO2: 100 % O2 Device: O2 Device: Nasal Cannula O2 Flow Rate: O2 Flow Rate (L/min): 2 L/min  Intake/output summary:   Intake/Output Summary (Last 24 hours) at 02/28/2019 1614 Last data filed at 02/28/2019 1500 Gross per 24 hour  Intake 1073.38 ml  Output -  Net 1073.38 ml   LBM: Last BM Date: 02/28/19 Baseline Weight: Weight: 61.2 kg Most recent weight: Weight: 57.1 kg       Palliative Assessment/Data: 10%      Patient Active Problem List   Diagnosis Date Noted  . Aspiration pneumonia of right lung ([place])   . ESRD on dialysis ([place])   . Multiple sclerosis ([place])   . Palliative care encounter   . Respiratory failure with hypoxia ([place]) 02/20/2019  . AMS (altered mental status) 02/20/2019  . Nausea and vomiting   . Acute gastric erosion   . Abnormal CT scan, small bowel   . Protein-calorie malnutrition, severe 01/29/2019  . Acute encephalopathy 01/28/2019  . Pressure injury of skin 01/28/2019  . Pleural effusion   . End-stage renal disease on hemodialysis [facility])     Palliative Care Plan    Recommendations/Plan:  Shift goal of care to comfort.  Home with hospice as soon as hospice is arranged.  (tomorrow?)  Recommend he d/c home with concentrated roxicodone solution and ativan solution for comfort.  Will request help from attending physician to convert necessary medications to TD or solution (clonidine patch, gabapentin solution, etc) as the cor trak will be discontinued.  Goals of Care  and Additional Recommendations:  Limitations on Scope of Treatment: Full Comfort Care  Code Status:  DNR  Prognosis:   < 2 weeks   Discharge Planning:  Home with Hospice  Care plan was discussed with Case management, attending physician, ICU RN, family.  Thank you for allowing the Palliative Medicine Team to assist in the care of this patient.  Total time spent:  75 min.     Greater than 50%  of this time was spent counseling and coordinating care related to the above assessment and plan.  Florentina Jenny, PA-C Palliative Medicine  Please contact Palliative MedicineTeam phone at [phone] for questions and concerns between 7 am - 7 pm.   Please see AMION for individual provider pager numbers.

## 2019-02-28 NOTE — Progress Notes (Addendum)
[facility]    NEPHROLOGY PROGRESS NOTE  SUBJECTIVE:  Still waiting on a progressive care bed per nursing.  Had HD on 8/22 with 1.5 kg UF.  Hypertensive overnight - better yesterday.  States he slept ok.  Palliative saw him yesterday and his wife is coming in today to speak with team re: goals.   Review of systems: Still taking PO - on feeds Denies overt shortness of breath  Denies n/v  OBJECTIVE:  Vitals:   02/28/19 0500 02/28/19 0503  BP: (!) 193/78 (!) 193/78  Pulse: 85   Resp: 18   Temp:    SpO2: 100%     Intake/Output Summary (Last 24 hours) at 02/28/2019 0534 Last data filed at 02/28/2019 0400 Gross per 24 hour  Intake 1304.92 ml  Output 1500 ml  Net -195.08 ml      General: adult male in bed  HEENT: MMM [place] AT anicteric sclera CV:  RRR no rub  Lungs:  has coarse breath sounds bilaterally; 2 liters oxygen Abd:  abd SNT/ND  Extremities: no lower extremity edema  Neuro: awake; oriented to self  Access: left upper extremity aneurysmal AV graft with thrill and bruit   MEDICATIONS:  . amLODipine  5 mg Per Tube BID  . aspirin  81 mg Per Tube Daily  . carvedilol  25 mg Per Tube BID WC  . chlorhexidine  15 mL Mouth Rinse BID  . [START ON 03/01/2019] Chlorhexidine Gluconate Cloth  6 each Topical Q0600  . cloNIDine  0.2 mg Per Tube TID  . [START ON 03/02/2019] darbepoetin (ARANESP) injection - DIALYSIS  100 mcg Intravenous Q Tue-HD  . dicyclomine  10 mg Per Tube BID  . docusate  100 mg Per Tube Daily  . enoxaparin (LOVENOX) injection  30 mg Subcutaneous Q24H  . feeding supplement (PRO-STAT SUGAR FREE 64)  30 mL Per Tube BID  . gabapentin  100 mg Per Tube QHS  . hydrALAZINE  100 mg Per Tube Q8H  . insulin aspart  1-3 Units Subcutaneous Q4H  . lisinopril  20 mg Per Tube Daily  . mouth rinse  15 mL Mouth Rinse q12n4p  . multivitamin  1 tablet Oral QHS  . OLANZapine  5 mg Per Tube QHS  . pantoprazole sodium  40 mg Per Tube Daily      LABS:  CBC  Latest Ref Rng & Units 02/27/2019 02/25/2019 02/24/2019  WBC 4.0 - 10.5 K/uL 9.0 10.2 9.9  Hemoglobin 13.0 - 17.0 g/dL 8.7(L) 8.4(L) 8.0(L)  Hematocrit 39.0 - 52.0 % 28.4(L) 26.0(L) 25.7(L)  Platelets 150 - 400 K/uL 236 253 204    CMP Latest Ref Rng & Units 02/28/2019 02/27/2019 02/26/2019  Glucose 70 - 99 mg/dL 172(H) 142(H) 170(H)  BUN 8 - 23 mg/dL 28(H) 40(H) 29(H)  Creatinine 0.61 - 1.24 mg/dL 3.75(H) 5.27(H) 3.86(H)  Sodium 135 - 145 mmol/L 136 137 135  Potassium 3.5 - 5.1 mmol/L 4.2 3.8 2.9(L)  Chloride 98 - 111 mmol/L 98 98 96(L)  CO2 22 - 32 mmol/L 28 28 26   Calcium 8.9 - 10.3 mg/dL 9.1 9.5 9.1  Total Protein 6.5 - 8.1 g/dL - 6.2(L) -  Total Bilirubin 0.3 - 1.2 mg/dL - 0.7 -  Alkaline Phos 38 - 126 U/L - 80 -  AST 15 - 41 U/L - 16 -  ALT 0 - 44 U/L - 12 -    Lab Results  Component Value Date   CALCIUM 9.1 02/28/2019   CAION 1.23  02/24/2019   PHOS 2.9 02/26/2019    No results found for: COLORURINE, APPEARANCEUR, LABSPEC, PHURINE, GLUCOSEU, HGBUR, BILIRUBINUR, KETONESUR, PROTEINUR, UROBILINOGEN, NITRITE, LEUKOCYTESUR    Component Value Date/Time   PHART 7.513 (H) 02/24/2019 0337   PCO2ART 35.9 02/24/2019 0337   PO2ART 79.0 (L) 02/24/2019 0337   HCO3 28.8 (H) 02/24/2019 0337   TCO2 30 02/24/2019 0337   O2SAT 97.0 02/24/2019 0337       Component Value Date/Time   IRON 75 02/03/2019 1117   TIBC 169 (L) 02/03/2019 1117   FERRITIN 1,429 (H) 02/03/2019 1117   IRONPCTSAT 44 (H) 02/03/2019 1117       ASSESSMENT/PLAN:     1.  End-stage renal disease on hemodialysis.  - Continue HD per TTS schedule - Defer vascular access eval - goals of care discussions ongoing   2.  Acute hypoxemic respiratory failure.  Likely secondary to pneumonitis.  History of left pleural effusion status post left VATS in July 2020 with loculated basilar left hydro-with pneumothorax.  S/p Thoracentesis. abx per primary team.  Extubated    3.  HTN. Continue carvedilol. Continue clonidine 0.2 mg TID  (compared with home 0.3 mg BID).  Continue hydralazine 100 mg TID.  Increased amlodipine back to 5 mg BID per reported home dosing.  Continue increased dose of lisinopril at 20 mg daily.  Has hydralazine PRN as well   4.  Acute metabolic encephalopathy secondary in part to hypoglycemia.  Team is monitoring blood sugars.  5.  Anemia of chronic kidney disease.   Monitor serial hemoglobins.  Increased ESA to 100 mcg for next dose.  Trend.  Is iron replete.   6. hx Hyperphosphatemia.  acceptable off of phoslo for now.  (off with mild hypophos).  Follow.  Repeat renal panel in AM   7.  Hypokalemia - 3K bath ordered  Claudia Desanctis 02/28/2019 5:34 AM

## 2019-02-28 NOTE — Progress Notes (Signed)
Assisted tele visit to patient with family member.  Jaciel Diem M, RN  

## 2019-02-28 NOTE — Progress Notes (Signed)
PROGRESS NOTE    Cole Nelson  [id number] DOB: Dec 03, 1955 DOA: 02/20/2019 PCP: Caprice Renshaw, MD  Brief Narrative: This is a chronically ill 63 year old African-American male from SNF with ESRD on hemodialysis, advanced multiple sclerosis, type 2 diabetes mellitus, history of CVA/left hemiplegia, gastroparesis, history of left pleural effusion status post VATS in 01/18/19, chronic left pleural effusion. -He underwent VATS, chest tube placement on 7/13, reportedly found to have a pleural pulsatile mass. CT scan showed consolidation or mass along the pleural surface of the anterior left upper lobe, outpatient follow-up at [place] [facility] was recommended  -Admitted to [facility], ICU with respiratory failure, aspiration pneumonia required mechanical ventilation. -CT on admission showed persistent large left hydropneumothorax -Treated with mechanical ventilation, broad-spectrum antibiotics, also underwent thoracentesis -Extubated 8/19 transferred to [facility] service 8/21 -Palliative consulted for Goals of care  Assessment & Plan:   Acute hypoxic respiratory failure -Status post mechanical ventilation, extubated on 8/19 -Felt to be secondary to aspiration pneumonia and hypoglycemia -Also has history of chronic left pleural effusion with loculation, status post VATS in July, concern for pulsatile pleural-based mass versus consolidation, outpatient tertiary center follow-up recommended then -Day 9 IV Zosyn today, discontinue antibiotics after 10 days to complete course for aspiration pneumonia/parapneumonic effusion -Blood cultures negative, respiratory cultures with some yeast, pleural fluid cultures negative, -Continues to remain high risk for aspiration -Transfer out of ICU  Recurrent left pleural effusion -history of chronic left pleural effusion with loculation, status post VATS in July, concern for pulsatile pleural-based mass versus consolidation, outpatient tertiary center follow-up  recommended then -Had thoracentesis by IR done on 8/16, 400 cc of yellow fluid was drained, this was exudative by LDH, suspected to be a parapneumonic effusion, per Dr.Yacoub, they were not concerned about empyema, no further interventions felt to be indicated, left lower lung was felt to be likely trapped, cultures from pleural fluid are negative -will complete 10day course of Zosyn tomorrow  Severe dysphagia -SLP following, noted to have severe oropharyngeal dysphasia -Now n.p.o., continue tube feeds via NG tube -Continues to be high risk of aspiration, difficulty managing secretions -Palliative medicine consulted for goals of care, appreciate assistance, I called and discussed with wife as well yesterday, she plans to come by in person  -No plans for PEG tube, continuing dialysis in this debilitated state will be extremely challenging -Now DNR   ESRD on hemodialysis -Per renal, wife having very difficult time deciding about dialysis -Graft issues  Choreoathetosis of trunk, head and orofacial dyskinesia -Seen by neurology, started on Haldol and then Zyprexa, these movements have resolved -Patient was restarted on his home regimen of Bentyl and gabapentin -Zyprexa discontinued today due to oversedation, discussed with Dr. Lorraine Lax  Advanced multiple sclerosis -SNF resident, almost total care -Resumed gabapentin and Bentyl at a lower dose  Hypertension -Continue carvedilol, clonidine, hydralazine, lisinopril  History of CVA, left hemiplegia -Continue aspirin  Severe protein calorie malnutrition -Continue tube feeds via NG tubeAnemia of chronic disease -Stable  Hypertension -Continue carvedilol, clonidine, hydralazine, lisinopril  History of CVA, left hemiplegia -Continue aspirin  Severe protein calorie malnutrition -Continue tube feeds via NG tube  History of gastroparesis  Right sacral pressure ulcer -Present on admission, continue wound care  Type 2 diabetes mellitus  -CBG stable, continue sliding scale insulin  DVT prophylaxis: Lovenox Code Status: DNR Family Communication: No family at bedside, called and updated wife Cole Nelson 8/22 Disposition Plan: Awaiting transfer to progressive care  Consultants:   Neurology   Procedures: Thoracentesis  8/16 400 cc of yellow fluid drained  Antimicrobials:    Subjective: -More somnolent today, opens eyes occasionally, not as interactive as yesterday  Objective: Vitals:   02/28/19 1157 02/28/19 1200 02/28/19 1300 02/28/19 1353  BP:  (!) 159/74 (!) 147/57 (!) 141/64  Pulse:  72 67   Resp:  18 19   Temp: 98 F (36.7 C)     TempSrc: Oral     SpO2:  100% 99%   Weight:      Height:        Intake/Output Summary (Last 24 hours) at 02/28/2019 1400 Last data filed at 02/28/2019 1300 Gross per 24 hour  Intake 1078.72 ml  Output -  Net 1078.72 ml   Filed Weights   02/27/19 0730 02/27/19 1122 02/28/19 0423  Weight: 58 kg 56.9 kg 57.1 kg    Examination:  Gen: Chronically ill debilitated male, somnolent, limited ability to communicate today HEENT: NG tube with tube feeds infusing Lungs: Diminished breath sounds at bases CVS: S1-S2/regular rate rhythm Abd: soft, Non tender, non distended, BS present Extremities: No edema, severe cachexia Skin: no new rashes Neuro, somnolent but arousable, left hemiplegia, poor effort Psychiatry: Pleasant, unable to assess, poor insight and judgment    Data Reviewed:   CBC: Recent Labs  Lab 02/22/19 0330 02/23/19 0322 02/24/19 0337 02/24/19 0347 02/25/19 0722 02/27/19 0339  WBC 14.1* 12.5*  --  9.9 10.2 9.0  HGB 8.2* 7.9* 8.5* 8.0* 8.4* 8.7*  HCT 27.0* 24.7* 25.0* 25.7* 26.0* 28.4*  MCV 83.6 80.2  --  81.6 79.5* 82.1  PLT 194 189  --  204 253 [id number]   Basic Metabolic Panel: Recent Labs  Lab 02/22/19 0330 02/23/19 0322  02/24/19 0347 02/25/19 0504 02/25/19 0722 02/26/19 0413 02/27/19 0339 02/28/19 0341  NA 132* 130*   < > 133* 134* 133*  135 137 136  K 4.0 3.8   < > 3.5 3.6 3.6 2.9* 3.8 4.2  CL 93* 90*  --  94* 93* 94* 96* 98 98  CO2 27 26  --  27 26 24 26 28 28   GLUCOSE 178* 128*  --  198* 115* 111* 170* 142* 172*  BUN 26* 49*  --  29* 45* 48* 29* 40* 28*  CREATININE 5.05* 6.20*  --  3.75* 5.44* 5.49* 3.86* 5.27* 3.75*  CALCIUM 8.7* 8.7*  --  9.1 9.2 9.5 9.1 9.5 9.1  MG 2.0 2.1  --  2.2 2.3  --  2.3  --   --   PHOS 5.0* 4.1  --  2.3* 3.0 3.2 2.9  --   --    < > = values in this interval not displayed.   GFR: Estimated Creatinine Clearance: 16.5 mL/min (A) (by C-G formula based on SCr of 3.75 mg/dL (H)). Liver Function Tests: Recent Labs  Lab 02/25/19 0722 02/27/19 0339  AST  --  16  ALT  --  12  ALKPHOS  --  80  BILITOT  --  0.7  PROT  --  6.2*  ALBUMIN 2.0* 2.0*   No results for input(s): LIPASE, AMYLASE in the last 168 hours. No results for input(s): AMMONIA in the last 168 hours. Coagulation Profile: No results for input(s): INR, PROTIME in the last 168 hours. Cardiac Enzymes: No results for input(s): CKTOTAL, CKMB, CKMBINDEX, TROPONINI in the last 168 hours. BNP (last 3 results) No results for input(s): PROBNP in the last 8760 hours. HbA1C: No results for input(s): HGBA1C in the last 72 hours.  CBG: Recent Labs  Lab 02/27/19 2048 02/28/19 0041 02/28/19 0335 02/28/19 0752 02/28/19 1201  GLUCAP 105* 199* 156* 141* 166*   Lipid Profile: No results for input(s): CHOL, HDL, LDLCALC, TRIG, CHOLHDL, LDLDIRECT in the last 72 hours. Thyroid Function Tests: No results for input(s): TSH, T4TOTAL, FREET4, T3FREE, THYROIDAB in the last 72 hours. Anemia Panel: No results for input(s): VITAMINB12, FOLATE, FERRITIN, TIBC, IRON, RETICCTPCT in the last 72 hours. Urine analysis: No results found for: COLORURINE, APPEARANCEUR, LABSPEC, PHURINE, GLUCOSEU, HGBUR, BILIRUBINUR, KETONESUR, PROTEINUR, UROBILINOGEN, NITRITE, LEUKOCYTESUR Sepsis Labs: @LABRCNTIP (procalcitonin:4,lacticidven:4)  ) Recent Results (from  the past 240 hour(s))  SARS Coronavirus 2 [facility] order, Performed in [facility] hospital lab) Nasopharyngeal Nasopharyngeal Swab     Status: None   Collection Time: 02/20/19 11:42 AM   Specimen: Nasopharyngeal Swab  Result Value Ref Range Status   SARS Coronavirus 2 NEGATIVE NEGATIVE Final    Comment: (NOTE) If result is NEGATIVE SARS-CoV-2 target nucleic acids are NOT DETECTED. The SARS-CoV-2 RNA is generally detectable in upper and lower  respiratory specimens during the acute phase of infection. The lowest  concentration of SARS-CoV-2 viral copies this assay can detect is 250  copies / mL. A negative result does not preclude SARS-CoV-2 infection  and should not be used as the sole basis for treatment or other  patient management decisions.  A negative result may occur with  improper specimen collection / handling, submission of specimen other  than nasopharyngeal swab, presence of viral mutation(s) within the  areas targeted by this assay, and inadequate number of viral copies  (<250 copies / mL). A negative result must be combined with clinical  observations, patient history, and epidemiological information. If result is POSITIVE SARS-CoV-2 target nucleic acids are DETECTED. The SARS-CoV-2 RNA is generally detectable in upper and lower  respiratory specimens dur ing the acute phase of infection.  Positive  results are indicative of active infection with SARS-CoV-2.  Clinical  correlation with patient history and other diagnostic information is  necessary to determine patient infection status.  Positive results do  not rule out bacterial infection or co-infection with other viruses. If result is PRESUMPTIVE POSTIVE SARS-CoV-2 nucleic acids MAY BE PRESENT.   A presumptive positive result was obtained on the submitted specimen  and confirmed on repeat testing.  While 2019 novel coronavirus  (SARS-CoV-2) nucleic acids may be present in the submitted sample  additional  confirmatory testing may be necessary for epidemiological  and / or clinical management purposes  to differentiate between  SARS-CoV-2 and other Sarbecovirus currently known to infect humans.  If clinically indicated additional testing with an alternate test  methodology [phone]) is advised. The SARS-CoV-2 RNA is generally  detectable in upper and lower respiratory sp ecimens during the acute  phase of infection. The expected result is Negative. Fact Sheet for Patients:  StrictlyIdeas.no Fact Sheet for Healthcare Providers: BankingDealers.co.za This test is not yet approved or cleared by the [place] FDA and has been authorized for detection and/or diagnosis of SARS-CoV-2 by FDA under an Emergency Use Authorization (EUA).  This EUA will remain in effect (meaning this test can be used) for the duration of the COVID-19 declaration under Section 564(b)(1) of the Act, 21 U.S.C. section 360bbb-3(b)(1), unless the authorization is terminated or revoked sooner. Performed at [facility], [address], [place], [place] [address]   Culture, blood (Routine X 2) w Reflex to ID Panel     Status: None   Collection Time: 02/20/19  2:24  PM   Specimen: Right Antecubital; Blood  Result Value Ref Range Status   Specimen Description   Final    RIGHT ANTECUBITAL BOTTLES DRAWN AEROBIC AND ANAEROBIC   Special Requests Blood Culture adequate volume  Final   Culture   Final    NO GROWTH 5 DAYS Performed at [facility], [address], [place], [place] [address]    Report Status 02/25/2019 FINAL  Final  MRSA PCR Screening     Status: None   Collection Time: 02/20/19  5:15 PM   Specimen: Nasopharyngeal  Result Value Ref Range Status   MRSA by PCR NEGATIVE NEGATIVE Final    Comment:        The GeneXpert MRSA Assay (FDA approved for NASAL specimens only), is one component of a comprehensive MRSA colonization surveillance program. It is not intended  to diagnose MRSA infection nor to guide or monitor treatment for MRSA infections. Performed at [facility], [place] [address] [place], [place] [address]   Culture, respiratory (tracheal aspirate)     Status: None   Collection Time: 02/20/19  6:11 PM   Specimen: Tracheal Aspirate; Respiratory  Result Value Ref Range Status   Specimen Description TRACHEAL ASPIRATE  Final   Special Requests NONE  Final   Gram Stain   Final    ABUNDANT WBC PRESENT,BOTH PMN AND MONONUCLEAR FEW GRAM VARIABLE ROD MODERATE YEAST Performed at [facility], [place] [address], [place], [place] [address]    Culture FEW CANDIDA GLABRATA  Final   Report Status 02/23/2019 FINAL  Final  Culture, blood (routine x 2)     Status: Abnormal   Collection Time: 02/20/19  6:26 PM   Specimen: BLOOD RIGHT HAND  Result Value Ref Range Status   Specimen Description BLOOD RIGHT HAND  Final   Special Requests   Final    AEROBIC BOTTLE ONLY Blood Culture results may not be optimal due to an inadequate volume of blood received in culture bottles   Culture  Setup Time   Final    GRAM POSITIVE COCCI IN CLUSTERS AEROBIC BOTTLE ONLY CRITICAL RESULT CALLED TO, READ BACK BY AND VERIFIED WITH: PHARMD TYLER BAUMEISTER [id number] FCP    Culture (A)  Final    STAPHYLOCOCCUS SPECIES (COAGULASE NEGATIVE) THE SIGNIFICANCE OF ISOLATING THIS ORGANISM FROM A SINGLE SET OF BLOOD CULTURES WHEN MULTIPLE SETS ARE DRAWN IS UNCERTAIN. PLEASE NOTIFY THE MICROBIOLOGY DEPARTMENT WITHIN ONE WEEK IF SPECIATION AND SENSITIVITIES ARE REQUIRED. Performed at [facility], [place] [address], [place], [place] [address]    Report Status 02/23/2019 FINAL  Final  Blood Culture ID Panel (Reflexed)     Status: Abnormal   Collection Time: 02/20/19  6:26 PM  Result Value Ref Range Status   Enterococcus species NOT DETECTED NOT DETECTED Final   Listeria monocytogenes NOT DETECTED NOT DETECTED Final   Staphylococcus species DETECTED (A) NOT DETECTED  Final    Comment: Methicillin (oxacillin) resistant coagulase negative staphylococcus. Possible blood culture contaminant (unless isolated from more than one blood culture draw or clinical case suggests pathogenicity). No antibiotic treatment is indicated for blood  culture contaminants. CRITICAL RESULT CALLED TO, READ BACK BY AND VERIFIED WITH: PHARMD TYLER BAUMEISTER [id number] FCP    Staphylococcus aureus (BCID) NOT DETECTED NOT DETECTED Final   Methicillin resistance DETECTED (A) NOT DETECTED Final    Comment: CRITICAL RESULT CALLED TO, READ BACK BY AND VERIFIED WITH: PHARMD TYLER BAUMEISTER [id number] FCP    Streptococcus  species NOT DETECTED NOT DETECTED Final   Streptococcus agalactiae NOT DETECTED NOT DETECTED Final   Streptococcus pneumoniae NOT DETECTED NOT DETECTED Final   Streptococcus pyogenes NOT DETECTED NOT DETECTED Final   Acinetobacter baumannii NOT DETECTED NOT DETECTED Final   Enterobacteriaceae species NOT DETECTED NOT DETECTED Final   Enterobacter cloacae complex NOT DETECTED NOT DETECTED Final   Escherichia coli NOT DETECTED NOT DETECTED Final   Klebsiella oxytoca NOT DETECTED NOT DETECTED Final   Klebsiella pneumoniae NOT DETECTED NOT DETECTED Final   Proteus species NOT DETECTED NOT DETECTED Final   Serratia marcescens NOT DETECTED NOT DETECTED Final   Haemophilus influenzae NOT DETECTED NOT DETECTED Final   Neisseria meningitidis NOT DETECTED NOT DETECTED Final   Pseudomonas aeruginosa NOT DETECTED NOT DETECTED Final   Candida albicans NOT DETECTED NOT DETECTED Final   Candida glabrata NOT DETECTED NOT DETECTED Final   Candida krusei NOT DETECTED NOT DETECTED Final   Candida parapsilosis NOT DETECTED NOT DETECTED Final   Candida tropicalis NOT DETECTED NOT DETECTED Final    Comment: Performed at [facility], [place] [address], [place], [place] [address]  Culture, blood (routine x 2)     Status: None   Collection Time: 02/20/19  7:01 PM   Specimen:  BLOOD RIGHT HAND  Result Value Ref Range Status   Specimen Description BLOOD RIGHT HAND  Final   Special Requests   Final    AEROBIC BOTTLE ONLY Blood Culture results may not be optimal due to an inadequate volume of blood received in culture bottles   Culture   Final    NO GROWTH 5 DAYS Performed at [facility], [place] [address], [place], [place] [address]    Report Status 02/25/2019 FINAL  Final  Gram stain     Status: None   Collection Time: 02/21/19 11:26 AM   Specimen: Pleura  Result Value Ref Range Status   Specimen Description PLEURAL RIGHT  Final   Special Requests NONE  Final   Gram Stain   Final    WBC PRESENT,BOTH PMN AND MONONUCLEAR NO ORGANISMS SEEN CYTOSPIN SMEAR Performed at [facility], [address] [place], [place] [address]    Report Status 02/21/2019 FINAL  Final  Culture, body fluid-bottle     Status: None   Collection Time: 02/21/19 11:26 AM   Specimen: Pleura  Result Value Ref Range Status   Specimen Description PLEURAL RIGHT  Final   Special Requests NONE  Final   Culture   Final    NO GROWTH 5 DAYS Performed at [place] [address] [place],  [address]    Report Status 02/26/2019 FINAL  Final         Radiology Studies: No results found.      Scheduled Meds: . amLODipine  5 mg Per Tube BID  . aspirin  81 mg Per Tube Daily  . carvedilol  25 mg Per Tube BID WC  . chlorhexidine  15 mL Mouth Rinse BID  . [START ON 03/01/2019] Chlorhexidine Gluconate Cloth  6 each Topical Q0600  . cloNIDine  0.2 mg Per Tube TID  . [START ON 03/02/2019] darbepoetin (ARANESP) injection - DIALYSIS  100 mcg Intravenous Q Tue-HD  . dicyclomine  10 mg Per Tube BID  . docusate  100 mg Per Tube Daily  . enoxaparin (LOVENOX) injection  30 mg Subcutaneous Q24H  . feeding supplement (PRO-STAT SUGAR FREE 64)  30 mL Per Tube BID  .  gabapentin  100 mg Per Tube QHS  . hydrALAZINE  100 mg Per Tube Q8H  . insulin aspart  1-3 Units  Subcutaneous Q4H  . lisinopril  20 mg Per Tube Daily  . mouth rinse  15 mL Mouth Rinse q12n4p  . multivitamin  1 tablet Oral QHS  . pantoprazole sodium  40 mg Per Tube Daily   Continuous Infusions: . sodium chloride    . sodium chloride    . sodium chloride    . sodium chloride    . feeding supplement (VITAL 1.5 CAL) 45 mL/hr at 02/28/19 1300  . piperacillin-tazobactam (ZOSYN)  IV 12.5 mL/hr at 02/28/19 1100     LOS: 8 days    Time spent: 50min    Domenic Polite, MD [facility] Page via [email or web address], password [email or web address] After 7PM please contact night-coverage  02/28/2019, 2:00 PM

## 2019-02-28 NOTE — Progress Notes (Signed)
Assisted tele visit to patient with family member.  Melany Wiesman M, RN  

## 2019-03-01 MED ORDER — CLONIDINE 0.3 MG/24HR TD PTWK: 0.3000 mg | MEDICATED_PATCH | TRANSDERMAL | 0 refills | Status: AC

## 2019-03-01 MED ORDER — CLONIDINE HCL 0.3 MG/24HR TD PTWK
0.3000 mg | MEDICATED_PATCH | TRANSDERMAL | Status: DC
  Administered 2019-03-01: 09:00:00 0.3 mg via TRANSDERMAL
  Filled 2019-03-01: qty 1

## 2019-03-01 MED ORDER — OXYCODONE HCL 20 MG/ML PO CONC: 6.0000 mg | ORAL | 0 refills | Status: AC | PRN

## 2019-03-01 MED ORDER — LORAZEPAM 2 MG/ML PO CONC: 0.6000 mg | Freq: Three times a day (TID) | ORAL | 0 refills | Status: AC | PRN

## 2019-03-01 NOTE — Progress Notes (Signed)
[facility] ROUNDING NOTE   Subjective:   This a very pleasant 63 year old gentleman who isend-stage renal disease and receives dialysis on a Tuesday Thursday Saturday schedule.  He was admitted with acute hypoxic respiratory failure thought to be secondary to pneumonitis and a history of a left pleural effusion status post left VATS in July 2020.  He has a loculated basilar left hydro-with pneumothorax status post thoracentesis which was seen on CT scan.  He was extubated 02/24/2019.  Recommendation has been undertaken by hospice to proceed with palliative comfort care.  He is opted for no further dialysis  Blood pressure 165/55 pulse 70 temperature 98.7 O2 sats 100% 2 L nasal cannula  Most recent labs sodium 136 potassium 4.2 chloride 98 CO2 28 BUN 28 creatinine 3.75 glucose 172 calcium 9.1  Objective:  Vital signs in last 24 hours:  Temp:  [98 F (36.7 C)-98.9 F (37.2 C)] 98.7 F (37.1 C) (08/24 0800) Pulse Rate:  [58-79] 69 (08/24 0800) Resp:  [17-26] 20 (08/24 0800) BP: (130-191)/(48-77) 145/56 (08/24 0800) SpO2:  [99 %-100 %] 100 % (08/24 0800) FiO2 (%):  [97 %] 97 % (08/24 0800)  Weight change:  Filed Weights   02/27/19 0730 02/27/19 1122 02/28/19 0423  Weight: 58 kg 56.9 kg 57.1 kg    Intake/Output: I/O last 3 completed shifts: In: 1365.2 [NG/GT:1205; IV Piggyback:160.2] Out: -    Intake/Output this shift:  No intake/output data recorded. General:adult male in bed  HEENT: MMM [place] AT anicteric sclera CV: RRR no rub  Lungs: has coarse breath sounds bilaterally; 2 liters oxygen Abd: abd SNT/ND  Extremities: no lower extremity edema  Neuro: awake; oriented to self  Access: left upper extremity aneurysmal AV graft with thrill and bruit    Basic Metabolic Panel: Recent Labs  Lab 02/23/19 0322  02/24/19 0347 02/25/19 0504 02/25/19 0722 02/26/19 0413 02/27/19 0339 02/28/19 0341  NA 130*   < > 133* 134* 133* 135 137 136  K 3.8   < > 3.5 3.6  3.6 2.9* 3.8 4.2  CL 90*  --  94* 93* 94* 96* 98 98  CO2 26  --  27 26 24 26 28 28   GLUCOSE 128*  --  198* 115* 111* 170* 142* 172*  BUN 49*  --  29* 45* 48* 29* 40* 28*  CREATININE 6.20*  --  3.75* 5.44* 5.49* 3.86* 5.27* 3.75*  CALCIUM 8.7*  --  9.1 9.2 9.5 9.1 9.5 9.1  MG 2.1  --  2.2 2.3  --  2.3  --   --   PHOS 4.1  --  2.3* 3.0 3.2 2.9  --   --    < > = values in this interval not displayed.    Liver Function Tests: Recent Labs  Lab 02/25/19 0722 02/27/19 0339  AST  --  16  ALT  --  12  ALKPHOS  --  80  BILITOT  --  0.7  PROT  --  6.2*  ALBUMIN 2.0* 2.0*   No results for input(s): LIPASE, AMYLASE in the last 168 hours. No results for input(s): AMMONIA in the last 168 hours.  CBC: Recent Labs  Lab 02/23/19 0322 02/24/19 0337 02/24/19 0347 02/25/19 0722 02/27/19 0339  WBC 12.5*  --  9.9 10.2 9.0  HGB 7.9* 8.5* 8.0* 8.4* 8.7*  HCT 24.7* 25.0* 25.7* 26.0* 28.4*  MCV 80.2  --  81.6 79.5* 82.1  PLT 189  --  204 253 236  Cardiac Enzymes: No results for input(s): CKTOTAL, CKMB, CKMBINDEX, TROPONINI in the last 168 hours.  BNP: Invalid input(s): POCBNP  CBG: Recent Labs  Lab 02/28/19 0041 02/28/19 0335 02/28/19 0752 02/28/19 1201 02/28/19 1603  GLUCAP 199* 156* 141* 166* 129*    Microbiology: Results for orders placed or performed during the hospital encounter of 02/20/19  SARS Coronavirus 2 [facility] order, Performed in [facility] hospital lab) Nasopharyngeal Nasopharyngeal Swab     Status: None   Collection Time: 02/20/19 11:42 AM   Specimen: Nasopharyngeal Swab  Result Value Ref Range Status   SARS Coronavirus 2 NEGATIVE NEGATIVE Final    Comment: (NOTE) If result is NEGATIVE SARS-CoV-2 target nucleic acids are NOT DETECTED. The SARS-CoV-2 RNA is generally detectable in upper and lower  respiratory specimens during the acute phase of infection. The lowest  concentration of SARS-CoV-2 viral copies this assay can detect is 250  copies / mL. A  negative result does not preclude SARS-CoV-2 infection  and should not be used as the sole basis for treatment or other  patient management decisions.  A negative result may occur with  improper specimen collection / handling, submission of specimen other  than nasopharyngeal swab, presence of viral mutation(s) within the  areas targeted by this assay, and inadequate number of viral copies  (<250 copies / mL). A negative result must be combined with clinical  observations, patient history, and epidemiological information. If result is POSITIVE SARS-CoV-2 target nucleic acids are DETECTED. The SARS-CoV-2 RNA is generally detectable in upper and lower  respiratory specimens dur ing the acute phase of infection.  Positive  results are indicative of active infection with SARS-CoV-2.  Clinical  correlation with patient history and other diagnostic information is  necessary to determine patient infection status.  Positive results do  not rule out bacterial infection or co-infection with other viruses. If result is PRESUMPTIVE POSTIVE SARS-CoV-2 nucleic acids MAY BE PRESENT.   A presumptive positive result was obtained on the submitted specimen  and confirmed on repeat testing.  While 2019 novel coronavirus  (SARS-CoV-2) nucleic acids may be present in the submitted sample  additional confirmatory testing may be necessary for epidemiological  and / or clinical management purposes  to differentiate between  SARS-CoV-2 and other Sarbecovirus currently known to infect humans.  If clinically indicated additional testing with an alternate test  methodology [phone]) is advised. The SARS-CoV-2 RNA is generally  detectable in upper and lower respiratory sp ecimens during the acute  phase of infection. The expected result is Negative. Fact Sheet for Patients:  StrictlyIdeas.no Fact Sheet for Healthcare Providers: BankingDealers.co.za This test is not  yet approved or cleared by the [place] FDA and has been authorized for detection and/or diagnosis of SARS-CoV-2 by FDA under an Emergency Use Authorization (EUA).  This EUA will remain in effect (meaning this test can be used) for the duration of the COVID-19 declaration under Section 564(b)(1) of the Act, 21 U.S.C. section 360bbb-3(b)(1), unless the authorization is terminated or revoked sooner. Performed at [facility], [address], [place], [place] [address]   Culture, blood (Routine X 2) w Reflex to ID Panel     Status: None   Collection Time: 02/20/19  2:24 PM   Specimen: Right Antecubital; Blood  Result Value Ref Range Status   Specimen Description   Final    RIGHT ANTECUBITAL BOTTLES DRAWN AEROBIC AND ANAEROBIC   Special Requests Blood Culture adequate volume  Final   Culture   Final  NO GROWTH 5 DAYS Performed at [facility], [address], [place], [place] [address]    Report Status 02/25/2019 FINAL  Final  MRSA PCR Screening     Status: None   Collection Time: 02/20/19  5:15 PM   Specimen: Nasopharyngeal  Result Value Ref Range Status   MRSA by PCR NEGATIVE NEGATIVE Final    Comment:        The GeneXpert MRSA Assay (FDA approved for NASAL specimens only), is one component of a comprehensive MRSA colonization surveillance program. It is not intended to diagnose MRSA infection nor to guide or monitor treatment for MRSA infections. Performed at [facility], [place] [address], [place], [place] [address]   Culture, respiratory (tracheal aspirate)     Status: None   Collection Time: 02/20/19  6:11 PM   Specimen: Tracheal Aspirate; Respiratory  Result Value Ref Range Status   Specimen Description TRACHEAL ASPIRATE  Final   Special Requests NONE  Final   Gram Stain   Final    ABUNDANT WBC PRESENT,BOTH PMN AND MONONUCLEAR FEW GRAM VARIABLE ROD MODERATE YEAST Performed at [facility], [place] [address] [place], [place] [address]    Culture FEW  CANDIDA GLABRATA  Final   Report Status 02/23/2019 FINAL  Final  Culture, blood (routine x 2)     Status: Abnormal   Collection Time: 02/20/19  6:26 PM   Specimen: BLOOD RIGHT HAND  Result Value Ref Range Status   Specimen Description BLOOD RIGHT HAND  Final   Special Requests   Final    AEROBIC BOTTLE ONLY Blood Culture results may not be optimal due to an inadequate volume of blood received in culture bottles   Culture  Setup Time   Final    GRAM POSITIVE COCCI IN CLUSTERS AEROBIC BOTTLE ONLY CRITICAL RESULT CALLED TO, READ BACK BY AND VERIFIED WITH: PHARMD TYLER BAUMEISTER [id number] FCP    Culture (A)  Final    STAPHYLOCOCCUS SPECIES (COAGULASE NEGATIVE) THE SIGNIFICANCE OF ISOLATING THIS ORGANISM FROM A SINGLE SET OF BLOOD CULTURES WHEN MULTIPLE SETS ARE DRAWN IS UNCERTAIN. PLEASE NOTIFY THE MICROBIOLOGY DEPARTMENT WITHIN ONE WEEK IF SPECIATION AND SENSITIVITIES ARE REQUIRED. Performed at [facility], [place] [address] [place], [place] [address]    Report Status 02/23/2019 FINAL  Final  Blood Culture ID Panel (Reflexed)     Status: Abnormal   Collection Time: 02/20/19  6:26 PM  Result Value Ref Range Status   Enterococcus species NOT DETECTED NOT DETECTED Final   Listeria monocytogenes NOT DETECTED NOT DETECTED Final   Staphylococcus species DETECTED (A) NOT DETECTED Final    Comment: Methicillin (oxacillin) resistant coagulase negative staphylococcus. Possible blood culture contaminant (unless isolated from more than one blood culture draw or clinical case suggests pathogenicity). No antibiotic treatment is indicated for blood  culture contaminants. CRITICAL RESULT CALLED TO, READ BACK BY AND VERIFIED WITH: PHARMD TYLER BAUMEISTER [id number] FCP    Staphylococcus aureus (BCID) NOT DETECTED NOT DETECTED Final   Methicillin resistance DETECTED (A) NOT DETECTED Final    Comment: CRITICAL RESULT CALLED TO, READ BACK BY AND VERIFIED WITH: PHARMD TYLER BAUMEISTER [id number]  FCP    Streptococcus species NOT DETECTED NOT DETECTED Final   Streptococcus agalactiae NOT DETECTED NOT DETECTED Final   Streptococcus pneumoniae NOT DETECTED NOT DETECTED Final   Streptococcus pyogenes NOT DETECTED NOT DETECTED Final   Acinetobacter baumannii NOT DETECTED NOT DETECTED Final   Enterobacteriaceae species NOT DETECTED NOT  DETECTED Final   Enterobacter cloacae complex NOT DETECTED NOT DETECTED Final   Escherichia coli NOT DETECTED NOT DETECTED Final   Klebsiella oxytoca NOT DETECTED NOT DETECTED Final   Klebsiella pneumoniae NOT DETECTED NOT DETECTED Final   Proteus species NOT DETECTED NOT DETECTED Final   Serratia marcescens NOT DETECTED NOT DETECTED Final   Haemophilus influenzae NOT DETECTED NOT DETECTED Final   Neisseria meningitidis NOT DETECTED NOT DETECTED Final   Pseudomonas aeruginosa NOT DETECTED NOT DETECTED Final   Candida albicans NOT DETECTED NOT DETECTED Final   Candida glabrata NOT DETECTED NOT DETECTED Final   Candida krusei NOT DETECTED NOT DETECTED Final   Candida parapsilosis NOT DETECTED NOT DETECTED Final   Candida tropicalis NOT DETECTED NOT DETECTED Final    Comment: Performed at [facility], [place] [address], [place], [place] [address]  Culture, blood (routine x 2)     Status: None   Collection Time: 02/20/19  7:01 PM   Specimen: BLOOD RIGHT HAND  Result Value Ref Range Status   Specimen Description BLOOD RIGHT HAND  Final   Special Requests   Final    AEROBIC BOTTLE ONLY Blood Culture results may not be optimal due to an inadequate volume of blood received in culture bottles   Culture   Final    NO GROWTH 5 DAYS Performed at [facility], [place] [address], [place], [place] [address]    Report Status 02/25/2019 FINAL  Final  Gram stain     Status: None   Collection Time: 02/21/19 11:26 AM   Specimen: Pleura  Result Value Ref Range Status   Specimen Description PLEURAL RIGHT  Final   Special Requests NONE  Final   Gram Stain    Final    WBC PRESENT,BOTH PMN AND MONONUCLEAR NO ORGANISMS SEEN CYTOSPIN SMEAR Performed at [facility], [address], [place], [place] [address]    Report Status 02/21/2019 FINAL  Final  Culture, body fluid-bottle     Status: None   Collection Time: 02/21/19 11:26 AM   Specimen: Pleura  Result Value Ref Range Status   Specimen Description PLEURAL RIGHT  Final   Special Requests NONE  Final   Culture   Final    NO GROWTH 5 DAYS Performed at [place] [address], [place], [place] [address]    Report Status 02/26/2019 FINAL  Final    Coagulation Studies: No results for input(s): LABPROT, INR in the last 72 hours.  Urinalysis: No results for input(s): COLORURINE, LABSPEC, PHURINE, GLUCOSEU, HGBUR, BILIRUBINUR, KETONESUR, PROTEINUR, UROBILINOGEN, NITRITE, LEUKOCYTESUR in the last 72 hours.  Invalid input(s): APPERANCEUR    Imaging: No results found.   Medications:   . feeding supplement (VITAL 1.5 CAL) 1,000 mL (02/28/19 2148)  . piperacillin-tazobactam (ZOSYN)  IV Stopped (03/01/19 0239)   . amLODipine  5 mg Per Tube BID  . carvedilol  25 mg Per Tube BID WC  . chlorhexidine  15 mL Mouth Rinse BID  . Chlorhexidine Gluconate Cloth  6 each Topical Q0600  . cloNIDine  0.3 mg Transdermal Weekly  . [START ON 03/02/2019] darbepoetin (ARANESP) injection - DIALYSIS  100 mcg Intravenous Q Tue-HD  . dicyclomine  10 mg Per Tube BID  . docusate  100 mg Per Tube Daily  . gabapentin  100 mg Per Tube QHS  . hydrALAZINE  100 mg Per Tube Q8H  . lisinopril  20 mg Per Tube Daily  . mouth rinse  15 mL Mouth Rinse q12n4p  . pantoprazole sodium  40 mg Per Tube Daily   acetaminophen (TYLENOL) oral liquid 160 mg/5 mL, antiseptic oral rinse, bisacodyl, [DISCONTINUED] glycopyrrolate **OR** glycopyrrolate **OR** glycopyrrolate, [DISCONTINUED] haloperidol **OR** haloperidol **OR** haloperidol lactate, hydrALAZINE, ipratropium-albuterol, lidocaine (PF), lidocaine-prilocaine,  [DISCONTINUED] LORazepam **OR** LORazepam **OR** [DISCONTINUED] LORazepam, ondansetron **OR** ondansetron (ZOFRAN) IV, oxyCODONE, polyvinyl alcohol  Assessment/ Plan:   ESRD-hemodialysis Tuesday Thursday Saturday patient is to withdraw from dialysis and is transitioning to comfort care.  I agree with this.  We will therefore not schedule any further dialysis treatments  Acute hypoxic respiratory failure secondary to pneumonitis.  Hypertension appears to be adequately controlled  Acute metabolic encephalopathy with labile blood sugars  Malnutrition with failure to thrive on dialysis   LOS: [place] @TODAY @9 :03 AM

## 2019-03-01 NOTE — TOC Transition Note (Signed)
Transition of Care [facility]) - CM/SW Discharge Note   Patient Details  Name: Bahe Sankey MRN: [id number] Date of Birth: Aug 13, 1955  Transition of Care [facility]) CM/SW Contact:  Bartholomew Crews, RN Phone Number: [phone] 03/01/2019, 12:26 PM   Clinical Narrative:    Damaris Schooner with Alcide Goodness at [facility], [phone]. Spoke with patient spouse to discuss transport home via [facility] scheduled for 1pm. Spoke with [facility] to arrange transport home. Spoke with palliative at the bedside. Medical necessity form on chart. DNR form on chart for MD to complete. MD to send prescriptions to [facility] in [place] as verified by patient spouse. No further transition of care needs at this time.    Final next level of care: Home w Hospice Care Barriers to Discharge: No Barriers Identified   Patient Goals and CMS Choice Patient states their goals for this hospitalization and ongoing recovery are:: return home for end of life care CMS Medicare.gov Compare Post Acute Care list provided to:: Patient Choice offered to / list presented to : Patient, Spouse  Discharge Placement                Patient to be transferred to facility by: [place] Name of family member notified: [name] Nan Patient and family notified of of transfer: 03/01/19  Discharge Plan and Services                DME Arranged: N/A DME Agency: NA         [place] Agency: Other - See comment([facility]) Date [place]: 03/01/19 Time HH Agency Contacted: 1000 Representative spoke with at [place]: [place] Determinants of Health ([place]) Interventions     Readmission Risk Interventions No flowsheet data found.

## 2019-03-01 NOTE — Progress Notes (Signed)
Daily Progress Note   Patient Name: Cole Nelson       Date: 03/01/2019 DOB: December 10, 1955  Age: 63 y.o. MRN#: [id number] Attending Physician: Domenic Polite, MD Primary Care Physician: Caprice Renshaw, MD Admit Date: 02/20/2019  Reason for Consultation/Follow-up: Establishing goals of care, Psychosocial/spiritual support and Terminal Care  Subjective: Received a call from Dr. Broadus John this morning.  Patient is more lethargic today and really not swallowing at all.  At bedside the patient is able respond yes and no to me but he quickly falls back to sleep.    Discussed with ICU RN.  Called Wife.  Explained patient is really unable to swallow much at all.  Discussed keeping his mouth moist to reduce the sensation of thirst.  Explained that at this phase in his life you have to let go of his maintenance medications and just focus on symptom management.  Explained that we will send home prescriptions for sub lingual pain and anxiety medications.    Wife accepted the information well.  "I just want him home with family, If I can get that I will be satisfied that's all I want"  Cole Nelson understands he is at end of life and can no longer swallow.  She was very appreciative of the care we have given to [place] and the family.   Assessment: Home with hospice today.   Patient Profile/HPI:  63 y.o. male "Cole Nelson " with past medical history of ESRD on HD, advanced multiple sclerosis, DM with gastroparesis, CVA with left sided hemiparesis (0000000), diastolic dysfunction who was admitted on 02/20/2019 with respiratory distress secondary to aspiration pneumonia.  He had a CBG of 31.  He required intubation. He is now extubated but continues to have dysphagia and altered mental status.  On 8/16 a parapneumonic  effusion was drained and found to be exudative.  He is currently receiving tube feeds.  His swallow evaluation concerning for aspiration.  Of note he was hospitalized from 7/23 - 8/12 with vomiting from gastroparesis.  On 7/13 he underwent VATS for a left pleural effusion.  He now has a loculated left hydropneumothorax with a possible pleural mass.     Length of Stay: 9  Current Medications: Scheduled Meds:  . amLODipine  5 mg Per Tube BID  . carvedilol  25 mg Per Tube BID WC  .  chlorhexidine  15 mL Mouth Rinse BID  . Chlorhexidine Gluconate Cloth  6 each Topical Q0600  . cloNIDine  0.3 mg Transdermal Weekly  . [START ON 03/02/2019] darbepoetin (ARANESP) injection - DIALYSIS  100 mcg Intravenous Q Tue-HD  . dicyclomine  10 mg Per Tube BID  . docusate  100 mg Per Tube Daily  . gabapentin  100 mg Per Tube QHS  . hydrALAZINE  100 mg Per Tube Q8H  . lisinopril  20 mg Per Tube Daily  . mouth rinse  15 mL Mouth Rinse q12n4p  . pantoprazole sodium  40 mg Per Tube Daily    Continuous Infusions: . feeding supplement (VITAL 1.5 CAL) 1,000 mL (03/01/19 0926)  . piperacillin-tazobactam (ZOSYN)  IV 3.375 g (03/01/19 0927)    PRN Meds: acetaminophen (TYLENOL) oral liquid 160 mg/5 mL, antiseptic oral rinse, bisacodyl, [DISCONTINUED] glycopyrrolate **OR** glycopyrrolate **OR** glycopyrrolate, [DISCONTINUED] haloperidol **OR** haloperidol **OR** haloperidol lactate, hydrALAZINE, ipratropium-albuterol, lidocaine (PF), lidocaine-prilocaine, [DISCONTINUED] LORazepam **OR** LORazepam **OR** [DISCONTINUED] LORazepam, ondansetron **OR** ondansetron (ZOFRAN) IV, oxyCODONE, polyvinyl alcohol  Physical Exam        Thin frail, severely deconditioned and lethargic.  Is able to wake and speak 1 syllable words to me.  Vital Signs: BP (!) 165/55   Pulse 71   Temp 98.7 F (37.1 C) (Oral)   Resp 17   Ht 5\' 11"  (1.803 m)   Wt 57.1 kg   SpO2 100%   BMI 17.56 kg/m  SpO2: SpO2: 100 % O2 Device: O2  Device: Nasal Cannula O2 Flow Rate: O2 Flow Rate (L/min): 2 L/min  Intake/output summary:   Intake/Output Summary (Last 24 hours) at 03/01/2019 1104 Last data filed at 03/01/2019 0700 Gross per 24 hour  Intake 701.39 ml  Output -  Net 701.39 ml   LBM: Last BM Date: 02/28/19 Baseline Weight: Weight: 61.2 kg Most recent weight: Weight: 57.1 kg       Palliative Assessment/Data: 10%      Patient Active Problem List   Diagnosis Date Noted  . HCAP (healthcare-associated pneumonia)   . Comfort measures only status   . Aspiration pneumonia of right lung ([place])   . ESRD on dialysis ([place])   . Multiple sclerosis ([place])   . Palliative care encounter   . Respiratory failure with hypoxia ([place]) 02/20/2019  . AMS (altered mental status) 02/20/2019  . Nausea and vomiting   . Acute gastric erosion   . Abnormal CT scan, small bowel   . Protein-calorie malnutrition, severe 01/29/2019  . Acute encephalopathy 01/28/2019  . Pressure injury of skin 01/28/2019  . Pleural effusion   . End-stage renal disease on hemodialysis [facility])     Palliative Care Plan    Recommendations/Plan:  Pull cor trak  Home with [facility] today.  Will need Rx for sublingual ativan solution and sublingual concentrated roxicodone solution (pain or SOB)  Goals of Care and Additional Recommendations:  Limitations on Scope of Treatment: Full Comfort Care  Code Status:  DNR  Prognosis:   Hours - Days   Discharge Planning:  Home with Hospice  Care plan was discussed with MD, RN, Wife  Thank you for allowing the Palliative Medicine Team to assist in the care of this patient.  Total time spent:  35 min.     Greater than 50%  of this time was spent counseling and coordinating care related to the above assessment and plan.  Florentina Jenny, PA-C Palliative Medicine  Please contact Palliative MedicineTeam phone at [phone] for  questions and concerns between 7 am - 7 pm.   Please see  AMION for individual provider pager numbers.

## 2019-03-01 NOTE — Progress Notes (Signed)
Renal Navigator notified OP HD clinic/[facility] of decision for home with Hospice for comfort care at end of life.  Alphonzo Cruise, [place] Renal Navigator [phone]

## 2019-03-09 NOTE — Discharge Summary (Signed)
Physician Discharge Summary  Cole Nelson [id number] DOB: 07-04-1956 DOA: 02/20/2019  PCP: Caprice Renshaw, MD  Admit date: 02/20/2019 Discharge date: 03/01/2019  Time spent: 35 minutes  Recommendations for Outpatient Follow-up:  1. Home with Hospice   Discharge Diagnoses:    Acute hypoxic resp failure   Advanced Multiple sclerosis   Severe malnutrition   Dysphagia   Aspiration pneumonia   Respiratory failure with hypoxia (HCC)   AMS (altered mental status)   Aspiration pneumonia of right lung (HCC)   ESRD on dialysis ([place])   Multiple sclerosis ([place])   Palliative care encounter   HCAP (healthcare-associated pneumonia)   Comfort measures only status   Discharge Condition: poor  Diet recommendation: comfort feeds  Filed Weights   02/27/19 0730 02/27/19 1122 02/28/19 0423  Weight: 58 kg 56.9 kg 57.1 kg    History of present illness:  This is a chronically ill 63 year old African-American male from SNF with ESRD on hemodialysis, advanced multiple sclerosis, type 2 diabetes mellitus, history of CVA/left hemiplegia, gastroparesis, history of left pleural effusion status post VATS in 01/18/19, chronic left pleural effusion. -He underwent VATS, chest tube placement on 7/13, reportedly found to have a pleural pulsatile mass. CT scan showed consolidation or mass along the pleural surface of the anterior left upper lobe, outpatient follow-up at [facility] [place] was recommended  -Admitted to [facility], ICU with respiratory failure, aspiration pneumonia required mechanical ventilation.  Hospital Course:  Acute hypoxic respiratory failure -Status post mechanical ventilation, extubated on 8/19 -Felt to be secondary to aspiration pneumonia and hypoglycemia -Also has history of chronic left pleural effusion with loculation, status post VATS in July, concern for pulsatile pleural-based mass versus consolidation, outpatient tertiary center follow-up recommended then -Day 9 IV Zosyn today,  discontinue antibiotics after 10 days to complete course for aspiration pneumonia/parapneumonic effusion -Blood cultures negative, respiratory cultures with some yeast, pleural fluid cultures negative, -Continues to remain high risk for aspiration -s/p Palliative meeting, plan to stop dialysis and go home with hospice services  Recurrent left pleural effusion -history of chronic left pleural effusion with loculation, status post VATS in July, concern for pulsatile pleural-based mass versus consolidation, outpatient tertiary center follow-up recommended then -Had thoracentesis by IR done on 8/16, 400 cc of yellow fluid was drained, this was exudative by LDH, suspected to be a parapneumonic effusion, per Dr.Yacoub, they were not concerned about empyema, no further interventions felt to be indicated, left lower lung was felt to be likely trapped, cultures from pleural fluid are negative -completed Abx course -now comfort care  Severe dysphagia -SLP following, noted to have severe oropharyngeal dysphasia -given tube feeds via NG tube -Continues to be high risk of aspiration, difficulty managing secretions -Palliative medicine consulted for goals of care, appreciate assistance, I called and discussed with wife as well -No plans for PEG tube, continuing dialysis in this debilitated state will be extremely challenging -Now DNR  -s/p Palliative meeting with family, decision made for discharge home with hospice  ESRD on hemodialysis -now comfort care, stopped HD  Choreoathetosis of trunk, head and orofacial dyskinesia -Seen by neurology, started on Haldol and then Zyprexa, these movements have resolved -Patient was restarted on his home regimen of Bentyl and gabapentin -Zyprexa discontinued due to oversedation  Advanced multiple sclerosis -SNF resident, almost total care -Resumed gabapentin and Bentyl at a lower dose  Hypertension -now comfort care  History of CVA, left  hemiplegia -Continue aspirin  Severe protein calorie malnutrition -Continue tube feeds via NG  tubeAnemia of chronic disease -Stable  Hypertension -Continue carvedilol, clonidine, hydralazine, lisinopril  History of CVA, left hemiplegia -Continue aspirin  Severe protein calorie malnutrition -Continue tube feeds via NG tube  History of gastroparesis  Right sacral pressure ulcer -Present on admission, continue wound care  Type 2 diabetes mellitus   Code Status: DNR  Discharge Exam: Vitals:   03/01/19 1148 03/01/19 1200  BP:  (!) 150/62  Pulse:  72  Resp:  17  Temp: 98.2 F (36.8 C)   SpO2:  100%    General: obtunded, poorly responsive Cardiovascular: S1S2/RRR Respiratory: decreased BS at bases  Discharge Instructions   Discharge Instructions    Discharge instructions   Complete by: As directed    Comfort care, home with Hospice     Allergies as of 03/01/2019      Reactions   Baclofen    confusion   Insulins    Patient states his mind started going in and out.       Medication List    STOP taking these medications   amLODipine 5 MG tablet Commonly known as: NORVASC   Arnuity Ellipta 100 MCG/ACT Aepb Generic drug: Fluticasone Furoate   aspirin 81 MG chewable tablet   atorvastatin 20 MG tablet Commonly known as: LIPITOR   b complex-vitamin c-folic acid 0.8 MG Tabs tablet   carvedilol 25 MG tablet Commonly known as: COREG   cholecalciferol 25 MCG (1000 UT) tablet Commonly known as: VITAMIN D   cloNIDine 0.3 MG tablet Commonly known as: CATAPRES   Copaxone 40 MG/ML Sosy Generic drug: Glatiramer Acetate   dicyclomine 20 MG tablet Commonly known as: BENTYL   ferrous sulfate 325 (65 FE) MG tablet   fexofenadine 180 MG tablet Commonly known as: ALLEGRA   Flovent Diskus 100 MCG/BLIST Aepb Generic drug: Fluticasone Propionate (Inhal)   FLUoxetine 20 MG capsule Commonly known as: PROZAC   fluticasone 50 MCG/ACT nasal  spray Commonly known as: FLONASE   Folic Acid-Vit Q000111Q [id number] 0.5-5-0.2 MG Tabs   gabapentin 100 MG capsule Commonly known as: NEURONTIN   hydrALAZINE 100 MG tablet Commonly known as: APRESOLINE   HYDROcodone-acetaminophen 5-325 MG tablet Commonly known as: NORCO/VICODIN   Levemir FlexTouch 100 UNIT/ML Pen Generic drug: Insulin Detemir   lisinopril 10 MG tablet Commonly known as: ZESTRIL   montelukast 10 MG tablet Commonly known as: SINGULAIR   ondansetron 4 MG disintegrating tablet Commonly known as: ZOFRAN-ODT   ondansetron 4 MG tablet Commonly known as: ZOFRAN   PriLOSEC 20 MG capsule Generic drug: omeprazole   Tradjenta 5 MG Tabs tablet Generic drug: linagliptin   traMADol 50 MG tablet Commonly known as: ULTRAM   traZODone 50 MG tablet Commonly known as: DESYREL     TAKE these medications   cloNIDine 0.3 mg/24hr patch Commonly known as: CATAPRES - Dosed in mg/24 hr Place 1 patch (0.3 mg total) onto the skin once a week.   LORazepam 2 MG/ML concentrated solution Commonly known as: ATIVAN Place 0.3 mLs (0.6 mg total) under the tongue every 8 (eight) hours as needed for anxiety or sleep.   oxyCODONE 20 MG/ML concentrated solution Commonly known as: ROXICODONE INTENSOL Take 0.3 mLs (6 mg total) by mouth every 4 (four) hours as needed for severe pain (SOB, air hunger or for comfort at end of life.).      Allergies  Allergen Reactions  . Baclofen     confusion  . Insulins     Patient states his mind started going in and  out.    Follow-up Information    Caprice Renshaw, MD .   Specialty: Internal Medicine Contact information: [place] [id number] [phone]            The results of significant diagnostics from this hospitalization (including imaging, microbiology, ancillary and laboratory) are listed below for reference.    Significant Diagnostic Studies: Dg Abd 1 View  Result Date: 02/25/2019 CLINICAL DATA:  NG tube  placement. EXAM: ABDOMEN - 1 VIEW COMPARISON:  Chest x-ray from same date. FINDINGS: Diffuse gaseous distention of bowel noted without overt obstructive pattern. NG tube tip is in the mid stomach. Left pneumothorax better visualized on chest x-ray from earlier same day. Telemetry leads overlie the chest. IMPRESSION: NG tube tip is positioned in the mid stomach. Electronically Signed   By: Misty Stanley M.D.   On: 02/25/2019 09:13   Ct Head Wo Contrast  Result Date: 02/20/2019 CLINICAL DATA:  Altered level of consciousness. EXAM: CT HEAD WITHOUT CONTRAST TECHNIQUE: Contiguous axial images were obtained from the base of the skull through the vertex without intravenous contrast. COMPARISON:  None. FINDINGS: Brain: Mild generalized parenchymal volume loss with commensurate dilatation of the ventricles and sulci. No hydrocephalus. Chronic small vessel ischemic changes within the bilateral periventricular and subcortical white matter regions. Small old lacunar infarcts within the bilateral thalami. No mass, hemorrhage, edema or other evidence of acute parenchymal abnormality. No extra-axial hemorrhage. Vascular: Chronic calcified atherosclerotic changes of the large vessels at the skull base. No unexpected hyperdense vessel. Skull: Normal. Negative for fracture or focal lesion. Sinuses/Orbits: No acute finding. Other: None. IMPRESSION: 1. No acute findings. No intracranial mass, hemorrhage or edema. 2. Atrophy and chronic ischemic changes, as detailed above. Electronically Signed   By: Franki Cabot M.D.   On: 02/20/2019 11:44   Ct Chest Wo Contrast  Result Date: 02/20/2019 CLINICAL DATA:  Loculated hydropneumothorax EXAM: CT CHEST WITHOUT CONTRAST TECHNIQUE: Multidetector CT imaging of the chest was performed following the standard protocol without IV contrast. COMPARISON:  CT chest dated February 01, 2019 the of FINDINGS: Cardiovascular: The heart size is mildly enlarged. Aortic calcifications are noted. The main  pulmonary artery is not significantly dilated. The intracardiac blood pool is hypodense relative to the adjacent myocardium consistent with anemia. There is no significant pericardial effusion. Mediastinum/Nodes: There are some prominent mediastinal hilar lymph nodes that are not well evaluated on this exam. There is no significant axillary adenopathy. There is some prominent supraclavicular lymph nodes bilaterally. The thyroid gland is somewhat enlarged with multiple bilateral thyroid nodules, similar to prior study. Lungs/Pleura: There is a large left-sided hydropneumothorax, slightly improved from prior study. There is persistent atelectasis throughout the left upper and left lower lobes. This has improved since prior study. There is new extensive consolidation throughout the right lower lobe, right middle lobe, and right upper lobes. There are scattered airspace opacities throughout all remaining lobes. There is a ground-glass opacity in the left upper lobe. There is a small amount of debris within the trachea. The endotracheal tube terminates above the carina. There is a moderate-sized possibly loculated right-sided pleural effusion. There is no definite right-sided pneumothorax. Upper Abdomen: The enteric tube extends into the stomach. The tip is not visualized on this exam. Musculoskeletal: There is no displaced fracture. A vascular stent is noted in the left axillary region. IMPRESSION: 1. Persistent large left-sided hydropneumothorax with improved aeration within the left upper and left lower lobes. 2. New extensive pulmonary opacities throughout both lungs, with  large areas of consolidation involving the right lower lobe, right middle lobe, and right upper lobe. These findings are concerning for multifocal pneumonia versus aspiration. 3. Moderate size right-sided pleural effusion which may be partially loculated. 4. Probable mediastinal and mild supraclavicular adenopathy. This is not well evaluated in the  absence of IV contrast. This may be reactive to the above processes. Aortic Atherosclerosis (ICD10-I70.0). Electronically Signed   By: Constance Holster M.D.   On: 02/20/2019 22:45   Mr Brain Wo Contrast  Result Date: 02/23/2019 CLINICAL DATA:  Initial evaluation for unexplained altered mental status. History of prior CVA, and mass. EXAM: MRI HEAD WITHOUT CONTRAST TECHNIQUE: Multiplanar, multiecho pulse sequences of the brain and surrounding structures were obtained without intravenous contrast. COMPARISON:  Prior head CT from 02/20/2019. FINDINGS: Brain: Examination technically limited due to extensive motion artifact. Additionally, patient was unable to tolerate the full length of the exam. Diffuse prominence of the CSF containing spaces compatible with generalized cerebral atrophy. Patchy and confluent T2/FLAIR hyperintensity within the periventricular and deep white matter both cerebral hemispheres likely at least in part reflect sequelae of chronic multiple sclerosis, although a degree of chronic microvascular change suspected to be contributory as well. Multiple superimposed remote lacunar infarcts noted about the bilateral thalami and pons. No abnormal foci of restricted diffusion to suggest acute or subacute ischemia or active demyelination. No obvious areas of encephalomalacia to suggest prior cortical infarction. No appreciable acute intracranial hemorrhage on this limited exam. No mass lesion, midline shift, or mass effect. No hydrocephalus. No extra-axial fluid collection. Vascular: Major intracranial vascular flow voids are grossly maintained at the skull base. Skull and upper cervical spine: Craniocervical junction grossly within normal limits. No focal marrow replacing lesion. Scalp soft tissues demonstrate no acute finding. Sinuses/Orbits: Globes and orbital soft tissues grossly within normal limits. Paranasal sinuses are largely clear. Small left greater than right mastoid effusions noted.  Other: None. IMPRESSION: 1. Severely limited exam due to extensive motion artifact and patient's inability to tolerate the full length of the exam. 2. No definite acute intracranial abnormality. 3. Moderately advanced cerebral white matter changes, consistent with history of multiple sclerosis, although a degree of chronic small vessel ischemic changes is suspected to be contributory as well. 4. Few scattered superimposed remote lacunar infarcts about the bilateral thalami and pons. Electronically Signed   By: Jeannine Boga M.D.   On: 02/23/2019 23:29   Dg Chest Port 1 View  Result Date: 02/26/2019 CLINICAL DATA:  Respiratory failure. EXAM: PORTABLE CHEST 1 VIEW COMPARISON:  One-view chest x-ray 02/25/2019 FINDINGS: Heart size is normal. Left-sided pneumothorax is stable. Interstitial and airspace disease in the right lung has improved. Right IJ line is stable. NG tube courses off the inferior border the film. Residual contrast is noted in the stomach. IMPRESSION: 1. Stable left-sided pneumothorax. 2. Improving aeration of the right lung. Electronically Signed   By: San Morelle M.D.   On: 02/26/2019 07:40   Dg Chest Port 1 View  Result Date: 02/25/2019 CLINICAL DATA:  Respiratory failure EXAM: PORTABLE CHEST 1 VIEW COMPARISON:  02/24/2019 FINDINGS: Interval extubation. Right central line and NG tube remain in place, unchanged. New atelectasis in the right lower lobe. Improved aeration in the right upper lobe. Left upper lobe airspace opacity in left hydropneumothorax, unchanged. IMPRESSION: Interval extubation. New right lower lobe atelectasis/collapse. Improved aeration in the right upper lobe. Stable left hydropneumothorax. Electronically Signed   By: Rolm Baptise M.D.   On: 02/25/2019 10:08   Dg  Chest Port 1 View  Result Date: 02/24/2019 CLINICAL DATA:  Endotracheal tube placement EXAM: PORTABLE CHEST 1 VIEW COMPARISON:  02/23/2019 FINDINGS: Endotracheal tube with the tip 7 cm above  the carina. Nasogastric tube with the tip projecting over the stomach. Right jugular central venous catheter with the tip projecting over the SVC. Bilateral mild interstitial thickening. Right upper lobe hazy airspace disease which is increased compared with the prior exam. Stable small left hydropneumothorax. IMPRESSION: 1. Stable left hydropneumothorax measuring less than 10%. 2. Support lines and tubing in satisfactory position. 3. Increased right upper lobe airspace disease concerning for pneumonia. Electronically Signed   By: Kathreen Devoid   On: 02/24/2019 06:38   Dg Chest Port 1 View  Result Date: 02/23/2019 CLINICAL DATA:  Check endotracheal tube placement EXAM: PORTABLE CHEST 1 VIEW COMPARISON:  02/22/2019 FINDINGS: Cardiac shadow is stable. Endotracheal tube and gastric catheter as well as a right jugular line are again seen and stable. The lungs are well aerated bilaterally. There is a large left-sided hydropneumothorax slightly improved from the previous exam. There is mild interstitial change bilaterally consistent with underlying edema. No new focal abnormality is seen. IMPRESSION: Persistent left hydropneumothorax although slightly improved when compared with the prior exam. Persistent parenchymal edema. Electronically Signed   By: Inez Catalina M.D.   On: 02/23/2019 07:28   Dg Chest Port 1 View  Result Date: 02/22/2019 CLINICAL DATA:  Acute respiratory failure. EXAM: PORTABLE CHEST 1 VIEW COMPARISON:  One day prior FINDINGS: Endotracheal tube terminates 6.8 cm above carina. Nasogastric tube extends beyond the inferior aspect of the film. Right internal jugular line unchanged. Normal heart size. Moderate left-sided hydropneumothorax is not significantly changed. No mediastinal shift. No right-sided pneumothorax. Suspect trace right pleural fluid. Given differences in technique, similar diffuse interstitial thickening. IMPRESSION: No significant change since one day prior. Left-sided  hydropneumothorax, moderate and similar. Multifocal interstitial opacities, similar. Electronically Signed   By: Abigail Miyamoto M.D.   On: 02/22/2019 08:00   Dg Chest Port 1 View  Result Date: 02/21/2019 CLINICAL DATA:  Status post thoracentesis. EXAM: PORTABLE CHEST 1 VIEW COMPARISON:  Earlier today FINDINGS: Right IJ catheter tip projects over the SVC. There is an ET tube with tip above carina. Nasogastric tube is coiled within the stomach. Normal heart size. Decreased right pleural effusion status post thoracentesis. Left-sided loculated hydropneumothorax is unchanged. Bilateral upper lobe opacities are unchanged. Improved aeration to the right base. IMPRESSION: 1. No pneumothorax after right thoracentesis. 2. Improved aeration to the right base. 3. No change in loculated left-sided hydropneumothorax. Electronically Signed   By: Kerby Moors M.D.   On: 02/21/2019 12:41   Dg Chest Port 1 View  Result Date: 02/21/2019 CLINICAL DATA:  Central line placement EXAM: PORTABLE CHEST 1 VIEW COMPARISON:  Radiographs and CT yesterday. FINDINGS: Tip of the right internal jugular central venous catheter in the upper SVC/brachiocephalic confluence. No right pneumothorax. Endotracheal tube tip 5.3 cm from the carina. Partially loculated left hydropneumothorax, similar to exams yesterday. Slight worsening aeration of the right lung with increasing perihilar opacity and pleural effusion. Unchanged heart size and mediastinal contours. IMPRESSION: 1. Tip of the right internal jugular central venous catheter in the upper SVC/brachiocephalic confluence. No right pneumothorax. 2. Partially loculated left hydropneumothorax, similar to exams yesterday. 3. Slight worsening aeration of the right lung with increasing perihilar opacity and pleural effusion. Electronically Signed   By: Keith Rake M.D.   On: 02/21/2019 02:22   Dg Chest [facility] 1 [address]  Result Date: 02/20/2019 CLINICAL DATA:  Pneumothorax. EXAM: PORTABLE CHEST 1  VIEW COMPARISON:  Chest x-ray from earlier same day. Chest CT dated 02/01/2019. FINDINGS: Endotracheal tube remains well positioned with tip approximately 3 cm above the level of the carina. Enteric tube passes below the diaphragm. Heart size and mediastinal contours are stable in size and configuration. The large pneumothorax at the LEFT lung base is stable could bear to the most recent chest x-ray performed today at 12:20 p.m. Slightly improved aeration within the RIGHT lung. Persistent interstitial prominence throughout the RIGHT lung, presumed edema, and probable small RIGHT pleural effusion. IMPRESSION: 1. Stable large pneumothorax at the LEFT lung base. 2. Slightly improved aeration within the RIGHT lung. Persistent interstitial prominence throughout the RIGHT lung, presumed edema, and probable small RIGHT pleural effusion. 3. Support apparatus appears stable in position. Electronically Signed   By: Franki Cabot M.D.   On: 02/20/2019 17:43   Dg Chest Portable 1 View  Result Date: 02/20/2019 CLINICAL DATA:  Endotracheal and enteric tube placement. EXAM: PORTABLE CHEST 1 VIEW COMPARISON:  Chest x-ray from same day at 8:40 a.m. CT chest dated February 01, 2019. FINDINGS: Interval placement of an endotracheal tube with the tip approximately 2.5 cm above the carina. New enteric tube entering the stomach with the tip below the field of view. Worsening consolidation in the right upper and lower lobes. Unchanged opacity in the peripheral left upper lobe. Increasing moderate left basilar pneumothorax, increased since study from earlier today. Skin fold over the peripheral right hemithorax on the second view. No large pleural effusion. No acute osseous abnormality. IMPRESSION: 1. Appropriately positioned endotracheal and enteric tubes. 2. Increasing moderate left basilar pneumothorax. 3. Progressive right upper and lower lobe consolidation. Electronically Signed   By: Titus Dubin M.D.   On: 02/20/2019 12:56   Dg  Chest Port 1 View  Result Date: 02/20/2019 CLINICAL DATA:  Altered mental status. EXAM: PORTABLE CHEST 1 VIEW COMPARISON:  01/28/2019 FINDINGS: Heart size is normal. Bullous disease again seen in the left lung base. Mild opacity is again seen in the left midlung, which may be due to infiltrate or atelectasis. Increased opacity is seen in the inferior right upper lobe and right lower lobe since prior study, suspicious for pneumonia. No definite pleural effusion. IMPRESSION: 1. Increased opacity in inferior right upper lobe and right lower lobe, suspicious for pneumonia. 2. Atelectasis versus infiltrate in the left midlung. 3. Bullous disease in left lung base. Electronically Signed   By: Marlaine Hind M.D.   On: 02/20/2019 09:13   Dg Abd Portable 1v  Result Date: 02/25/2019 CLINICAL DATA:  NG tube placement EXAM: PORTABLE ABDOMEN - 1 VIEW COMPARISON:  February 24, 2019 FINDINGS: The NG tube projects over the gastric antrum/pylorus. The tip is pointed distally. There is some oral contrast within the stomach. The bowel gas pattern is nonspecific with gaseous distention of loops of small bowel and colon scattered throughout the abdomen. There is some oral contrast in the left lower quadrant. IMPRESSION: Enteric tube projects over the gastric antrum/pylorus with the tip pointed distally. Electronically Signed   By: Constance Holster M.D.   On: 02/25/2019 17:55   Dg Swallowing Func-speech Pathology  Result Date: 02/25/2019 Objective Swallowing Evaluation: Type of Study: MBS-Modified Barium Swallow Study  Patient Details Name: Ernesto Carrel MRN: [id number] Date of Birth: [birth year] Today's Date: 02/25/2019 Time: SLP Start Time (ACUTE ONLY): 1310 -SLP Stop Time (ACUTE ONLY): 1330 SLP Time Calculation (min) (ACUTE ONLY): 20 min Past  Medical History: Past Medical History: Diagnosis Date . Anemia  . CHF (congestive heart failure) ([place])  . CVA (cerebral vascular accident) ([place])  . Diabetes mellitus without complication  ([place])  . ESRD (end stage renal disease) on dialysis ([place])  . Gastroparesis due to DM ([place])  . Multiple sclerosis ([place])  . Recurrent left pleural effusion  . Tobacco abuse  Past Surgical History: Past Surgical History: Procedure Laterality Date . BIOPSY  01/31/2019  Procedure: BIOPSY;  Surgeon: Jerene Bears, MD;  Location: [facility] ENDOSCOPY;  Service: Gastroenterology;; . ENTEROSCOPY N/A 01/31/2019  Procedure: ENTEROSCOPY;  Surgeon: Jerene Bears, MD;  Location: [place];  Service: Gastroenterology;  Laterality: N/A; . VIDEO ASSISTED THORACOSCOPY (VATS)/DECORTICATION   HPI: Pt adm from SNF with AMS and hypoglycemia (blood sugar 31). Pt vomited and aspirated and intubated 8/15-8/19. PMH - ESRD on HD, DM, diastolic HF, HTN, multiple sclerosis, CVA with residual lt sided weakness, chronic lt pleural effusion, cdiff, diabetic neuropathy, depression, lt VATS  Subjective: alert, only says "yeah" Assessment / Plan / Recommendation [facility] IP CLINICAL IMPRESSIONS 02/25/2019 Clinical Impression Patient presents with a severe oral and a moderate pharyngeal phase dysphagia. He was only able to transit a small amount of one of the puree boluses tested after a considerable amount of time holding orally as well as significantly reduced lingual manipulation and anterior to posterior transit. Majority of boluses were suctioned out of oral cavity. He exhibited oral delays with nectar liquids and thin liquids as well, but with some improvement. Thin liquids resulted in silent aspiration during the swallow and puree solids and nectar thick liquids resulted in vallecular residuals. Patient currently not safe for PO's and will require alternative means of nutrition. SLP Visit Diagnosis Dysphagia, oropharyngeal phase (R13.12) Attention and concentration deficit following -- Frontal lobe and executive function deficit following -- Impact on safety and function Severe aspiration risk;Risk for inadequate nutrition/hydration   [facility] IP TREATMENT  RECOMMENDATION 02/25/2019 Treatment Recommendations Therapy as outlined in treatment plan below;F/U MBS in --- days (Comment)   Prognosis 02/25/2019 Prognosis for Safe Diet Advancement Guarded Barriers to Reach Goals Cognitive deficits;Time post onset;Severity of deficits Barriers/Prognosis Comment -- [facility] IP DIET RECOMMENDATION 02/25/2019 SLP Diet Recommendations NPO Liquid Administration via -- Medication Administration Via alternative means Compensations -- Postural Changes --   [facility] IP OTHER RECOMMENDATIONS 02/25/2019 Recommended Consults -- Oral Care Recommendations Oral care QID Other Recommendations --   [facility] IP FOLLOW UP RECOMMENDATIONS 02/25/2019 Follow up Recommendations Skilled Nursing facility   [facility] IP FREQUENCY AND DURATION 02/25/2019 Speech Therapy Frequency (ACUTE ONLY) min 2x/week Treatment Duration 2 weeks      [facility] IP ORAL PHASE 02/25/2019 Oral Phase Impaired Oral - Pudding Teaspoon -- Oral - Pudding Cup -- Oral - Honey Teaspoon -- Oral - Honey Cup -- Oral - Nectar Teaspoon Weak lingual manipulation;Delayed oral transit Oral - Nectar Cup Weak lingual manipulation;Reduced posterior propulsion;Delayed oral transit Oral - Nectar Straw NT Oral - Thin Teaspoon Delayed oral transit;Weak lingual manipulation;Reduced posterior propulsion Oral - Thin Cup Delayed oral transit;Weak lingual manipulation;Reduced posterior propulsion Oral - Thin Straw NT Oral - Puree Weak lingual manipulation;Incomplete tongue to palate contact;Reduced posterior propulsion;Delayed oral transit;Decreased bolus cohesion Oral - Mech Soft -- Oral - Regular -- Oral - Multi-Consistency -- Oral - Pill -- Oral Phase - Comment --  [facility] IP PHARYNGEAL PHASE 02/25/2019 Pharyngeal Phase Impaired Pharyngeal- Pudding Teaspoon -- Pharyngeal -- Pharyngeal- Pudding Cup -- Pharyngeal -- Pharyngeal- Honey Teaspoon -- Pharyngeal -- Pharyngeal- Honey Cup -- Pharyngeal --  Pharyngeal- Nectar Teaspoon Delayed swallow initiation-vallecula;Pharyngeal residue -  valleculae Pharyngeal Material does not enter airway Pharyngeal- Nectar Cup Delayed swallow initiation-vallecula;Pharyngeal residue - valleculae Pharyngeal Material does not enter airway Pharyngeal- Nectar Straw NT Pharyngeal -- Pharyngeal- Thin Teaspoon Delayed swallow initiation-vallecula;Penetration/Aspiration during swallow Pharyngeal Material enters airway, passes BELOW cords without attempt by patient to eject out (silent aspiration) Pharyngeal- Thin Cup Delayed swallow initiation-vallecula;Penetration/Aspiration during swallow Pharyngeal Material enters airway, passes BELOW cords without attempt by patient to eject out (silent aspiration) Pharyngeal- Thin Straw NT Pharyngeal -- Pharyngeal- Puree Delayed swallow initiation-vallecula;Pharyngeal residue - valleculae Pharyngeal -- Pharyngeal- Mechanical Soft -- Pharyngeal -- Pharyngeal- Regular -- Pharyngeal -- Pharyngeal- Multi-consistency -- Pharyngeal -- Pharyngeal- Pill -- Pharyngeal -- Pharyngeal Comment --  [facility] IP CERVICAL ESOPHAGEAL PHASE 02/25/2019 Cervical Esophageal Phase WFL Pudding Teaspoon -- Pudding Cup -- Honey Teaspoon -- Honey Cup -- Nectar Teaspoon -- Nectar Cup -- Nectar Straw -- Thin Teaspoon -- Thin Cup -- Thin Straw -- Puree -- Mechanical Soft -- Regular -- Multi-consistency -- Pill -- Cervical Esophageal Comment -- Eliberto, Steurer 02/25/2019, 5:00 PM    Sonia Baller, MA, CCC-SLP Speech Therapy [facility] Acute Rehab Pager: [phone]          [place] Ekg Site Rite  Result Date: 02/20/2019 If Site Rite image not attached, placement could not be confirmed due to current cardiac rhythm.  US Thoracentesis Asp Pleural Space W/img Guide  Result Date: 02/21/2019 INDICATION: Patient with history of respiratory failure, aspiration pneumonitis, encephalopathy, end-stage renal disease, MS, CVA, prior left VATS, right pleural effusion; request made for diagnostic and therapeutic right thoracentesis EXAM: ULTRASOUND GUIDED DIAGNOSTIC AND THERAPEUTIC  RIGHT THORACENTESIS MEDICATIONS: None COMPLICATIONS: None immediate. PROCEDURE: An ultrasound guided thoracentesis was thoroughly discussed with the patient's family and questions answered. The benefits, risks, alternatives and complications were also discussed. The patient's family understands and wishes to proceed with the procedure. Written consent was obtained. Ultrasound was performed to localize and mark an adequate pocket of fluid in the right chest. The area was then prepped and draped in the normal sterile fashion. 1% Lidocaine was used for local anesthesia. Under ultrasound guidance a 6 Fr Safe-T-Centesis catheter was introduced. Thoracentesis was performed. The catheter was removed and a dressing applied. FINDINGS: A total of approximately 400 cc of yellow fluid was removed. Samples were sent to the laboratory as requested by the clinical team. IMPRESSION: Successful ultrasound guided diagnostic and therapeutic right thoracentesis yielding 400 cc of pleural fluid. Read by: Rowe Robert, PA-C Electronically Signed   By: Markus Daft M.D.   On: 02/21/2019 11:28    Microbiology: No results found for this or any previous visit (from the past 240 hour(s)).   Labs: Basic Metabolic Panel: No results for input(s): NA, K, CL, CO2, GLUCOSE, BUN, CREATININE, CALCIUM, MG, PHOS in the last 168 hours. Liver Function Tests: No results for input(s): AST, ALT, ALKPHOS, BILITOT, PROT, ALBUMIN in the last 168 hours. No results for input(s): LIPASE, AMYLASE in the last 168 hours. No results for input(s): AMMONIA in the last 168 hours. CBC: No results for input(s): WBC, NEUTROABS, HGB, HCT, MCV, PLT in the last 168 hours. Cardiac Enzymes: No results for input(s): CKTOTAL, CKMB, CKMBINDEX, TROPONINI in the last 168 hours. BNP: BNP (last 3 results) Recent Labs    02/20/19 0908 02/20/19 1901  BNP 478.0* 783.7*    ProBNP (last 3 results) No results for input(s): PROBNP in the last 8760 hours.  CBG: No  results for input(s): GLUCAP in  the last 168 hours.     Signed:  Domenic Polite MD.  [facility] 03/09/2019, 12:30 PM

## 2019-04-08 DEATH — deceased

## 2020-06-18 IMAGING — MR MRI HEAD WITHOUT CONTRAST
8 series · 48 of 48 positions shown · non-contrast
Comparison: Prior head CT from 02/20/2019.

CLINICAL DATA: Initial evaluation for unexplained altered mental
status. History of prior CVA, and mass.

EXAM:
MRI HEAD WITHOUT CONTRAST
TECHNIQUE: Multiplanar, multiecho pulse sequences of the brain and surrounding
structures were obtained without intravenous contrast.

[Series 5: DWI · axial · 3.0mm · 0.88mm/px · z∈[-41,+89]mm · 12 of 90 slices shown (1 of 4)]
[im 1/90]
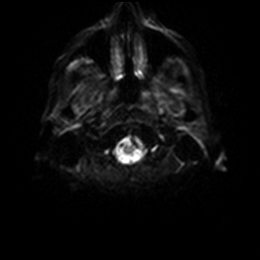
[im 9/90]
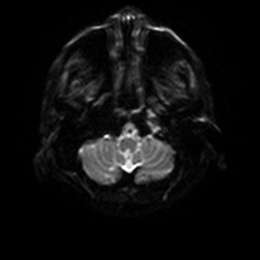
[im 17/90]
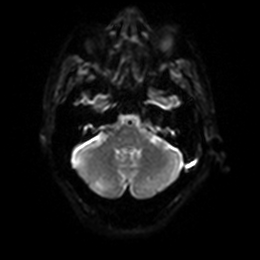
[im 25/90]
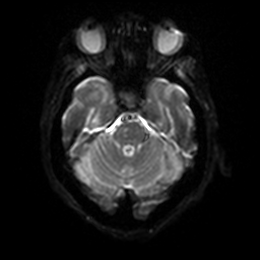
[im 33/90]
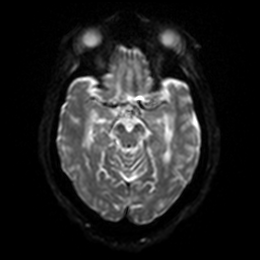
[im 41/90]
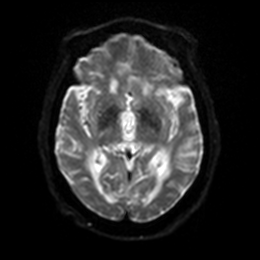
[im 49/90]
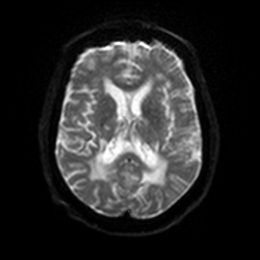
[im 57/90]
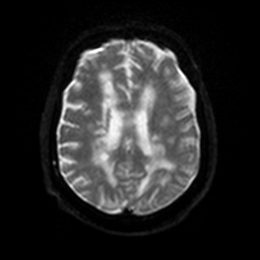
[im 65/90]
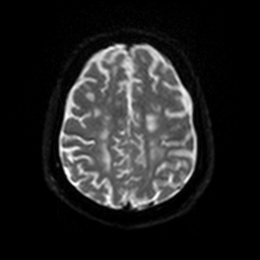
[im 73/90]
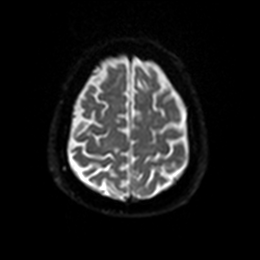
[im 81/90]
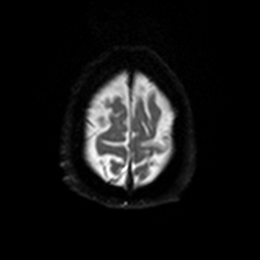
[im 90/90]
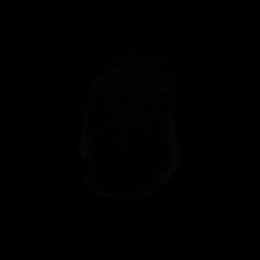

[Series 6: DWI · axial · 3.0mm · 0.88mm/px · z∈[-41,+89]mm · 6 of 45 slices shown (2 of 4)]
[im 1/45]
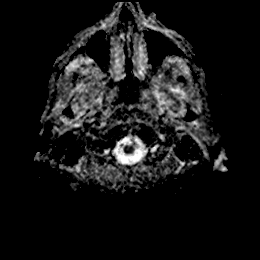
[im 9/45]
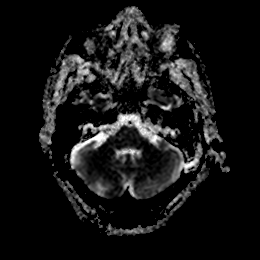
[im 18/45]
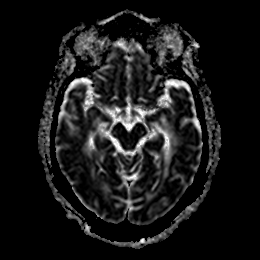
[im 27/45]
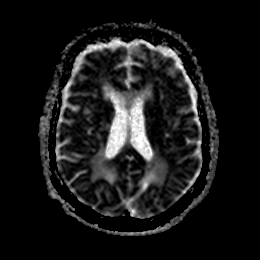
[im 36/45]
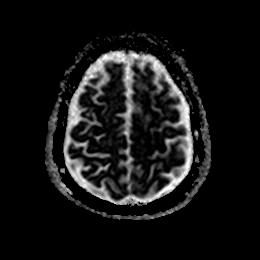
[im 45/45]
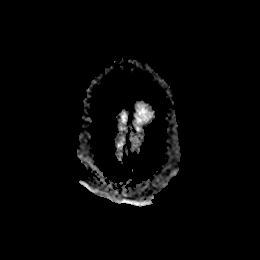

[Series 7: DWI · coronal · 4.0mm · 0.88mm/px · 9 of 64 slices shown (3 of 4)]
[im 1/64]
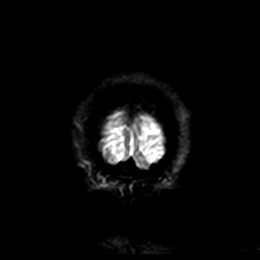
[im 8/64]
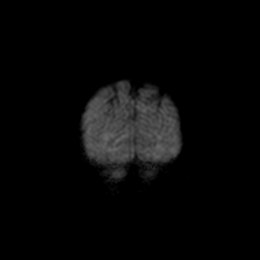
[im 16/64]
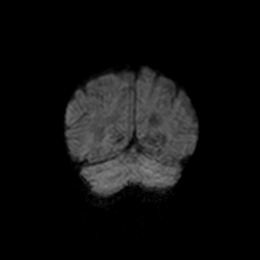
[im 24/64]
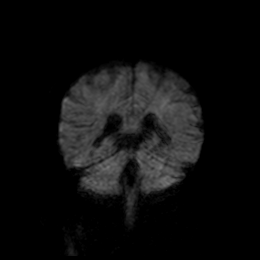
[im 32/64]
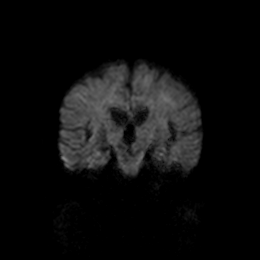
[im 40/64]
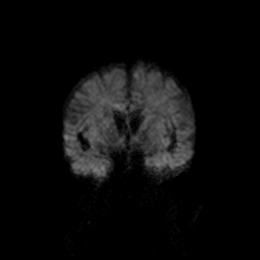
[im 48/64]
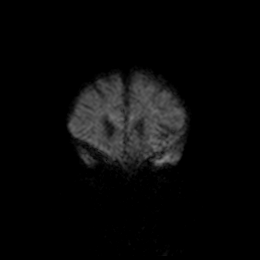
[im 56/64]
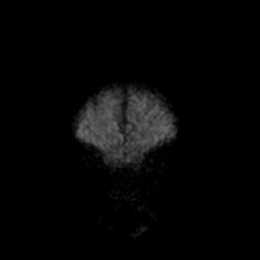
[im 64/64]
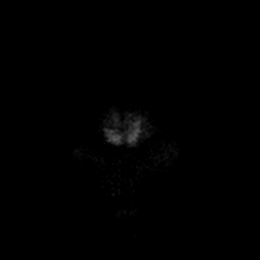

[Series 8: DWI · coronal · 4.0mm · 0.88mm/px · 5 of 32 slices shown (4 of 4)]
[im 1/32]
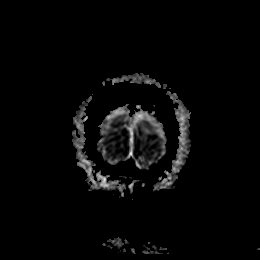
[im 8/32]
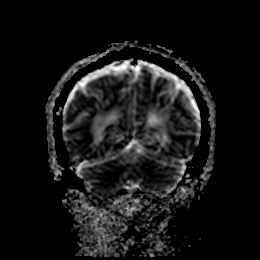
[im 16/32]
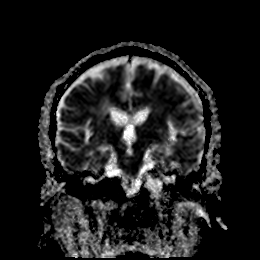
[im 24/32]
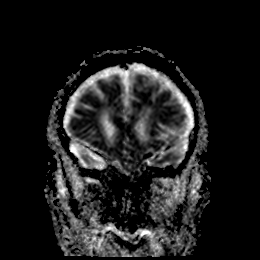
[im 32/32]
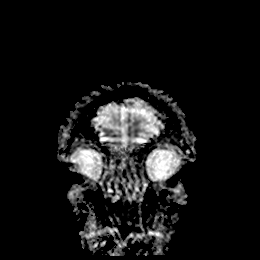

[Series 9: T2 · axial · 5.0mm · 0.72mm/px · z∈[-49,+93]mm · 4 of 25 slices shown]
[im 1/25]
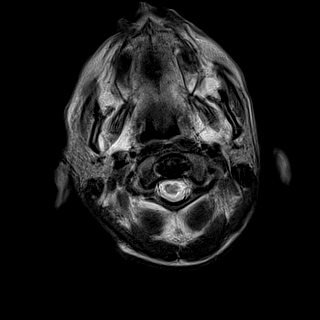
[im 9/25]
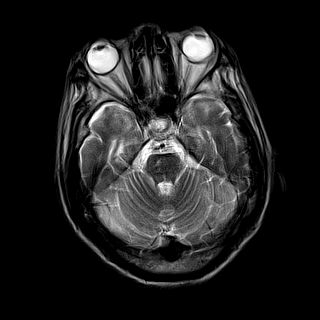
[im 17/25]
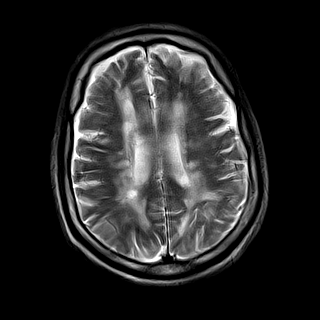
[im 25/25]
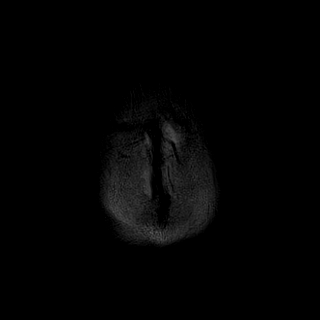

[Series 10: FLAIR · axial · 5.0mm · 0.40mm/px · z∈[-42,+99]mm · 4 of 25 slices shown (1 of 2)]
[im 1/25]
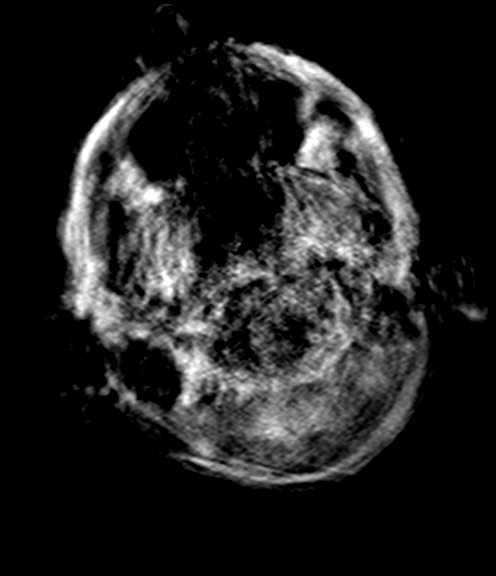
[im 9/25]
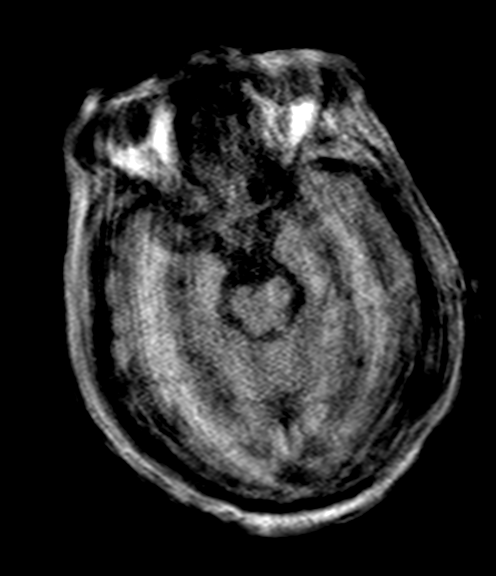
[im 17/25]
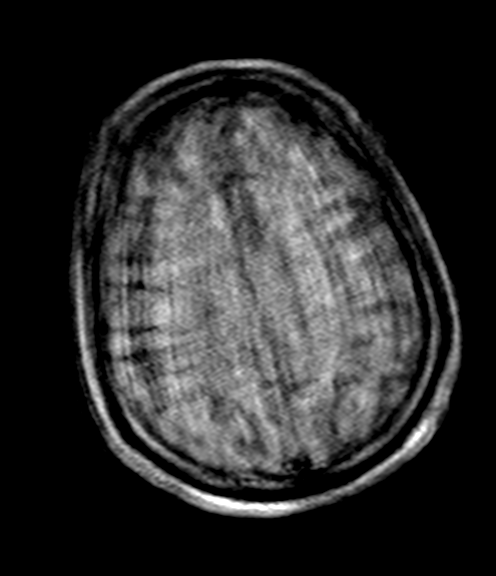
[im 25/25]
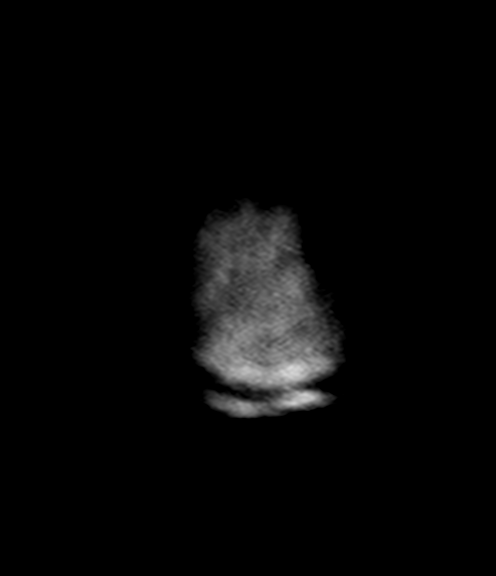

[Series 11: FLAIR · axial · 5.0mm · 0.90mm/px · z∈[-42,+99]mm · 4 of 25 slices shown (2 of 2)]
[im 1/25]
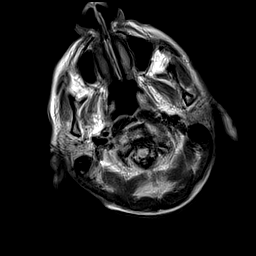
[im 9/25]
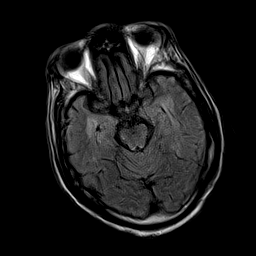
[im 17/25]
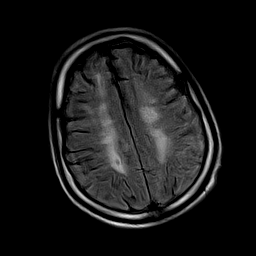
[im 25/25]
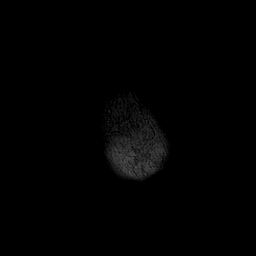

[Series 12: ax hemo · axial · 5.0mm · 0.86mm/px · z∈[-43,+98]mm · 4 of 25 slices shown]
[im 1/25]
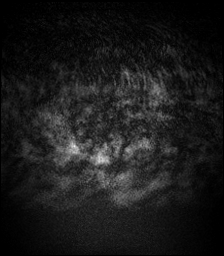
[im 9/25]
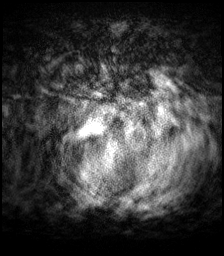
[im 17/25]
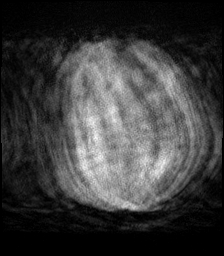
[im 25/25]
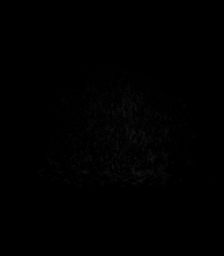

[48 of 48 positions shown; findings below may reference images not displayed]

FINDINGS: Brain: Examination technically limited due to extensive motion
artifact. Additionally, patient was unable to tolerate the full
length of the exam.

Diffuse prominence of the CSF containing spaces compatible with
generalized cerebral atrophy. Patchy and confluent T2/FLAIR
hyperintensity within the periventricular and deep white matter both
cerebral hemispheres likely at least in part reflect sequelae of
chronic multiple sclerosis, although a degree of chronic
microvascular change suspected to be contributory as well. Multiple
superimposed remote lacunar infarcts noted about the bilateral
thalami and pons.

No abnormal foci of restricted diffusion to suggest acute or
subacute ischemia or active demyelination. No obvious areas of
encephalomalacia to suggest prior cortical infarction. No
appreciable acute intracranial hemorrhage on this limited exam.

No mass lesion, midline shift, or mass effect. No hydrocephalus. No
extra-axial fluid collection.

Vascular: Major intracranial vascular flow voids are grossly
maintained at the skull base.

Skull and upper cervical spine: Craniocervical junction grossly
within normal limits. No focal marrow replacing lesion. Scalp soft
tissues demonstrate no acute finding.

Sinuses/Orbits: Globes and orbital soft tissues grossly within
normal limits. Paranasal sinuses are largely clear. Small left
greater than right mastoid effusions noted.

Other: None.
IMPRESSION: 1. Severely limited exam due to extensive motion artifact and
patient's inability to tolerate the full length of the exam.
2. No definite acute intracranial abnormality.
3. Moderately advanced cerebral white matter changes, consistent
with history of multiple sclerosis, although a degree of chronic
small vessel ischemic changes is suspected to be contributory as
well.
4. Few scattered superimposed remote lacunar infarcts about the
bilateral thalami and pons.

## 2020-06-19 IMAGING — DX PORTABLE CHEST - 1 VIEW
1 series · 2 of 2 positions shown · non-contrast
Comparison: 02/23/2019

CLINICAL DATA: Endotracheal tube placement

EXAM:
PORTABLE CHEST 1 VIEW

[Series 1: chest ap · 0.14mm/px · 2 of 2 slices shown]
[im 1/2]
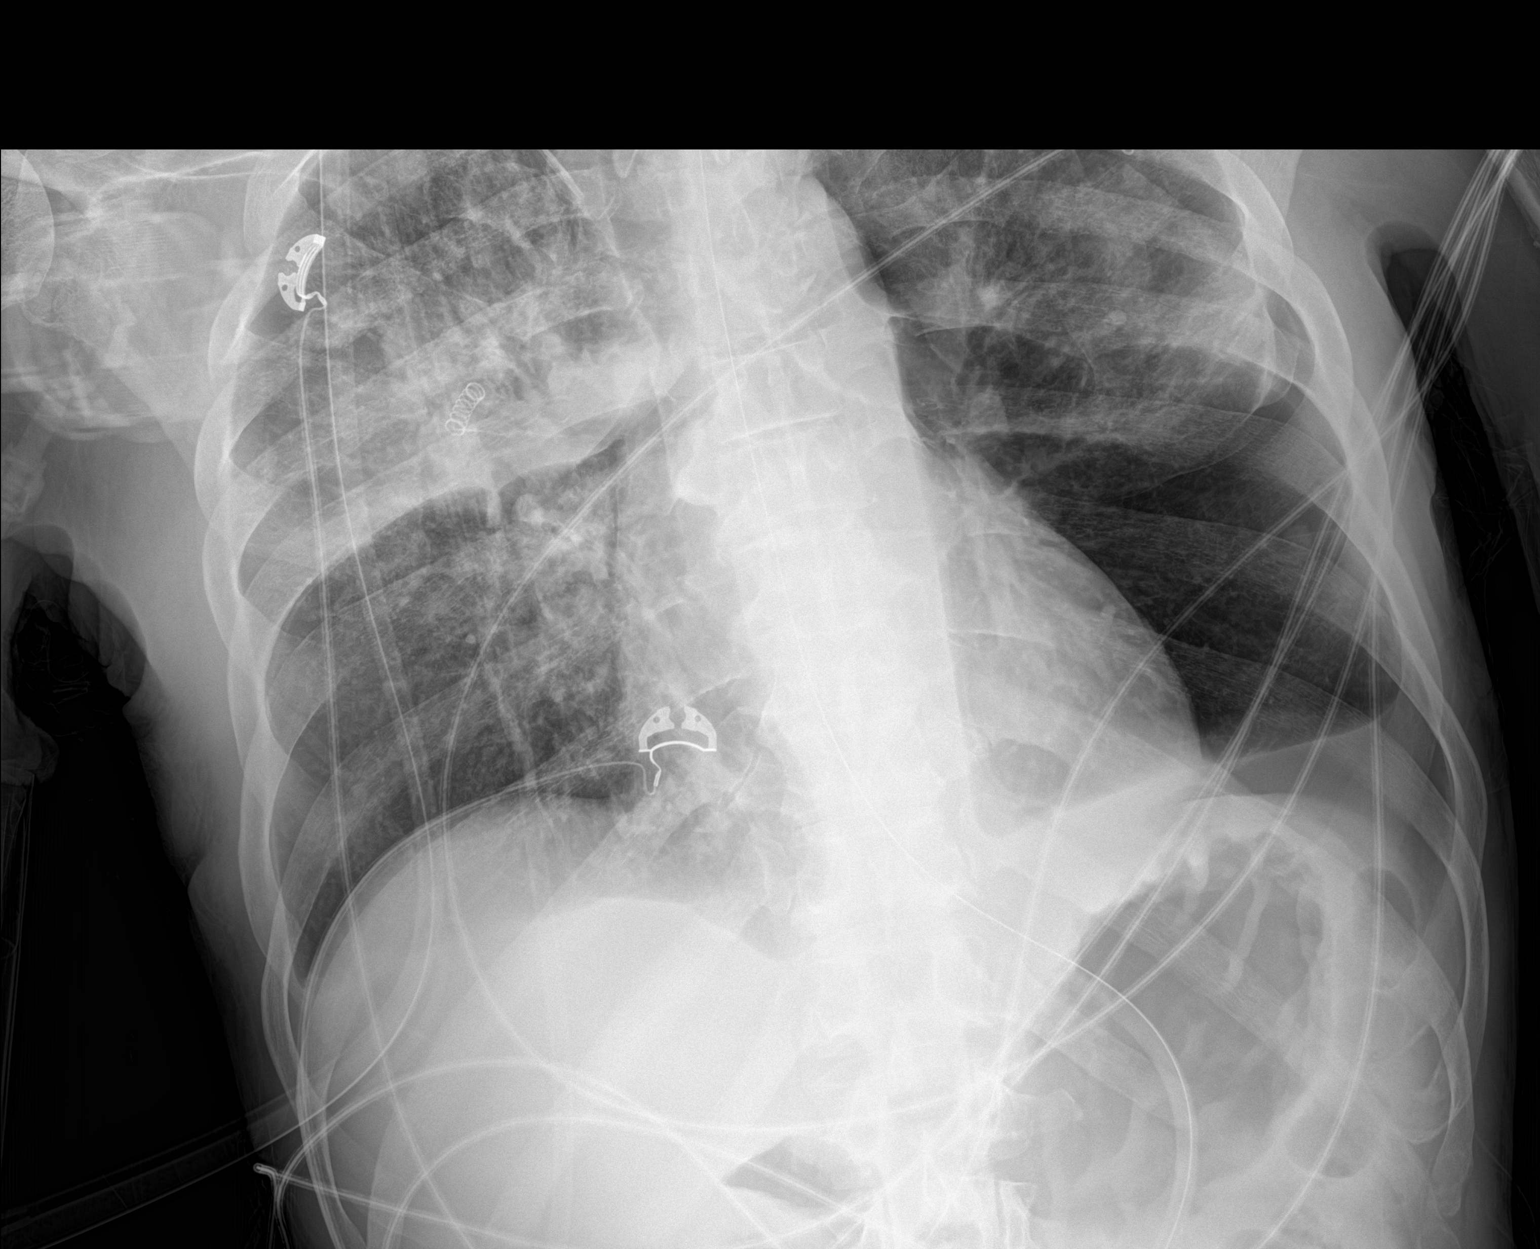
[im 2/2]
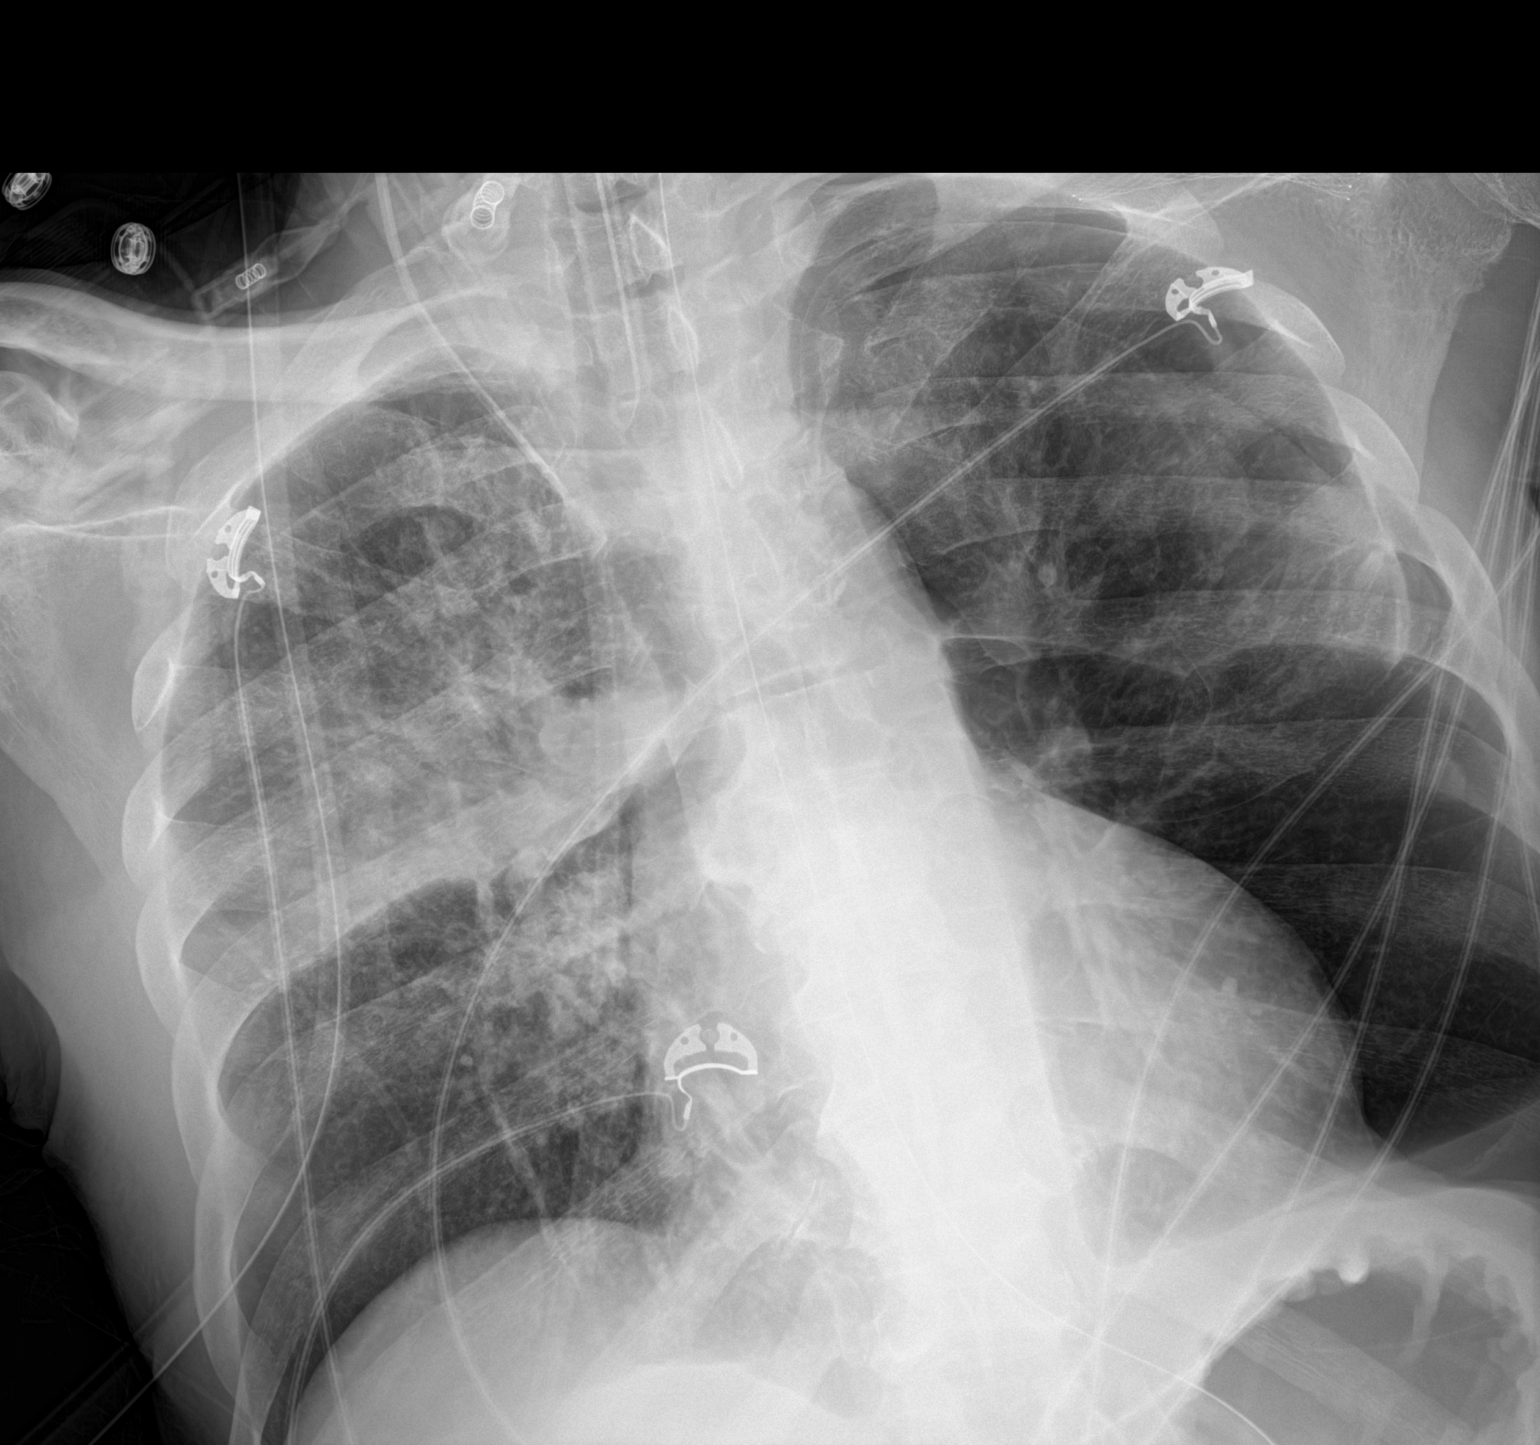

[2 of 2 positions shown; findings below may reference images not displayed]

FINDINGS: Endotracheal tube with the tip 7 cm above the carina. Nasogastric
tube with the tip projecting over the stomach. Right jugular central
venous catheter with the tip projecting over the SVC.

Bilateral mild interstitial thickening. Right upper lobe hazy
airspace disease which is increased compared with the prior exam.
Stable small left hydropneumothorax.
IMPRESSION: 1. Stable left hydropneumothorax measuring less than 10%.
2. Support lines and tubing in satisfactory position.
3. Increased right upper lobe airspace disease concerning for
pneumonia.
